# Patient Record
Sex: Male | Born: 1970
Health system: Southern US, Community
[De-identification: ages and names within clinical notes are randomized; demographics above are authoritative.]

## PROBLEM LIST (undated history)

## (undated) DIAGNOSIS — I639 Cerebral infarction, unspecified: Secondary | ICD-10-CM

## (undated) DIAGNOSIS — M6281 Muscle weakness (generalized): Secondary | ICD-10-CM

## (undated) DIAGNOSIS — E785 Hyperlipidemia, unspecified: Secondary | ICD-10-CM

## (undated) DIAGNOSIS — T7840XA Allergy, unspecified, initial encounter: Secondary | ICD-10-CM

## (undated) DIAGNOSIS — I10 Essential (primary) hypertension: Secondary | ICD-10-CM

## (undated) HISTORY — DX: Hyperlipidemia, unspecified: E78.5

## (undated) HISTORY — PX: KNEE SURGERY: SHX244

## (undated) HISTORY — DX: Muscle weakness (generalized): M62.81

## (undated) HISTORY — DX: Allergy, unspecified, initial encounter: T78.40XA

## (undated) HISTORY — PX: COLONOSCOPY: SHX174

---

## 1998-06-18 ENCOUNTER — Emergency Department (HOSPITAL_COMMUNITY): Admission: EM | Admit: 1998-06-18 | Discharge: 1998-06-18 | Payer: Self-pay | Admitting: Emergency Medicine

## 1998-06-18 ENCOUNTER — Encounter: Payer: Self-pay | Admitting: Emergency Medicine

## 1999-11-26 ENCOUNTER — Emergency Department (HOSPITAL_COMMUNITY): Admission: EM | Admit: 1999-11-26 | Discharge: 1999-11-26 | Payer: Self-pay | Admitting: Emergency Medicine

## 2000-05-31 ENCOUNTER — Emergency Department (HOSPITAL_COMMUNITY): Admission: EM | Admit: 2000-05-31 | Discharge: 2000-05-31 | Payer: Self-pay | Admitting: Emergency Medicine

## 2001-06-05 ENCOUNTER — Emergency Department (HOSPITAL_COMMUNITY): Admission: EM | Admit: 2001-06-05 | Discharge: 2001-06-05 | Payer: Self-pay | Admitting: Emergency Medicine

## 2001-11-29 ENCOUNTER — Emergency Department (HOSPITAL_COMMUNITY): Admission: EM | Admit: 2001-11-29 | Discharge: 2001-11-29 | Payer: Self-pay | Admitting: Emergency Medicine

## 2002-06-25 ENCOUNTER — Emergency Department (HOSPITAL_COMMUNITY): Admission: EM | Admit: 2002-06-25 | Discharge: 2002-06-25 | Payer: Self-pay | Admitting: Emergency Medicine

## 2005-08-17 ENCOUNTER — Emergency Department (HOSPITAL_COMMUNITY): Admission: EM | Admit: 2005-08-17 | Discharge: 2005-08-17 | Payer: Self-pay | Admitting: Emergency Medicine

## 2005-09-19 ENCOUNTER — Emergency Department (HOSPITAL_COMMUNITY): Admission: EM | Admit: 2005-09-19 | Discharge: 2005-09-19 | Payer: Self-pay | Admitting: Emergency Medicine

## 2005-10-02 ENCOUNTER — Encounter: Admission: RE | Admit: 2005-10-02 | Discharge: 2005-12-31 | Payer: Self-pay | Admitting: Neurosurgery

## 2007-04-24 ENCOUNTER — Emergency Department (HOSPITAL_COMMUNITY): Admission: EM | Admit: 2007-04-24 | Discharge: 2007-04-24 | Payer: Self-pay | Admitting: Emergency Medicine

## 2007-05-10 ENCOUNTER — Emergency Department (HOSPITAL_COMMUNITY): Admission: EM | Admit: 2007-05-10 | Discharge: 2007-05-10 | Payer: Self-pay | Admitting: Emergency Medicine

## 2008-03-02 ENCOUNTER — Emergency Department (HOSPITAL_COMMUNITY): Admission: EM | Admit: 2008-03-02 | Discharge: 2008-03-02 | Payer: Self-pay | Admitting: Emergency Medicine

## 2008-07-27 ENCOUNTER — Emergency Department (HOSPITAL_COMMUNITY): Admission: EM | Admit: 2008-07-27 | Discharge: 2008-07-27 | Payer: Self-pay | Admitting: Family Medicine

## 2008-07-30 ENCOUNTER — Emergency Department (HOSPITAL_COMMUNITY): Admission: EM | Admit: 2008-07-30 | Discharge: 2008-07-30 | Payer: Self-pay | Admitting: Family Medicine

## 2008-08-24 ENCOUNTER — Emergency Department (HOSPITAL_COMMUNITY): Admission: EM | Admit: 2008-08-24 | Discharge: 2008-08-24 | Payer: Self-pay | Admitting: Family Medicine

## 2008-10-13 ENCOUNTER — Emergency Department (HOSPITAL_COMMUNITY): Admission: EM | Admit: 2008-10-13 | Discharge: 2008-10-13 | Payer: Self-pay | Admitting: Family Medicine

## 2009-01-11 ENCOUNTER — Emergency Department (HOSPITAL_COMMUNITY): Admission: EM | Admit: 2009-01-11 | Discharge: 2009-01-11 | Payer: Self-pay | Admitting: Family Medicine

## 2009-06-02 ENCOUNTER — Emergency Department (HOSPITAL_COMMUNITY): Admission: EM | Admit: 2009-06-02 | Discharge: 2009-06-02 | Payer: Self-pay | Admitting: Emergency Medicine

## 2009-09-15 ENCOUNTER — Emergency Department (HOSPITAL_COMMUNITY): Admission: EM | Admit: 2009-09-15 | Discharge: 2009-09-15 | Payer: Self-pay | Admitting: Family Medicine

## 2010-02-09 ENCOUNTER — Emergency Department (HOSPITAL_COMMUNITY): Admission: EM | Admit: 2010-02-09 | Discharge: 2010-02-09 | Payer: Self-pay | Admitting: Family Medicine

## 2010-04-24 ENCOUNTER — Emergency Department (HOSPITAL_COMMUNITY): Admission: EM | Admit: 2010-04-24 | Discharge: 2010-04-24 | Payer: Self-pay | Admitting: Emergency Medicine

## 2010-05-13 ENCOUNTER — Emergency Department (HOSPITAL_COMMUNITY): Admission: EM | Admit: 2010-05-13 | Discharge: 2010-05-13 | Payer: Self-pay | Admitting: Family Medicine

## 2010-05-15 ENCOUNTER — Emergency Department (HOSPITAL_COMMUNITY): Admission: EM | Admit: 2010-05-15 | Discharge: 2010-05-15 | Payer: Self-pay | Admitting: Family Medicine

## 2010-06-03 ENCOUNTER — Emergency Department (HOSPITAL_COMMUNITY): Admission: EM | Admit: 2010-06-03 | Discharge: 2010-06-03 | Payer: Self-pay | Admitting: Emergency Medicine

## 2010-06-05 ENCOUNTER — Emergency Department (HOSPITAL_COMMUNITY): Admission: EM | Admit: 2010-06-05 | Discharge: 2010-06-05 | Payer: Self-pay | Admitting: Emergency Medicine

## 2010-06-10 ENCOUNTER — Emergency Department (HOSPITAL_COMMUNITY): Admission: EM | Admit: 2010-06-10 | Discharge: 2010-06-10 | Payer: Self-pay | Admitting: Emergency Medicine

## 2010-06-12 ENCOUNTER — Observation Stay (HOSPITAL_COMMUNITY): Admission: AD | Admit: 2010-06-12 | Discharge: 2010-06-13 | Payer: Self-pay | Admitting: Orthopaedic Surgery

## 2010-11-07 ENCOUNTER — Emergency Department (HOSPITAL_COMMUNITY)
Admission: EM | Admit: 2010-11-07 | Discharge: 2010-11-07 | Disposition: A | Discharge status: Home / Self Care | Payer: Medicare Other | Attending: Emergency Medicine | Admitting: Emergency Medicine

## 2010-11-07 DIAGNOSIS — M25529 Pain in unspecified elbow: Secondary | ICD-10-CM | POA: Insufficient documentation

## 2010-11-07 DIAGNOSIS — M109 Gout, unspecified: Secondary | ICD-10-CM | POA: Insufficient documentation

## 2010-11-07 DIAGNOSIS — M25579 Pain in unspecified ankle and joints of unspecified foot: Secondary | ICD-10-CM | POA: Insufficient documentation

## 2010-11-07 DIAGNOSIS — M25539 Pain in unspecified wrist: Secondary | ICD-10-CM | POA: Insufficient documentation

## 2010-11-07 DIAGNOSIS — R29898 Other symptoms and signs involving the musculoskeletal system: Secondary | ICD-10-CM | POA: Insufficient documentation

## 2010-11-07 DIAGNOSIS — I10 Essential (primary) hypertension: Secondary | ICD-10-CM | POA: Insufficient documentation

## 2010-11-08 LAB — WOUND CULTURE: Culture: NO GROWTH

## 2010-11-08 LAB — BASIC METABOLIC PANEL
BUN: 14 mg/dL (ref 6–23)
CO2: 24 mEq/L (ref 19–32)
Calcium: 9.8 mg/dL (ref 8.4–10.5)
Chloride: 104 mEq/L (ref 96–112)
Creatinine, Ser: 1.32 mg/dL (ref 0.4–1.5)
GFR calc Af Amer: >60 mL/min (ref >60)
GFR calc non Af Amer: >60 mL/min (ref >60)
Glucose, Bld: 94 mg/dL (ref 70–99)
Potassium: 4.4 mEq/L (ref 3.5–5.1)
Sodium: 137 mEq/L (ref 135–145)

## 2010-11-08 LAB — SYNOVIAL CELL COUNT + DIFF, W/ CRYSTALS
Lymphocytes-Synovial Fld: 3 % (ref 0–20)
Monocyte-Macrophage-Synovial Fluid: 2 % — ABNORMAL LOW (ref 50–90)
Neutrophil, Synovial: 95 % — ABNORMAL HIGH (ref 0–25)

## 2010-11-08 LAB — BODY FLUID CULTURE: Culture: NO GROWTH

## 2010-11-08 LAB — CBC
HCT: 44.3 % (ref 39.0–52.0)
Hemoglobin: 14.9 g/dL (ref 13.0–17.0)
MCH: 29.2 pg (ref 26.0–34.0)
MCHC: 33.6 g/dL (ref 30.0–36.0)
MCV: 86.7 fL (ref 78.0–100.0)
Platelets: 525 10*3/uL — ABNORMAL HIGH (ref 150–400)
RBC: 5.11 MIL/uL (ref 4.22–5.81)
RDW: 14 % (ref 11.5–15.5)
WBC: 8.9 10*3/uL (ref 4.0–10.5)

## 2010-11-08 LAB — ANAEROBIC CULTURE

## 2010-11-08 LAB — SURGICAL PCR SCREEN
MRSA, PCR: NEGATIVE
Staphylococcus aureus: NEGATIVE

## 2010-11-08 LAB — PROTIME-INR
INR: 1.04 (ref 0.00–1.49)
Prothrombin Time: 13.8 seconds (ref 11.6–15.2)

## 2010-11-08 LAB — SEDIMENTATION RATE: Sed Rate: 37 mm/hr — ABNORMAL HIGH (ref 0–16)

## 2010-11-08 LAB — DIFFERENTIAL
Basophils Absolute: 0 10*3/uL (ref 0.0–0.1)
Basophils Relative: 0 % (ref 0–1)
Eosinophils Absolute: 0.1 10*3/uL (ref 0.0–0.7)
Eosinophils Relative: 1 % (ref 0–5)
Lymphocytes Relative: 22 % (ref 12–46)
Lymphs Abs: 1.9 10*3/uL (ref 0.7–4.0)
Monocytes Absolute: 0.4 10*3/uL (ref 0.1–1.0)
Monocytes Relative: 5 % (ref 3–12)
Neutro Abs: 6.4 10*3/uL (ref 1.7–7.7)
Neutrophils Relative %: 72 % (ref 43–77)

## 2010-11-08 LAB — GRAM STAIN

## 2010-11-08 LAB — C-REACTIVE PROTEIN: CRP: 9.1 mg/dL — ABNORMAL HIGH (ref <0.6)

## 2010-11-08 LAB — URIC ACID: Uric Acid, Serum: 7.5 mg/dL (ref 4.0–7.8)

## 2010-11-09 LAB — CULTURE, ROUTINE-ABSCESS: Culture: NO GROWTH

## 2011-04-13 ENCOUNTER — Emergency Department (HOSPITAL_COMMUNITY)
Admission: EM | Admit: 2011-04-13 | Discharge: 2011-04-13 | Disposition: A | Discharge status: Home / Self Care | Payer: Medicare Other | Attending: Emergency Medicine | Admitting: Emergency Medicine

## 2011-04-13 DIAGNOSIS — I10 Essential (primary) hypertension: Secondary | ICD-10-CM | POA: Insufficient documentation

## 2011-04-13 DIAGNOSIS — Z79899 Other long term (current) drug therapy: Secondary | ICD-10-CM | POA: Insufficient documentation

## 2011-04-13 DIAGNOSIS — Z862 Personal history of diseases of the blood and blood-forming organs and certain disorders involving the immune mechanism: Secondary | ICD-10-CM | POA: Insufficient documentation

## 2011-04-13 DIAGNOSIS — Z8639 Personal history of other endocrine, nutritional and metabolic disease: Secondary | ICD-10-CM | POA: Insufficient documentation

## 2011-04-13 DIAGNOSIS — L988 Other specified disorders of the skin and subcutaneous tissue: Secondary | ICD-10-CM | POA: Insufficient documentation

## 2011-05-31 LAB — WOUND CULTURE: Culture: NO GROWTH

## 2011-06-08 LAB — URIC ACID: Uric Acid, Serum: 11.2 — ABNORMAL HIGH

## 2011-09-02 DIAGNOSIS — B079 Viral wart, unspecified: Secondary | ICD-10-CM | POA: Diagnosis not present

## 2011-09-04 DIAGNOSIS — D485 Neoplasm of uncertain behavior of skin: Secondary | ICD-10-CM | POA: Diagnosis not present

## 2011-09-13 ENCOUNTER — Emergency Department (HOSPITAL_COMMUNITY)
Admission: EM | Admit: 2011-09-13 | Discharge: 2011-09-13 | Disposition: A | Discharge status: Home / Self Care | Payer: Medicare Other | Attending: Emergency Medicine | Admitting: Emergency Medicine

## 2011-09-13 ENCOUNTER — Encounter (HOSPITAL_COMMUNITY): Payer: Self-pay | Admitting: Emergency Medicine

## 2011-09-13 DIAGNOSIS — M545 Low back pain, unspecified: Secondary | ICD-10-CM | POA: Insufficient documentation

## 2011-09-13 DIAGNOSIS — I10 Essential (primary) hypertension: Secondary | ICD-10-CM | POA: Insufficient documentation

## 2011-09-13 DIAGNOSIS — M1A9XX1 Chronic gout, unspecified, with tophus (tophi): Secondary | ICD-10-CM | POA: Diagnosis not present

## 2011-09-13 HISTORY — DX: Essential (primary) hypertension: I10

## 2011-09-13 MED ORDER — IBUPROFEN 800 MG PO TABS: 800.0000 mg | ORAL_TABLET | Freq: Three times a day (TID) | ORAL | Status: AC

## 2011-09-13 MED ORDER — CYCLOBENZAPRINE HCL 10 MG PO TABS: 10.0000 mg | ORAL_TABLET | Freq: Two times a day (BID) | ORAL | Status: AC | PRN

## 2011-09-13 NOTE — ED Provider Notes (Signed)
History     CSN: [id number]  Arrival date & time 09/13/11  1147   First MD Initiated Contact with Patient 09/13/11 1152      Chief Complaint  Patient presents with  . Back Pain    (Consider location/radiation/quality/duration/timing/severity/associated sxs/prior treatment) HPI Comments: Patient presents complaining of left lumbar back pain that he believes is a spasm.  He has had this before but not recently.  He notes no new injuries or trauma.  He has no radiation of the pain to his legs.  No weakness or numbness in his lower extremities.  No complaints of bladder or bowel incontinence.  No fevers.  Patient has not been taking any pain medications at home for this presents for further management here today.  He denies any associated abdominal pain, nausea, vomiting, changes in bowel habits, shortness of breath or other concerns.  No History of kidney stones  Patient is a 41 y.o. male presenting with back pain. The history is provided by the patient. No language interpreter was used.  Back Pain  This is a recurrent problem. The current episode started yesterday. The problem occurs constantly. The problem has not changed since onset.The pain is associated with no known injury. The pain is present in the lumbar spine. The quality of the pain is described as shooting. The pain does not radiate. The pain is moderate. The symptoms are aggravated by bending and certain positions. Pertinent negatives include no chest pain, no fever, no headaches and no abdominal pain.    Past Medical History  Diagnosis Date  . Hypertension   . Gout     Past Surgical History  Procedure Date  . Knee surgery     No family history on file.  History  Substance Use Topics  . Smoking status: Never Smoker   . Smokeless tobacco: Not on file  . Alcohol Use:       Review of Systems  Constitutional: Negative.  Negative for fever and chills.  HENT: Negative.   Eyes: Negative.  Negative for discharge and  redness.  Respiratory: Negative.  Negative for cough and shortness of breath.   Cardiovascular: Negative.  Negative for chest pain.  Gastrointestinal: Negative.  Negative for nausea, vomiting and abdominal pain.  Genitourinary: Negative.  Negative for hematuria.  Musculoskeletal: Positive for back pain.  Skin: Negative.  Negative for color change and rash.  Neurological: Negative for syncope and headaches.  Hematological: Negative.  Negative for adenopathy.  Psychiatric/Behavioral: Negative.  Negative for confusion.  All other systems reviewed and are negative.    Allergies  Review of patient's allergies indicates no known allergies.  Home Medications  No current outpatient prescriptions on file.  BP 191/129  Pulse 113  Temp(Src) 97.8 F (36.6 C) (Oral)  Resp 18  Ht 6\' 1"  (1.854 m)  Wt 262 lb (118.842 kg)  BMI 34.57 kg/m2  SpO2 97%  Physical Exam  Nursing note and vitals reviewed. Constitutional: He is oriented to person, place, and time. He appears well-developed and well-nourished.  Non-toxic appearance. He does not have a sickly appearance.  HENT:  Head: Normocephalic and atraumatic.  Eyes: Conjunctivae, EOM and lids are normal. Pupils are equal, round, and reactive to light.  Neck: Trachea normal, normal range of motion and full passive range of motion without pain. Neck supple.  Cardiovascular: Normal rate, regular rhythm and normal heart sounds.   Pulmonary/Chest: Effort normal and breath sounds normal. No respiratory distress.  Abdominal: Soft. Normal appearance. He exhibits no distension.  There is no tenderness. There is no rebound and no CVA tenderness.  Musculoskeletal: Normal range of motion.       No focal T-spine or L-spine tenderness on exam.  Patient has mild tenderness to palpation over his left paraspinal muscles in the lumbar region.  Patient has normal ambulation witnessed here in the emergency department.  Neurological: He is alert and oriented to person,  place, and time. He has normal strength.  Skin: Skin is warm, dry and intact. No rash noted.  Psychiatric: He has a normal mood and affect. His behavior is normal. Judgment and thought content normal.    ED Course  Procedures (including critical care time)  Labs Reviewed - No data to display No results found.   No diagnosis found.    MDM  Patient with likely musculoskeletal back pain given his location of his pain and lack of other associated symptoms.  Treat the patient with anti-inflammatories and muscle relaxants.  He has no neurologic deficits.  No bladder or bowel incontinence to indicate further spinal cord involvement.  His pain is not midline and  he has no trauma to be concerned for fractures.  No urinary symptoms to suggest kidney stone.  Patient has known history of high blood pressure as well and states he took his medicine today but has not been taking it consistently since he did not take it yesterday.  Patient does have a primary care physician Dr. Leonor Liv who he has scheduled followup with in March and I have advised the patient that he should followup with his primary care physician regarding his blood pressure.        Nat Christen, MD 09/13/11 [phone]

## 2011-09-13 NOTE — ED Notes (Signed)
Back pain since yesterday, denies injuries

## 2011-10-11 DIAGNOSIS — Z79899 Other long term (current) drug therapy: Secondary | ICD-10-CM | POA: Diagnosis not present

## 2011-11-15 ENCOUNTER — Encounter (HOSPITAL_COMMUNITY): Payer: Self-pay | Admitting: Emergency Medicine

## 2011-11-15 ENCOUNTER — Emergency Department (HOSPITAL_COMMUNITY): Payer: Medicare Other

## 2011-11-15 ENCOUNTER — Emergency Department (HOSPITAL_COMMUNITY)
Admission: EM | Admit: 2011-11-15 | Discharge: 2011-11-15 | Disposition: A | Discharge status: Home Health | Payer: Medicare Other | Attending: Emergency Medicine | Admitting: Emergency Medicine

## 2011-11-15 DIAGNOSIS — I10 Essential (primary) hypertension: Secondary | ICD-10-CM | POA: Insufficient documentation

## 2011-11-15 DIAGNOSIS — S40019A Contusion of unspecified shoulder, initial encounter: Secondary | ICD-10-CM | POA: Diagnosis not present

## 2011-11-15 DIAGNOSIS — IMO0002 Reserved for concepts with insufficient information to code with codable children: Secondary | ICD-10-CM | POA: Insufficient documentation

## 2011-11-15 DIAGNOSIS — S40012A Contusion of left shoulder, initial encounter: Secondary | ICD-10-CM

## 2011-11-15 DIAGNOSIS — M25519 Pain in unspecified shoulder: Secondary | ICD-10-CM | POA: Diagnosis not present

## 2011-11-15 MED ORDER — NAPROXEN 500 MG PO TABS: 500.0000 mg | ORAL_TABLET | Freq: Two times a day (BID) | ORAL | Status: DC | PRN

## 2011-11-15 MED ORDER — IBUPROFEN 800 MG PO TABS
800.0000 mg | ORAL_TABLET | Freq: Once | ORAL | Status: AC
  Administered 2011-11-15: 800 mg via ORAL
  Filled 2011-11-15: qty 1

## 2011-11-15 NOTE — ED Notes (Signed)
Pt presents to department for evaluation of L shoulder pain. Pt states he ran into object in kitchen several days ago. Bruising noted to area. No obvious deformity noted. 8/10 pain at the time, becomes worse with movement. He is alert and oriented x4. No signs of distress noted at present.

## 2011-11-15 NOTE — ED Provider Notes (Signed)
History     CSN: [id number]  Arrival date & time 11/15/11  0846   First MD Initiated Contact with Patient 11/15/11 [phone]      Chief Complaint  Patient presents with  . Shoulder Pain    (Consider location/radiation/quality/duration/timing/severity/associated sxs/prior treatment) HPI Comments: Patient reports he was walking through a dark room two nights ago and ran into the door with his left shoulder.  Reports pain over his anterior shoulder and decreased range of motion secondary to pain.  Pain is exacerbated by moving his arm and palpation.  Patient took 1 200mg  ibuprofen last night without relief. Denies other injury, denies falling or LOC.    Patient is a 41 y.o. male presenting with shoulder pain. The history is provided by the patient.  Shoulder Pain Pertinent negatives include no chest pain, numbness or weakness.    Past Medical History  Diagnosis Date  . Hypertension   . Gout     Past Surgical History  Procedure Date  . Knee surgery     No family history on file.  History  Substance Use Topics  . Smoking status: Never Smoker   . Smokeless tobacco: Not on file  . Alcohol Use: No      Review of Systems  Respiratory: Negative for shortness of breath.   Cardiovascular: Negative for chest pain.  Neurological: Negative for syncope, weakness and numbness.  All other systems reviewed and are negative.    Allergies  Review of patient's allergies indicates no known allergies.  Home Medications   Current Outpatient Rx  Name Route Sig Dispense Refill  . AMLODIPINE-OLMESARTAN 10-40 MG PO TABS Oral Take 1 tablet by mouth daily.    . FEBUXOSTAT 40 MG PO TABS Oral Take 40 mg by mouth daily.      BP 147/106  Pulse 100  Temp(Src) 97.6 F (36.4 C) (Oral)  Resp 18  SpO2 100%  Physical Exam  Nursing note and vitals reviewed. Constitutional: He is oriented to person, place, and time. He appears well-developed and well-nourished.  HENT:  Head: Normocephalic  and atraumatic.  Neck: Neck supple.  Pulmonary/Chest: Effort normal.  Musculoskeletal:       Left shoulder: He exhibits decreased range of motion, tenderness, bony tenderness and pain. He exhibits no crepitus, no deformity and normal pulse.       Arms: Neurological: He is alert and oriented to person, place, and time.    ED Course  Procedures (including critical care time)  Labs Reviewed - No data to display Dg Shoulder Left  11/15/2011  *RADIOLOGY REPORT*  Clinical Data: History of injury and pain.  LEFT SHOULDER - 2+ VIEW  Comparison: None.  Findings: There is no evidence of fracture or dislocation.  There is slight superior subluxation of the distal clavicle in relation to the acromion process.  However no dislocation or abnormal AC joint space widening is seen on the AP images. There appears be very slight degenerative spurring of the inferior aspect of glenoid.  IMPRESSION: No fracture or dislocation evident.  Slight superior subluxation of distal clavicle at the [facility] joint.  This could be chronic or acute. However no widening of the [facility] joint is evident.  No dislocation is seen.  Minimal degenerative spurring.  Original Report Authenticated By: Crawford Givens, M.D.    11:03 AM Patient declines arm sling.  States that he has an old football injury of the left shoulder, this likely explains the slight subluxation on xray.    1. Contusion of left  shoulder       MDM  Patient with contusion of left anterior shoulder following walking into door frame in the dark.  Pt with chronic pain from remote injury.  Xray with slight subluxation distal clavicle at [facility] joint - suspect this is chronic.  Patient underdosed his ibuprofen last night.  Pt declines sling.  D/C home with properly dosed NSAID, pcp follow up.  Patient verbalizes understanding and agrees with plan.          Rise Patience, [place] 11/15/11 1158

## 2011-11-15 NOTE — ED Notes (Signed)
Discharge instructions reviewed with pt; verbalizes understanding.  No questions asked; no further c/o's voiced.  Pt ambulatory to lobby.  NAD noted. 

## 2011-11-15 NOTE — ED Provider Notes (Signed)
Medical screening examination/treatment/procedure(s) were performed by non-physician practitioner and as supervising physician I was immediately available for consultation/collaboration.   Benny Lennert, MD 11/15/11 1444

## 2011-11-15 NOTE — Discharge Instructions (Signed)
Please follow up with your primary care provider if you continue to have pain.  Follow the instructions below.  You may return to the ER at any time for worsening condition or any new symptoms that concern you.   Contusion A contusion is a deep bruise. Contusions happen when an injury causes bleeding under the skin. Signs of bruising include pain, puffiness (swelling), and discolored skin. The contusion may turn blue, purple, or yellow. HOME CARE   Put ice on the injured area.   Put ice in a plastic bag.   Place a towel between your skin and the bag.   Leave the ice on for 15 to 20 minutes, 3 to 4 times a day.   Only take medicine as told by your doctor.   Rest the injured area.   If possible, raise (elevate) the injured area to lessen puffiness.  GET HELP RIGHT AWAY IF:   You have more bruising or puffiness.   You have pain that is getting worse.   Your puffiness or pain is not helped by medicine.  MAKE SURE YOU:   Understand these instructions.   Will watch your condition.   Will get help right away if you are not doing well or get worse.  Document Released: 01/30/2008 Document Revised: 08/02/2011 Document Reviewed: 06/18/2011 [facility] Patient Information 2012 [place], [place].

## 2011-11-15 NOTE — ED Notes (Signed)
Hit left shoulder on door jam on Tuesday hurts to move certain ways now has good pulse and cap refill color can move hand

## 2011-11-19 DIAGNOSIS — T1490XA Injury, unspecified, initial encounter: Secondary | ICD-10-CM | POA: Diagnosis not present

## 2011-12-17 DIAGNOSIS — L0292 Furuncle, unspecified: Secondary | ICD-10-CM | POA: Diagnosis not present

## 2011-12-17 DIAGNOSIS — L0293 Carbuncle, unspecified: Secondary | ICD-10-CM | POA: Diagnosis not present

## 2012-01-03 DIAGNOSIS — D485 Neoplasm of uncertain behavior of skin: Secondary | ICD-10-CM | POA: Diagnosis not present

## 2012-01-31 DIAGNOSIS — D485 Neoplasm of uncertain behavior of skin: Secondary | ICD-10-CM | POA: Diagnosis not present

## 2012-05-10 ENCOUNTER — Emergency Department (HOSPITAL_COMMUNITY)
Admission: EM | Admit: 2012-05-10 | Discharge: 2012-05-11 | Disposition: A | Discharge status: Home / Self Care | Payer: Medicare Other | Attending: Emergency Medicine | Admitting: Emergency Medicine

## 2012-05-10 ENCOUNTER — Encounter (HOSPITAL_COMMUNITY): Payer: Self-pay | Admitting: *Deleted

## 2012-05-10 DIAGNOSIS — S91339A Puncture wound without foreign body, unspecified foot, initial encounter: Secondary | ICD-10-CM

## 2012-05-10 DIAGNOSIS — Y9289 Other specified places as the place of occurrence of the external cause: Secondary | ICD-10-CM | POA: Insufficient documentation

## 2012-05-10 DIAGNOSIS — S91309A Unspecified open wound, unspecified foot, initial encounter: Secondary | ICD-10-CM | POA: Diagnosis not present

## 2012-05-10 DIAGNOSIS — M109 Gout, unspecified: Secondary | ICD-10-CM | POA: Insufficient documentation

## 2012-05-10 DIAGNOSIS — W268XXA Contact with other sharp object(s), not elsewhere classified, initial encounter: Secondary | ICD-10-CM | POA: Insufficient documentation

## 2012-05-10 DIAGNOSIS — I10 Essential (primary) hypertension: Secondary | ICD-10-CM | POA: Insufficient documentation

## 2012-05-10 DIAGNOSIS — Y998 Other external cause status: Secondary | ICD-10-CM | POA: Insufficient documentation

## 2012-05-10 DIAGNOSIS — Y9301 Activity, walking, marching and hiking: Secondary | ICD-10-CM | POA: Insufficient documentation

## 2012-05-10 NOTE — ED Notes (Signed)
The pt has ha puncture wound to the bottom of his rt foot he stepped on a nail in his yard earlier today through his boot

## 2012-05-11 ENCOUNTER — Emergency Department (HOSPITAL_COMMUNITY): Payer: Medicare Other

## 2012-05-11 DIAGNOSIS — S91309A Unspecified open wound, unspecified foot, initial encounter: Secondary | ICD-10-CM | POA: Diagnosis not present

## 2012-05-11 DIAGNOSIS — I10 Essential (primary) hypertension: Secondary | ICD-10-CM | POA: Diagnosis not present

## 2012-05-11 DIAGNOSIS — M109 Gout, unspecified: Secondary | ICD-10-CM | POA: Diagnosis not present

## 2012-05-11 MED ORDER — HYDROCODONE-ACETAMINOPHEN 5-325 MG PO TABS
1.0000 | ORAL_TABLET | Freq: Once | ORAL | Status: AC
  Administered 2012-05-11: 1 via ORAL
  Filled 2012-05-11: qty 1

## 2012-05-11 MED ORDER — HYDROCODONE-ACETAMINOPHEN 5-325 MG PO TABS: 1.0000 | ORAL_TABLET | ORAL | Status: DC | PRN

## 2012-05-11 MED ORDER — AMOXICILLIN-POT CLAVULANATE 875-125 MG PO TABS: 1.0000 | ORAL_TABLET | Freq: Two times a day (BID) | ORAL | Status: DC

## 2012-05-11 MED ORDER — TETANUS-DIPHTH-ACELL PERTUSSIS 5-2.5-18.5 LF-MCG/0.5 IM SUSP
0.5000 mL | Freq: Once | INTRAMUSCULAR | Status: AC
  Administered 2012-05-11: 0.5 mL via INTRAMUSCULAR
  Filled 2012-05-11: qty 0.5

## 2012-05-11 NOTE — ED Provider Notes (Signed)
History     CSN: [id number]  Arrival date & time 05/10/12  2147   First MD Initiated Contact with Patient 05/10/12 2353      Chief Complaint  Patient presents with  . puncture wound    HPI  History provided by the patient. Patient is a 41 year old African American male with history of hypertension and gout who presents with complaints of puncture wound to right foot. Patient states she was walking outside and stepped on a board with a nail through it around 10 or 11 PM. Patient states nail went through his boot and into his right foot. Since that time he has had severe pain and swelling to the area. There was a small amount of bleeding but this stopped shortly. Patient denies any numbness to the foot. Patient is unsure of his last tetanus shot.     Past Medical History  Diagnosis Date  . Hypertension   . Gout     Past Surgical History  Procedure Date  . Knee surgery     No family history on file.  History  Substance Use Topics  . Smoking status: Never Smoker   . Smokeless tobacco: Not on file  . Alcohol Use: No      Review of Systems  Musculoskeletal:       Puncture wound to foot  Neurological: Negative for weakness and numbness.    Allergies  Review of patient's allergies indicates no known allergies.  Home Medications   Current Outpatient Rx  Name Route Sig Dispense Refill  . AMLODIPINE-OLMESARTAN 10-40 MG PO TABS Oral Take 1 tablet by mouth daily.    . FEBUXOSTAT 40 MG PO TABS Oral Take 40 mg by mouth daily.      BP 150/90  Pulse 111  Temp 98.3 F (36.8 C) (Oral)  Resp 18  SpO2 96%  Physical Exam  Nursing note and vitals reviewed. Constitutional: He appears well-developed and well-nourished.  HENT:  Head: Normocephalic.  Cardiovascular: Normal rate and regular rhythm.   Pulmonary/Chest: Effort normal and breath sounds normal.  Abdominal: Soft.  Musculoskeletal:       Right foot: He exhibits tenderness and swelling.       Feet:   Small puncture wound to the sole of the right foot consistent with history of stepping on a nail. There is diffuse tenderness around this area and mild to moderate swelling. No significant erythema of skin. No foreign bodies palpated or seen. No active bleeding. Normal movement in toes and foot. Normal sensation and cap refill in toes.    ED Course  Procedures   Dg Foot Complete Right  05/11/2012  *RADIOLOGY REPORT*  Clinical Data: Puncture wound plantar right foot.  RIGHT FOOT COMPLETE - 3+ VIEW  Comparison: 09/08/2010  Findings: Unchanged hallux valgus deformity and DJD first MTP. Lisfranc joint intact.  No acute fracture or dislocation.  Midfoot DJD.  No radiopaque foreign body identified.  IMPRESSION: First MTP and midfoot DJD.  No acute osseous abnormality or radiopaque foreign body identified.   Original Report Authenticated By: Waneta Martins, M.D.      1. Puncture wound to foot       MDM  12:30 AM patient seen and evaluated. Patient with puncture wound to right mid foot near the heel. Area is swollen and very tender. Will obtain x-ray to rule out any foreign body or bony injury.        Angus Seller, PA 05/11/12 0157

## 2012-05-12 NOTE — ED Provider Notes (Signed)
Medical screening examination/treatment/procedure(s) were performed by non-physician practitioner and as supervising physician I was immediately available for consultation/collaboration.    Brian D Miller, MD 05/12/12 1500 

## 2012-05-17 ENCOUNTER — Encounter (HOSPITAL_COMMUNITY): Payer: Self-pay | Admitting: *Deleted

## 2012-05-17 ENCOUNTER — Emergency Department (HOSPITAL_COMMUNITY)
Admission: EM | Admit: 2012-05-17 | Discharge: 2012-05-17 | Disposition: A | Discharge status: Home / Self Care | Payer: Medicare Other | Attending: Emergency Medicine | Admitting: Emergency Medicine

## 2012-05-17 DIAGNOSIS — I10 Essential (primary) hypertension: Secondary | ICD-10-CM | POA: Insufficient documentation

## 2012-05-17 DIAGNOSIS — S93609A Unspecified sprain of unspecified foot, initial encounter: Secondary | ICD-10-CM | POA: Insufficient documentation

## 2012-05-17 DIAGNOSIS — M109 Gout, unspecified: Secondary | ICD-10-CM | POA: Insufficient documentation

## 2012-05-17 DIAGNOSIS — X58XXXA Exposure to other specified factors, initial encounter: Secondary | ICD-10-CM | POA: Insufficient documentation

## 2012-05-17 DIAGNOSIS — S93601A Unspecified sprain of right foot, initial encounter: Secondary | ICD-10-CM

## 2012-05-17 MED ORDER — TRAMADOL HCL 50 MG PO TABS
50.0000 mg | ORAL_TABLET | Freq: Once | ORAL | Status: AC
  Administered 2012-05-17: 50 mg via ORAL
  Filled 2012-05-17: qty 1

## 2012-05-17 MED ORDER — IBUPROFEN 800 MG PO TABS: 800.0000 mg | ORAL_TABLET | Freq: Three times a day (TID) | ORAL | Status: DC | PRN

## 2012-05-17 MED ORDER — TRAMADOL HCL 50 MG PO TABS: 50.0000 mg | ORAL_TABLET | Freq: Four times a day (QID) | ORAL | Status: DC | PRN

## 2012-05-17 MED ORDER — IBUPROFEN 400 MG PO TABS
800.0000 mg | ORAL_TABLET | Freq: Once | ORAL | Status: AC
  Administered 2012-05-17: 800 mg via ORAL
  Filled 2012-05-17: qty 2

## 2012-05-17 NOTE — ED Notes (Signed)
Patient stepped on a nail in his right foot.  He was seen and tx for same.  He has had increased pain up into his leg.  Patient states he cannot hardly walk due to pain.

## 2012-05-17 NOTE — ED Provider Notes (Signed)
History  This chart was scribed for Randall Numbers, MD by Randall Mccall. The patient was seen in room TR04C/TR04C. Patient's care was started at 1251.     CSN: [id number]  Arrival date & time 05/17/12  1150   First MD Initiated Contact with Patient 05/17/12 1251      Chief Complaint  Patient presents with  . Foot Pain    The history is provided by the patient. No language interpreter was used.    Randall Mccall is a 41 y.o. male who presents to the Emergency Department complaining of constant, moderate pain to dorsum of the foot onset a few days ago. Patient states that he may have twisted his foot. He says that walking aggravates the pain and is concerned the pain is related to gout. Patient is not reporting any other symptoms at this time. Patient was seen here last Saturday after stepping on a nail. Patient was given Tdap, Augmentin and Hydrocone. Patient says that he finished his antibiotic course. His X-ray was negative for foreign bodies or deformities. Patient states that the pain related to his puncture wound is resolved. Patient has a history of gout and HTN. He has never smoked.  Past Medical History  Diagnosis Date  . Hypertension   . Gout     Past Surgical History  Procedure Date  . Knee surgery     History reviewed. No pertinent family history.  History  Substance Use Topics  . Smoking status: Never Smoker   . Smokeless tobacco: Not on file  . Alcohol Use: No      Review of Systems  Constitutional: Negative.   HENT: Negative.   Eyes: Negative.   Respiratory: Negative.   Cardiovascular: Negative.   Gastrointestinal: Negative.   Genitourinary: Negative.   Musculoskeletal: Positive for myalgias.  Neurological: Negative.   Hematological: Negative.   Psychiatric/Behavioral: Negative.     Allergies  Review of patient's allergies indicates no known allergies.  Home Medications   Current Outpatient Rx  Name Route Sig Dispense Refill  .  AMLODIPINE-OLMESARTAN 10-40 MG PO TABS Oral Take 1 tablet by mouth daily.    . AMOXICILLIN-POT CLAVULANATE 875-125 MG PO TABS Oral Take 1 tablet by mouth 2 (two) times daily.    . FEBUXOSTAT 40 MG PO TABS Oral Take 40 mg by mouth daily.    Marland Kitchen HYDROCODONE-ACETAMINOPHEN 5-325 MG PO TABS Oral Take 1 tablet by mouth every 4 (four) hours as needed. For pain.      BP 147/86  Pulse 99  Temp 98.1 F (36.7 C) (Oral)  Resp 18  Ht 6\' 1"  (1.854 m)  Wt 368 lb (166.924 kg)  BMI 48.55 kg/m2  SpO2 96%  Physical Exam GEN: Well-developed, well-nourished male in no acute distress HEENT: Atraumatic, normocephalic. CV: regular rate and rhythm. No murmurs, rubs, or gallops PULM: No respiratory distress.  No crackles, wheezes, or rales. GU: deferred Neuro: No abnormalities of strength or sensation, A and O x 3 MSK: Swelling over the dorsum of the right foot and tenderness to palpation consistent with muscular strain or sprain. Skin: No rashes petechiae, purpura, or jaundice Psych: no abnormality of mood  ED Course  Procedures (including critical care time) DIAGNOSTIC STUDIES: Oxygen Saturation is 96% on room air, adequate by my interpretation.    COORDINATION OF CARE: 12:59pm- Patient informed of current plan for treatment and evaluation and agrees with plan at this time.   Labs Reviewed - No data to display  No results found.  1. Sprain of foot, right       MDM  Patient was seen one week ago after stepping on a nail. Patient was treated with Augmentin at that time. On my evaluation patient has absolutely no pain at the former puncture site as well as no erythema or induration. Patient Randall Mccall complains of pain on the dorsum of his foot  Rather than where the puncture occurred. He describes that when the event occurred he jumped up suddenly and fell down. Based on history appears the patient twisted his foot when this occurred. There is no need for repeat imaging as patient did not have bony  injury previously. Patient was advised to use rice therapy for this. He is also told to use anti-inflammatories and was given a prescription for tramadol if over-the-counter medicines were insufficient. Patient was comfortable with plan and was discharged in good condition.      I personally performed the services described in this documentation, which was scribed in my presence. The recorded information has been reviewed and considered.      Randall Numbers, MD 05/18/12 1106

## 2012-05-17 NOTE — ED Notes (Signed)
Patient seen here last Saturday for a nail in his right. Foot.  States that he took all of his antibiotics.  Her returns today with pain and swelling in his right foot.  States that ambulation is difficult due to the pain.  States that his Gout might be flaring.  Right foot noted to be swollen and red.

## 2012-06-09 DIAGNOSIS — Z79899 Other long term (current) drug therapy: Secondary | ICD-10-CM | POA: Diagnosis not present

## 2012-06-09 DIAGNOSIS — M1A9XX1 Chronic gout, unspecified, with tophus (tophi): Secondary | ICD-10-CM | POA: Diagnosis not present

## 2012-06-09 DIAGNOSIS — M2550 Pain in unspecified joint: Secondary | ICD-10-CM | POA: Diagnosis not present

## 2012-06-09 DIAGNOSIS — Z23 Encounter for immunization: Secondary | ICD-10-CM | POA: Diagnosis not present

## 2012-06-09 DIAGNOSIS — R748 Abnormal levels of other serum enzymes: Secondary | ICD-10-CM | POA: Diagnosis not present

## 2012-06-09 DIAGNOSIS — R7989 Other specified abnormal findings of blood chemistry: Secondary | ICD-10-CM | POA: Diagnosis not present

## 2012-09-16 DIAGNOSIS — L280 Lichen simplex chronicus: Secondary | ICD-10-CM | POA: Diagnosis not present

## 2012-09-16 DIAGNOSIS — L219 Seborrheic dermatitis, unspecified: Secondary | ICD-10-CM | POA: Diagnosis not present

## 2012-12-08 DIAGNOSIS — R748 Abnormal levels of other serum enzymes: Secondary | ICD-10-CM | POA: Diagnosis not present

## 2012-12-08 DIAGNOSIS — Z79899 Other long term (current) drug therapy: Secondary | ICD-10-CM | POA: Diagnosis not present

## 2012-12-08 DIAGNOSIS — M1A9XX1 Chronic gout, unspecified, with tophus (tophi): Secondary | ICD-10-CM | POA: Diagnosis not present

## 2012-12-08 DIAGNOSIS — M2550 Pain in unspecified joint: Secondary | ICD-10-CM | POA: Diagnosis not present

## 2012-12-30 ENCOUNTER — Ambulatory Visit (INDEPENDENT_AMBULATORY_CARE_PROVIDER_SITE_OTHER): Payer: Medicare Other | Admitting: Neurology

## 2012-12-30 ENCOUNTER — Ambulatory Visit (INDEPENDENT_AMBULATORY_CARE_PROVIDER_SITE_OTHER): Payer: Medicare Other

## 2012-12-30 DIAGNOSIS — M2550 Pain in unspecified joint: Secondary | ICD-10-CM

## 2012-12-30 DIAGNOSIS — Z0289 Encounter for other administrative examinations: Secondary | ICD-10-CM

## 2012-12-30 DIAGNOSIS — R748 Abnormal levels of other serum enzymes: Secondary | ICD-10-CM

## 2012-12-30 DIAGNOSIS — M79609 Pain in unspecified limb: Secondary | ICD-10-CM | POA: Diagnosis not present

## 2012-12-30 NOTE — Procedures (Signed)
History: 42 years old right-handed [place] American male, with history of elevated CPK, recent evaluation was 1131, elevated aldolase 14.3. He is on disability now due to learning disorder, he denies muscle weakness, he is able to do 100 pushup each time, without difficulty, walking 2 miles with his dog without difficulty, he denies muscle aching pain  On examination: Bilateral upper and lower extremity motor strength is normal. Deep tendon reflexes were present and symmetric  Nerve conduction study: Right peroneal sensory response was normal. Right peroneal to EDB, tibial motor responses were normal. Right median, ulnar sensory and motor responses were normal  Electromyography:  Selected needle examination was performed at bilateral lower extremity muscles, right upper extremity muscles, right cervical, lumbar spinal paraspinal muscles   Bilateral medial gastrocnemius, increased insertion activity, 2 plus positive waves, normal morphology motor unit potential, with normal recruitment pattern   Needle examination of right tibialis anterior vastus lateralis was normal.  Right biceps: increased insertion activity, 2 plus positive waves, normal morphology motor unit potential, with normal recruitment pattern.  Needle examination of right triceps, deltoid was normal.  Right lumbosacral paraspinal muscles, cervical paraspinal muscles: increased insertion activity spontaneous activity, small spiky motor unit potential, early recruitment patterns.  In conclusion:  This is a mild abnormal study.  There is no electrodiagnostic evidence of large fiber neuropathy, or lumbar radiculopathy. There is evidence of cell membrane irritability involving cervical, lumbar paraspinal muscles, similar involvement of bilateral tibialis anterior, right biceps.  In combination with elevated CPK,  This could indicate an inflammatory process, or other metabolic myopathy. Continue followup with CPK, may consider muscle  biopsy to clarify diagnoses.

## 2013-05-27 DIAGNOSIS — Z79899 Other long term (current) drug therapy: Secondary | ICD-10-CM | POA: Diagnosis not present

## 2013-05-27 DIAGNOSIS — M1A00X1 Idiopathic chronic gout, unspecified site, with tophus (tophi): Secondary | ICD-10-CM | POA: Diagnosis not present

## 2013-05-27 DIAGNOSIS — M2550 Pain in unspecified joint: Secondary | ICD-10-CM | POA: Diagnosis not present

## 2013-05-27 DIAGNOSIS — R748 Abnormal levels of other serum enzymes: Secondary | ICD-10-CM | POA: Diagnosis not present

## 2013-06-30 DIAGNOSIS — L910 Hypertrophic scar: Secondary | ICD-10-CM | POA: Diagnosis not present

## 2013-07-02 ENCOUNTER — Encounter: Payer: Self-pay | Admitting: Neurology

## 2013-07-02 ENCOUNTER — Ambulatory Visit (INDEPENDENT_AMBULATORY_CARE_PROVIDER_SITE_OTHER): Payer: Medicare Other | Admitting: Neurology

## 2013-07-02 VITALS — BP 139/79 | HR 85 | Ht 72.0 in | Wt 250.0 lb

## 2013-07-02 DIAGNOSIS — R5383 Other fatigue: Secondary | ICD-10-CM

## 2013-07-02 DIAGNOSIS — R5381 Other malaise: Secondary | ICD-10-CM

## 2013-07-02 DIAGNOSIS — R748 Abnormal levels of other serum enzymes: Secondary | ICD-10-CM

## 2013-07-02 DIAGNOSIS — R531 Weakness: Secondary | ICD-10-CM

## 2013-07-02 NOTE — Progress Notes (Signed)
[facility]  PATIENT: Randall Mccall DOB: 19-Mar-1971  HISTORICAL [name] is a 42 years old right-handed African American male, he was referred by his primary care physician Dr. Nickola Major for evaluation of elevated CPK, aldolase, with abnormal EMG,  I had first met him in May 2014, at that time, he has elevated CPK 1131, activated aldolase 14.3, he denies significant weakness, but on the needle examination, there is evidence of spontaneous activity, inflammatory changes at the right biceps, bilateral tibialis anterior, and right cervical, lumbosacral paraspinal muscles.  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 on disability around 2005, because of his learning disability,  He now lives alone, does not work, he continues to exercise some, lifting 50 pounds weight without difficulty, doing pushup, walking his dog couple miles a day without any difficulty, he denies significant muscle achy pain, no chest pain, no leg weakness, no gait difficulty, no rash broke out  He denies smoking, drinks occasionally, he denied tea-colored urine  REVIEW OF SYSTEMS: Full 14 system review of systems performed and notable only for  ALLERGIES: No Known Allergies  HOME MEDICATIONS: Outpatient Prescriptions Prior to Visit  Medication Sig Dispense Refill  . amLODipine-olmesartan (AZOR) 10-40 MG per tablet Take 1 tablet by mouth daily.      Marland Kitchen amoxicillin-clavulanate (AUGMENTIN) 875-125 MG per tablet Take 1 tablet by mouth 2 (two) times daily.      . febuxostat (ULORIC) 40 MG tablet Take 40 mg by mouth daily.      Marland Kitchen HYDROcodone-acetaminophen (NORCO/VICODIN) 5-325 MG per tablet Take 1 tablet by mouth every 4 (four) hours as needed. For pain.      Marland Kitchen ibuprofen (ADVIL,MOTRIN) 800 MG tablet Take 1 tablet (800 mg total) by mouth  every 8 (eight) hours as needed for pain.  30 tablet  0  . traMADol (ULTRAM) 50 MG tablet Take 1 tablet (50 mg total) by mouth every 6 (six) hours as needed for pain.  30 tablet  0   No facility-administered medications prior to visit.    PAST MEDICAL HISTORY: Past Medical History  Diagnosis Date  . Hypertension   . Gout   . Muscle weakness     PAST SURGICAL HISTORY: Past Surgical History  Procedure Laterality Date  . Knee surgery      FAMILY HISTORY: No family history on file.  SOCIAL HISTORY:  History   Social History  . Marital Status: Single    Spouse Name: N/A    Number of Children: 0  . Years of Education: 12   Occupational History  .      Disabled   Social History Main Topics  . Smoking status: Never Smoker   . Smokeless tobacco: Never Used  . Alcohol Use: 0.6 oz/week    1 Cans of beer per week  . Drug Use: No  . Sexual Activity: Not on file   Other Topics Concern  . Not on file   Social History Narrative   Patient lives alone and he is single.   Right handed.   Caffeine-    Education- 12 th grade   Disabled.     PHYSICAL EXAM   Filed Vitals:   07/02/13 1143  BP: 139/79  Pulse: 85  Height: 6' (1.829 m)  Weight: 250 lb (113.399 kg)  Not recorded    Body mass index is 33.9 kg/(m^2).   Generalized: In no acute distress  Neck: Supple, no carotid bruits   Cardiac: Regular rate rhythm  Pulmonary: Clear to auscultation bilaterally  Musculoskeletal: No deformity  Neurological examination  Mentation: Alert oriented to time, place, history taking, and causual conversation  Cranial nerve II-XII: Pupils were equal round reactive to light extraocular movements were full, visual field were full on confrontational test. facial sensation and strength were normal. hearing was intact to finger rubbing bilaterally. Uvula tongue midline.  head turning and shoulder shrug and were normal and symmetric.Tongue protrusion into cheek strength was  normal.  Motor: normal tone, bulk and strength.  Sensory: Intact to fine touch, pinprick, preserved vibratory sensation, and proprioception at toes.  Coordination: Normal finger to nose, heel-to-shin bilaterally there was no truncal ataxia  Gait: Rising up from seated position without assistance, normal stance, without trunk ataxia, moderate stride, good arm swing, smooth turning, able to perform tiptoe, and heel walking without difficulty.   Romberg signs: Negative  Deep tendon reflexes: Brachioradialis 2/2, biceps 2/2, triceps 2/2, patellar 2/2, Achilles 2/2, plantar responses were flexor bilaterally.   DIAGNOSTIC DATA (LABS, IMAGING, TESTING) - I reviewed patient records, labs, notes, testing and imaging myself where available.  Lab Results  Component Value Date   WBC 8.9 06/12/2010   HGB 14.9 06/12/2010   HCT 44.3 06/12/2010   MCV 86.7 06/12/2010   PLT 525* 06/12/2010      Component Value Date/Time   NA 137 06/12/2010 1053   K 4.4 06/12/2010 1053   CL 104 06/12/2010 1053   CO2 24 06/12/2010 1053   GLUCOSE 94 06/12/2010 1053   BUN 14 06/12/2010 1053   CREATININE 1.32 06/12/2010 1053   CALCIUM 9.8 06/12/2010 1053   GFRNONAA >60 06/12/2010 1053   GFRAA  Value: >60        The eGFR has been calculated using the MDRD equation. This calculation has not been validated in all clinical situations. eGFR's persistently <60 mL/min signify possible Chronic Kidney Disease. 06/12/2010 1053   ASSESSMENT AND PLAN   42 years old right-handed [place] American male, with elevated CPK, aldolase level, abnormal EMG, which has demonstrated spontaneous activity at cervical, lumbar paraspinal, bilateral tibialis anterior, right biceps, indicating of inflammatory myopathy vs. metabolic myopathy, he denies significant muscle pain, no weakness  1 Will proceed with repeat laboratory evaluation, including CPK, aldolase, 2 if there is continued evidence of significant elevated CPK, may consider  muscle biopsy      Levert Feinstein, M.D. Ph.D.  [facility] [address], [address] [place], [place] [address] [phone]

## 2013-12-09 DIAGNOSIS — R748 Abnormal levels of other serum enzymes: Secondary | ICD-10-CM | POA: Diagnosis not present

## 2013-12-09 DIAGNOSIS — M2550 Pain in unspecified joint: Secondary | ICD-10-CM | POA: Diagnosis not present

## 2013-12-09 DIAGNOSIS — M1A00X1 Idiopathic chronic gout, unspecified site, with tophus (tophi): Secondary | ICD-10-CM | POA: Diagnosis not present

## 2013-12-09 DIAGNOSIS — Z79899 Other long term (current) drug therapy: Secondary | ICD-10-CM | POA: Diagnosis not present

## 2013-12-30 ENCOUNTER — Ambulatory Visit: Payer: Medicare Other | Admitting: Neurology

## 2014-01-11 ENCOUNTER — Encounter (INDEPENDENT_AMBULATORY_CARE_PROVIDER_SITE_OTHER): Payer: Self-pay

## 2014-01-11 ENCOUNTER — Encounter: Payer: Self-pay | Admitting: Neurology

## 2014-01-11 ENCOUNTER — Ambulatory Visit (INDEPENDENT_AMBULATORY_CARE_PROVIDER_SITE_OTHER): Payer: Medicare Other | Admitting: Neurology

## 2014-01-11 VITALS — BP 158/95 | HR 98 | Ht 71.5 in | Wt 253.0 lb

## 2014-01-11 DIAGNOSIS — R5381 Other malaise: Secondary | ICD-10-CM | POA: Diagnosis not present

## 2014-01-11 DIAGNOSIS — R5383 Other fatigue: Secondary | ICD-10-CM

## 2014-01-11 DIAGNOSIS — R748 Abnormal levels of other serum enzymes: Secondary | ICD-10-CM | POA: Diagnosis not present

## 2014-01-11 DIAGNOSIS — R531 Weakness: Secondary | ICD-10-CM

## 2014-01-11 NOTE — Progress Notes (Signed)
[facility]  PATIENT: Randall Mccall DOB: 1971/03/16  HISTORICAL ( initial visit is Nov 2014) Randall Mccall is a 43 years old right-handed African American male, he was referred by his primary care physician Dr. Trudie Reed for evaluation of elevated CPK, aldolase, with abnormal EMG,  I had first met him in May 2014, at that time, he has elevated CPK 1131, elevated aldolase 14.3, he denies significant weakness, but on the needle examination, there is evidence of spontaneous activity, inflammatory changes at the right biceps, bilateral tibialis anterior, and right cervical, lumbosacral paraspinal muscles.  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  REVIEW OF SYSTEMS: Full 14 system review of systems performed and notable only for  ALLERGIES: No Known Allergies  HOME MEDICATIONS: Outpatient Prescriptions Prior to Visit  Medication Sig Dispense Refill  . amLODipine-olmesartan (AZOR) 10-40 MG per tablet Take 1 tablet by mouth daily.      Marland Kitchen amoxicillin-clavulanate (AUGMENTIN) 875-125 MG per tablet Take 1 tablet by mouth 2 (two) times daily.      Marland Kitchen COLCRYS 0.6 MG tablet 0.6 mg.      . febuxostat (ULORIC) 40 MG tablet Take 40 mg  by mouth daily.      . fluocinonide ointment (LIDEX) 0.05 %       . HYDROcodone-acetaminophen (NORCO/VICODIN) 5-325 MG per tablet Take 1 tablet by mouth every 4 (four) hours as needed. For pain.      Marland Kitchen ibuprofen (ADVIL,MOTRIN) 800 MG tablet Take 1 tablet (800 mg total) by mouth every 8 (eight) hours as needed for pain.  30 tablet  0  . traMADol (ULTRAM) 50 MG tablet Take 1 tablet (50 mg total) by mouth every 6 (six) hours as needed for pain.  30 tablet  0   No facility-administered medications prior to visit.    PAST MEDICAL HISTORY: Past Medical History  Diagnosis Date  . Hypertension   . Gout   . Muscle weakness     PAST SURGICAL HISTORY: Past Surgical History  Procedure Laterality Date  . Knee surgery      FAMILY HISTORY: History reviewed. No pertinent family history.  SOCIAL HISTORY:  History   Social History  . Marital Status: Single    Spouse Name: N/A    Number of Children: 0  . Years of Education: 12   Occupational History  .      Disabled   Social History Main Topics  . Smoking status: Never Smoker   . Smokeless tobacco: Never Used  . Alcohol Use: 0.6 oz/week    1 Cans of beer per week  . Drug Use: No  . Sexual Activity: Not on file   Other Topics Concern  . Not on file  Social History Narrative   Patient lives alone and he is single.   Right handed.   Caffeine-    Education- 12 th grade   Disabled.     PHYSICAL EXAM   Filed Vitals:   01/11/14 1316  BP: 158/95  Pulse: 98  Height: 5' 11.5" (1.816 m)  Weight: 253 lb (114.76 kg)    Not recorded    Body mass index is 34.8 kg/(m^2).   Generalized: In no acute distress  Neck: Supple, no carotid bruits   Cardiac: Regular rate rhythm  Pulmonary: Clear to auscultation bilaterally  Musculoskeletal: No deformity  Neurological examination  Mentation: Alert oriented to time, place, history taking, and causual conversation  Cranial nerve II-XII: Pupils were equal round reactive to  light extraocular movements were full, visual field were full on confrontational test. facial sensation and strength were normal. hearing was intact to finger rubbing bilaterally. Uvula tongue midline.  head turning and shoulder shrug and were normal and symmetric.Tongue protrusion into cheek strength was normal.  Motor: normal tone, bulk and strength.  Sensory: Intact to fine touch, pinprick, preserved vibratory sensation, and proprioception at toes.  Coordination: Normal finger to nose, heel-to-shin bilaterally there was no truncal ataxia  Gait: Rising up from seated position without assistance, normal stance, without trunk ataxia, moderate stride, good arm swing, smooth turning, able to perform tiptoe, and heel walking without difficulty.   Romberg signs: Negative  Deep tendon reflexes: Brachioradialis 2/2, biceps 2/2, triceps 2/2, patellar 2/2, Achilles 2/2, plantar responses were flexor bilaterally.   DIAGNOSTIC DATA (LABS, IMAGING, TESTING) - I reviewed patient records, labs, notes, testing and imaging myself where available.  Lab Results  Component Value Date   WBC 8.9 06/12/2010   HGB 14.9 06/12/2010   HCT 44.3 06/12/2010   MCV 86.7 06/12/2010   PLT 525* 06/12/2010      Component Value Date/Time   NA 137 06/12/2010 1053   K 4.4 06/12/2010 1053   CL 104 06/12/2010 1053   CO2 24 06/12/2010 1053   GLUCOSE 94 06/12/2010 1053   BUN 14 06/12/2010 1053   CREATININE 1.32 06/12/2010 1053   CALCIUM 9.8 06/12/2010 1053   GFRNONAA >60 06/12/2010 1053   GFRAA  Value: >60        The eGFR has been calculated using the MDRD equation. This calculation has not been validated in all clinical situations. eGFR's persistently <60 mL/min signify possible Chronic Kidney Disease. 06/12/2010 1053   ASSESSMENT AND PLAN   43 years old right-handed [place] American male, with elevated CPK, aldolase level, abnormal EMG, which has demonstrated spontaneous activity at cervical, lumbar paraspinal,  bilateral tibialis anterior, right biceps, indicating of inflammatory myopathy vs. metabolic myopathy, he denies significant muscle pain, no weakness  1 Will proceed with repeat laboratory evaluation, including CPK, aldolase, 2. ECHO 3. RTC in 3 months. 4. May consider repeat EMG, even muscle biopsy     Marcial Pacas, M.D. Ph.D.  [facility] [address] [place], [place] [address] [phone]

## 2014-01-12 LAB — COMPREHENSIVE METABOLIC PANEL
ALT: 62 IU/L — ABNORMAL HIGH (ref 0–44)
AST: 55 IU/L — ABNORMAL HIGH (ref 0–40)
Albumin/Globulin Ratio: 1.7 (ref 1.1–2.5)
Albumin: 5.3 g/dL (ref 3.5–5.5)
Alkaline Phosphatase: 100 IU/L (ref 39–117)
BUN/Creatinine Ratio: 8 — ABNORMAL LOW (ref 9–20)
BUN: 11 mg/dL (ref 6–24)
CO2: 25 mmol/L (ref 18–29)
Calcium: 10.2 mg/dL (ref 8.7–10.2)
Chloride: 95 mmol/L — ABNORMAL LOW (ref 97–108)
Creatinine, Ser: 1.42 mg/dL — ABNORMAL HIGH (ref 0.76–1.27)
GFR calc Af Amer: 70 mL/min/{1.73_m2} (ref >59)
GFR calc non Af Amer: 60 mL/min/{1.73_m2} (ref >59)
Globulin, Total: 3.2 g/dL (ref 1.5–4.5)
Glucose: 83 mg/dL (ref 65–99)
Potassium: 4.7 mmol/L (ref 3.5–5.2)
Sodium: 137 mmol/L (ref 134–144)
Total Bilirubin: 0.5 mg/dL (ref 0.0–1.2)
Total Protein: 8.5 g/dL (ref 6.0–8.5)

## 2014-01-12 LAB — SEDIMENTATION RATE: Sed Rate: 6 mm/hr (ref 0–15)

## 2014-01-12 LAB — HIV ANTIBODY (ROUTINE TESTING W REFLEX)
HIV 1/O/2 Abs-Index Value: <1 (ref <1.00)
HIV-1/HIV-2 Ab: NONREACTIVE

## 2014-01-12 LAB — CBC WITH DIFFERENTIAL
Basophils Absolute: 0 10*3/uL (ref 0.0–0.2)
Basos: 1 %
Eos: 7 %
Eosinophils Absolute: 0.4 10*3/uL (ref 0.0–0.4)
HCT: 50.1 % (ref 37.5–51.0)
Hemoglobin: 17.3 g/dL (ref 12.6–17.7)
Immature Grans (Abs): 0 10*3/uL (ref 0.0–0.1)
Immature Granulocytes: 0 %
Lymphocytes Absolute: 1.3 10*3/uL (ref 0.7–3.1)
Lymphs: 22 %
MCH: 31.6 pg (ref 26.6–33.0)
MCHC: 34.5 g/dL (ref 31.5–35.7)
MCV: 92 fL (ref 79–97)
Monocytes Absolute: 0.4 10*3/uL (ref 0.1–0.9)
Monocytes: 7 %
Neutrophils Absolute: 3.7 10*3/uL (ref 1.4–7.0)
Neutrophils Relative %: 63 %
Platelets: 316 10*3/uL (ref 150–379)
RBC: 5.47 x10E6/uL (ref 4.14–5.80)
RDW: 13.9 % (ref 12.3–15.4)
WBC: 5.9 10*3/uL (ref 3.4–10.8)

## 2014-01-12 LAB — THYROID PANEL WITH TSH
Free Thyroxine Index: 1.4 (ref 1.2–4.9)
T3 Uptake Ratio: 26 % (ref 24–39)
T4, Total: 5.5 ug/dL (ref 4.5–12.0)
TSH: 2.3 u[IU]/mL (ref 0.450–4.500)

## 2014-01-12 LAB — C-REACTIVE PROTEIN: CRP: 5.5 mg/L — ABNORMAL HIGH (ref 0.0–4.9)

## 2014-01-12 LAB — CK: Total CK: 1004 U/L (ref 24–204)

## 2014-01-12 LAB — VITAMIN B12: Vitamin B-12: 187 pg/mL — ABNORMAL LOW (ref 211–946)

## 2014-01-12 LAB — RPR: RPR: NONREACTIVE

## 2014-01-12 LAB — FOLATE: Folate: 10.9 ng/mL (ref >3.0)

## 2014-01-26 ENCOUNTER — Other Ambulatory Visit (HOSPITAL_COMMUNITY): Payer: Medicare Other

## 2014-04-13 ENCOUNTER — Ambulatory Visit: Payer: Medicare Other | Admitting: Neurology

## 2014-05-21 ENCOUNTER — Ambulatory Visit: Payer: Medicare Other | Admitting: Neurology

## 2014-06-10 DIAGNOSIS — Z23 Encounter for immunization: Secondary | ICD-10-CM | POA: Diagnosis not present

## 2014-06-10 DIAGNOSIS — M2550 Pain in unspecified joint: Secondary | ICD-10-CM | POA: Diagnosis not present

## 2014-06-10 DIAGNOSIS — R5383 Other fatigue: Secondary | ICD-10-CM | POA: Diagnosis not present

## 2014-06-10 DIAGNOSIS — R7989 Other specified abnormal findings of blood chemistry: Secondary | ICD-10-CM | POA: Diagnosis not present

## 2014-06-10 DIAGNOSIS — R748 Abnormal levels of other serum enzymes: Secondary | ICD-10-CM | POA: Diagnosis not present

## 2014-06-10 DIAGNOSIS — M1A9XX1 Chronic gout, unspecified, with tophus (tophi): Secondary | ICD-10-CM | POA: Diagnosis not present

## 2015-08-10 ENCOUNTER — Encounter: Payer: Self-pay | Admitting: Neurology

## 2015-08-10 ENCOUNTER — Ambulatory Visit (INDEPENDENT_AMBULATORY_CARE_PROVIDER_SITE_OTHER): Payer: Medicare Other | Admitting: Neurology

## 2015-08-10 VITALS — BP 171/104 | HR 90 | Ht 71.5 in | Wt 258.0 lb

## 2015-08-10 DIAGNOSIS — E538 Deficiency of other specified B group vitamins: Secondary | ICD-10-CM | POA: Diagnosis not present

## 2015-08-10 DIAGNOSIS — R531 Weakness: Secondary | ICD-10-CM | POA: Diagnosis not present

## 2015-08-10 DIAGNOSIS — R748 Abnormal levels of other serum enzymes: Secondary | ICD-10-CM | POA: Diagnosis not present

## 2015-08-10 DIAGNOSIS — E559 Vitamin D deficiency, unspecified: Secondary | ICD-10-CM | POA: Diagnosis not present

## 2015-08-10 NOTE — Progress Notes (Signed)
[facility]  PATIENT: Randall Mccall DOB: [birth year]  HISTORICAL ( initial visit is Nov 2014) Mr. Weathington is a 44 years old right-handed African American male, he was referred by his primary care physician Dr. Trudie Reed for evaluation of elevated CPK, aldolase, with abnormal EMG in May 2015.  I had first met him in May 2014, at that time, he has elevated CPK 1131, elevated aldolase 14.3, he denies significant weakness, but on the needle examination, there is evidence of spontaneous activity, inflammatory changes at the right biceps, bilateral tibialis anterior, and right cervical, lumbosacral paraspinal muscles.  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  UPDATE Aug 10 2015: He has lost follow-up since last visit in May 2015, he denies significant weakness, no cardiac complaints,  I reviewed lab in 2015, elevated CPK 1005, low b12 187, CMP showed creat 1.4, normal CBC.  He he walked to clinic today, he denies gait difficulty, complains of left hand numbness, weakness, stiffness,  REVIEW OF SYSTEMS: Full 14 system review of systems performed and notable only for as above  ALLERGIES: No Known Allergies  HOME  MEDICATIONS: Outpatient Prescriptions Prior to Visit  Medication Sig Dispense Refill  . amLODipine-olmesartan (AZOR) 10-40 MG per tablet Take 1 tablet by mouth daily.    Marland Kitchen amoxicillin-clavulanate (AUGMENTIN) 875-125 MG per tablet Take 1 tablet by mouth 2 (two) times daily.    Marland Kitchen COLCRYS 0.6 MG tablet 0.6 mg.    . febuxostat (ULORIC) 40 MG tablet Take 40 mg by mouth daily.    . fluocinonide ointment (LIDEX) 0.05 %     . HYDROcodone-acetaminophen (NORCO/VICODIN) 5-325 MG per tablet Take 1 tablet by mouth every 4 (four) hours as needed. For pain.    Marland Kitchen ibuprofen (ADVIL,MOTRIN) 800 MG tablet Take 1 tablet (800 mg total) by mouth every 8 (eight) hours as needed for pain. 30 tablet 0  . traMADol (ULTRAM) 50 MG tablet Take 1 tablet (50 mg total) by mouth every 6 (six) hours as needed for pain. 30 tablet 0   No facility-administered medications prior to visit.    PAST MEDICAL HISTORY: Past Medical History  Diagnosis Date  . Hypertension   . Gout   . Muscle weakness     PAST SURGICAL HISTORY: Past Surgical History  Procedure Laterality Date  . Knee surgery      FAMILY HISTORY: No family history on file.  SOCIAL HISTORY:  Social History   Social History  . Marital Status: Single    Spouse Name: N/A  . Number of Children: 0  . Years of Education: 12   Occupational History  .      Disabled   Social History Main Topics  . Smoking status: Never Smoker   .  Smokeless tobacco: Never Used  . Alcohol Use: 0.6 oz/week    1 Cans of beer per week  . Drug Use: No  . Sexual Activity: Not on file   Other Topics Concern  . Not on file   Social History Narrative   Patient lives alone and he is single.   Right handed.   Caffeine-    Education- 12 th grade   Disabled.     PHYSICAL EXAM   Filed Vitals:   08/10/15 1218  BP: 171/104  Pulse: 90  Height: 5' 11.5" (1.816 m)  Weight: 258 lb (117.028 kg)    Not recorded      Body mass index is 35.49 kg/(m^2).   Generalized:  In no acute distress  Neck: Supple, no carotid bruits   Cardiac: Regular rate rhythm  Pulmonary: Clear to auscultation bilaterally  Musculoskeletal: No deformity  Neurological examination  Mentation: Alert oriented to time, place, history taking, and causual conversation  Cranial nerve II-XII: Pupils were equal round reactive to light extraocular movements were full, visual field were full on confrontational test. facial sensation and strength were normal. hearing was intact to finger rubbing bilaterally. Uvula tongue midline.  head turning and shoulder shrug and were normal and symmetric.Tongue protrusion into cheek strength was normal.  Motor: normal tone, bulk and strength.  Sensory: Intact to fine touch, pinprick, preserved vibratory sensation, and proprioception at toes.  Coordination: Normal finger to nose, heel-to-shin bilaterally there was no truncal ataxia  Gait: Rising up from seated position without assistance, normal stance, without trunk ataxia, moderate stride, good arm swing, smooth turning, able to perform tiptoe, and heel walking without difficulty.   Romberg signs: Negative  Deep tendon reflexes: Brachioradialis 2/2, biceps 2/2, triceps 2/2, patellar 2/2, Achilles 2/2, plantar responses were flexor bilaterally.   DIAGNOSTIC DATA (LABS, IMAGING, TESTING) - I reviewed patient records, labs, notes, testing and imaging myself where available.  Lab Results  Component Value Date   WBC 5.9 01/11/2014   HGB 17.3 01/11/2014   HCT 50.1 01/11/2014   MCV 92 01/11/2014   PLT 316 01/11/2014      Component Value Date/Time   NA 137 01/11/2014 1430   NA 137 06/12/2010 1053   K 4.7 01/11/2014 1430   CL 95* 01/11/2014 1430   CO2 25 01/11/2014 1430   GLUCOSE 83 01/11/2014 1430   GLUCOSE 94 06/12/2010 1053   BUN 11 01/11/2014 1430   BUN 14 06/12/2010 1053   CREATININE 1.42* 01/11/2014 1430   CALCIUM 10.2 01/11/2014 1430   PROT 8.5 01/11/2014 1430   ALBUMIN 5.3  01/11/2014 1430   AST 55* 01/11/2014 1430   ALT 62* 01/11/2014 1430   ALKPHOS 100 01/11/2014 1430   BILITOT 0.5 01/11/2014 1430   GFRNONAA 60 01/11/2014 1430   GFRAA 70 01/11/2014 1430   ASSESSMENT AND PLAN   44 years old right-handed [place] American male, with elevated CPK, aldolase level, abnormal EMG, which has demonstrated spontaneous activity at cervical, lumbar paraspinal, bilateral tibialis anterior, right biceps, indicating of inflammatory myopathy vs. metabolic myopathy, he denies significant muscle pain, no weakness  Elevated CPK  In the setting of strenuous exercise, abnormal kidney function, with creatinine of 1.4  Repeat laboratory evaluations,   EMG nerve conduction study  Vitamin B12 deficiency  Repeat  B12 level, B12 was 187 in May 2015  Potential B12 supplement     Marcial Pacas, M.D. Ph.D.  [facility] [address] [place], [place] [address] [phone]

## 2015-08-11 ENCOUNTER — Other Ambulatory Visit: Payer: Self-pay | Admitting: *Deleted

## 2015-08-11 ENCOUNTER — Encounter: Payer: Self-pay | Admitting: *Deleted

## 2015-08-11 ENCOUNTER — Telehealth: Payer: Self-pay | Admitting: Neurology

## 2015-08-11 DIAGNOSIS — E538 Deficiency of other specified B group vitamins: Secondary | ICD-10-CM

## 2015-08-11 MED ORDER — CYANOCOBALAMIN 1000 MCG/ML IJ SOLN
1000.0000 ug | INTRAMUSCULAR | Status: DC
  Administered 2015-08-23 – 2015-08-24 (×2): 1000 ug via INTRAMUSCULAR

## 2015-08-11 MED ORDER — CYANOCOBALAMIN 1000 MCG/ML IJ SOLN
1000.0000 ug | Freq: Every day | INTRAMUSCULAR | Status: AC
  Administered 2015-08-15 – 2015-08-18 (×4): 1000 ug via INTRAMUSCULAR

## 2015-08-11 MED ORDER — CYANOCOBALAMIN 1000 MCG/ML IJ SOLN: 1000.0000 ug | INTRAMUSCULAR | Status: AC

## 2015-08-11 NOTE — Telephone Encounter (Signed)
Spoke to patient - states he spoke to the pharmacist and was given a 5000 unit tablet and he is taking one daily.

## 2015-08-11 NOTE — Telephone Encounter (Signed)
Spoke to patient - he is aware of results.  He will add oral D3 as directed.  He would like to start his B12 injections next Monday.  Orders have been placed in Epic.

## 2015-08-11 NOTE — Telephone Encounter (Signed)
Mailbox full - no answer.  He should be taking OTC oral Vitamin D3 as directed below.

## 2015-08-11 NOTE — Telephone Encounter (Signed)
Patient called back after just speaking with Sharyn Lull, requests a call back.

## 2015-08-11 NOTE — Telephone Encounter (Signed)
Please call patient, lab showed low vit B12. 169, he needs to come back to office for IM supplement, 1000 g once daily for one week, once week for one month, then monthly.  He also need vitamin D 3 supplement, 1000 units over-the-counter, 4 tablets each day

## 2015-08-11 NOTE — Telephone Encounter (Signed)
Patient is calling. He needs to know the dosage for Vitamin D3. I advised that he needs to take as directed as in the notes below but he wants a returned call.

## 2015-08-11 NOTE — Telephone Encounter (Signed)
Attempted to call patient back - no answer - mailbox full.

## 2015-08-13 LAB — THYROID PANEL WITH TSH
Free Thyroxine Index: 1.3 (ref 1.2–4.9)
T3 Uptake Ratio: 25 % (ref 24–39)
T4, Total: 5.3 ug/dL (ref 4.5–12.0)
TSH: 1.89 u[IU]/mL (ref 0.450–4.500)

## 2015-08-13 LAB — FOLATE: Folate: 13 ng/mL (ref >3.0)

## 2015-08-13 LAB — COMPREHENSIVE METABOLIC PANEL
ALT: 36 IU/L (ref 0–44)
AST: 45 IU/L — ABNORMAL HIGH (ref 0–40)
Albumin/Globulin Ratio: 1.5 (ref 1.1–2.5)
Albumin: 4.8 g/dL (ref 3.5–5.5)
Alkaline Phosphatase: 79 IU/L (ref 39–117)
BUN/Creatinine Ratio: 12 (ref 9–20)
BUN: 14 mg/dL (ref 6–24)
Bilirubin Total: 0.4 mg/dL (ref 0.0–1.2)
CO2: 24 mmol/L (ref 18–29)
Calcium: 10.2 mg/dL (ref 8.7–10.2)
Chloride: 96 mmol/L (ref 96–106)
Creatinine, Ser: 1.18 mg/dL (ref 0.76–1.27)
GFR calc Af Amer: 87 mL/min/{1.73_m2} (ref >59)
GFR calc non Af Amer: 75 mL/min/{1.73_m2} (ref >59)
Globulin, Total: 3.3 g/dL (ref 1.5–4.5)
Glucose: 97 mg/dL (ref 65–99)
Potassium: 5.1 mmol/L (ref 3.5–5.2)
Sodium: 139 mmol/L (ref 134–144)
Total Protein: 8.1 g/dL (ref 6.0–8.5)

## 2015-08-13 LAB — ALDOLASE: Aldolase: 9 U/L (ref 3.3–10.3)

## 2015-08-13 LAB — C-REACTIVE PROTEIN: CRP: 2.9 mg/L (ref 0.0–4.9)

## 2015-08-13 LAB — VITAMIN D 25 HYDROXY (VIT D DEFICIENCY, FRACTURES): Vit D, 25-Hydroxy: 5.4 ng/mL — ABNORMAL LOW (ref 30.0–100.0)

## 2015-08-13 LAB — METHYLMALONIC ACID, SERUM: Methylmalonic Acid: 427 nmol/L — ABNORMAL HIGH (ref 0–378)

## 2015-08-13 LAB — VITAMIN B12: Vitamin B-12: 169 pg/mL — ABNORMAL LOW (ref 211–946)

## 2015-08-15 ENCOUNTER — Ambulatory Visit (INDEPENDENT_AMBULATORY_CARE_PROVIDER_SITE_OTHER): Payer: Medicare Other

## 2015-08-15 VITALS — BP 191/123 | HR 85 | Resp 20

## 2015-08-15 DIAGNOSIS — E538 Deficiency of other specified B group vitamins: Secondary | ICD-10-CM | POA: Diagnosis not present

## 2015-08-16 ENCOUNTER — Ambulatory Visit (INDEPENDENT_AMBULATORY_CARE_PROVIDER_SITE_OTHER): Payer: Medicare Other | Admitting: *Deleted

## 2015-08-16 DIAGNOSIS — E538 Deficiency of other specified B group vitamins: Secondary | ICD-10-CM | POA: Diagnosis not present

## 2015-08-17 ENCOUNTER — Ambulatory Visit (INDEPENDENT_AMBULATORY_CARE_PROVIDER_SITE_OTHER): Payer: Medicare Other

## 2015-08-17 DIAGNOSIS — E538 Deficiency of other specified B group vitamins: Secondary | ICD-10-CM

## 2015-08-18 ENCOUNTER — Ambulatory Visit (INDEPENDENT_AMBULATORY_CARE_PROVIDER_SITE_OTHER): Payer: Medicare Other | Admitting: *Deleted

## 2015-08-18 DIAGNOSIS — E538 Deficiency of other specified B group vitamins: Secondary | ICD-10-CM

## 2015-08-18 NOTE — Progress Notes (Signed)
B12 1011mcg/ml IM given in right deltoid. Cleaned with alcohol wipe prior to injection. Applied band-aid. Pt stable.

## 2015-08-23 ENCOUNTER — Ambulatory Visit (INDEPENDENT_AMBULATORY_CARE_PROVIDER_SITE_OTHER): Payer: Medicare Other

## 2015-08-23 DIAGNOSIS — E538 Deficiency of other specified B group vitamins: Secondary | ICD-10-CM | POA: Diagnosis not present

## 2015-08-24 ENCOUNTER — Encounter (INDEPENDENT_AMBULATORY_CARE_PROVIDER_SITE_OTHER): Payer: Medicare Other | Admitting: *Deleted

## 2015-08-24 ENCOUNTER — Ambulatory Visit (INDEPENDENT_AMBULATORY_CARE_PROVIDER_SITE_OTHER): Payer: Medicare Other | Admitting: *Deleted

## 2015-08-24 DIAGNOSIS — E538 Deficiency of other specified B group vitamins: Secondary | ICD-10-CM

## 2015-08-24 DIAGNOSIS — Z0289 Encounter for other administrative examinations: Secondary | ICD-10-CM

## 2015-08-24 MED ORDER — CYANOCOBALAMIN 1000 MCG/ML IJ SOLN
1000.0000 ug | Freq: Once | INTRAMUSCULAR | Status: AC
  Administered 2015-08-24: 1000 ug via INTRAMUSCULAR

## 2015-08-24 NOTE — Progress Notes (Signed)
Pt here for B 12 injection.  Under aseptic technique cyanocobalamin 1000mcg/1ml IM given R deltoid.  Tolerated well.  Bandaid applied.  

## 2015-08-24 NOTE — Progress Notes (Signed)
Pt here for B 12 injection.  Under aseptic technique cyanocobalamin 1043mcg/1ml IM given  R deltoid .  Tolerated well.  Bandaid applied.   This was his 6th injection.  He will be in tomorrow, and then will be weekly.  He made appt for next Tuesday, this will need to be changed to Friday or other day better for him. This encounter was created in error - please disregard. This encounter was created in error - please disregard.

## 2015-08-25 ENCOUNTER — Ambulatory Visit: Payer: Medicare Other

## 2015-08-25 ENCOUNTER — Ambulatory Visit (INDEPENDENT_AMBULATORY_CARE_PROVIDER_SITE_OTHER): Payer: Medicare Other | Admitting: *Deleted

## 2015-08-25 DIAGNOSIS — E538 Deficiency of other specified B group vitamins: Secondary | ICD-10-CM | POA: Diagnosis not present

## 2015-08-25 MED ORDER — CYANOCOBALAMIN 1000 MCG/ML IJ SOLN
1000.0000 ug | Freq: Once | INTRAMUSCULAR | Status: AC
  Administered 2015-08-25: 1000 ug via INTRAMUSCULAR

## 2015-08-25 NOTE — Progress Notes (Signed)
Pt here for B 12 injection.  Under aseptic technique cyanocobalamin 1000mcg/1ml IM given L deltoid.  Tolerated well.  Bandaid applied.  

## 2015-08-25 NOTE — Patient Instructions (Signed)
See next tues for first weekly injection.

## 2015-08-30 ENCOUNTER — Ambulatory Visit (INDEPENDENT_AMBULATORY_CARE_PROVIDER_SITE_OTHER): Payer: Medicare Other | Admitting: *Deleted

## 2015-08-30 DIAGNOSIS — E539 Vitamin B deficiency, unspecified: Secondary | ICD-10-CM

## 2015-08-30 MED ORDER — CYANOCOBALAMIN 1000 MCG/ML IJ SOLN
1000.0000 ug | INTRAMUSCULAR | Status: AC
  Administered 2015-08-30 – 2015-09-20 (×4): 1000 ug via INTRAMUSCULAR

## 2015-09-06 ENCOUNTER — Ambulatory Visit (INDEPENDENT_AMBULATORY_CARE_PROVIDER_SITE_OTHER): Payer: Medicare Other | Admitting: *Deleted

## 2015-09-06 ENCOUNTER — Encounter (INDEPENDENT_AMBULATORY_CARE_PROVIDER_SITE_OTHER): Payer: Self-pay

## 2015-09-06 DIAGNOSIS — E538 Deficiency of other specified B group vitamins: Secondary | ICD-10-CM

## 2015-09-06 DIAGNOSIS — E539 Vitamin B deficiency, unspecified: Secondary | ICD-10-CM

## 2015-09-06 NOTE — Patient Instructions (Signed)
See next week

## 2015-09-06 NOTE — Progress Notes (Signed)
Pt here for B 12 injection.  Under aseptic technique cyanocobalamin 1000mcg/1ml IM given R deltoid.  Tolerated well.  Bandaid applied.  

## 2015-09-13 ENCOUNTER — Ambulatory Visit (INDEPENDENT_AMBULATORY_CARE_PROVIDER_SITE_OTHER): Payer: Medicare Other | Admitting: *Deleted

## 2015-09-13 DIAGNOSIS — E538 Deficiency of other specified B group vitamins: Secondary | ICD-10-CM

## 2015-09-13 DIAGNOSIS — E539 Vitamin B deficiency, unspecified: Secondary | ICD-10-CM | POA: Diagnosis not present

## 2015-09-20 ENCOUNTER — Ambulatory Visit (INDEPENDENT_AMBULATORY_CARE_PROVIDER_SITE_OTHER): Payer: Medicare Other

## 2015-09-20 VITALS — BP 209/126 | HR 96 | Resp 18

## 2015-09-20 DIAGNOSIS — E539 Vitamin B deficiency, unspecified: Secondary | ICD-10-CM

## 2015-09-20 DIAGNOSIS — E538 Deficiency of other specified B group vitamins: Secondary | ICD-10-CM

## 2015-09-26 ENCOUNTER — Ambulatory Visit (INDEPENDENT_AMBULATORY_CARE_PROVIDER_SITE_OTHER): Payer: Medicare Other | Admitting: Neurology

## 2015-09-26 ENCOUNTER — Ambulatory Visit (INDEPENDENT_AMBULATORY_CARE_PROVIDER_SITE_OTHER): Payer: Self-pay | Admitting: Neurology

## 2015-09-26 DIAGNOSIS — G729 Myopathy, unspecified: Secondary | ICD-10-CM

## 2015-09-26 DIAGNOSIS — R748 Abnormal levels of other serum enzymes: Secondary | ICD-10-CM

## 2015-09-26 DIAGNOSIS — Z0289 Encounter for other administrative examinations: Secondary | ICD-10-CM

## 2015-09-26 DIAGNOSIS — R531 Weakness: Secondary | ICD-10-CM | POA: Diagnosis not present

## 2015-09-26 DIAGNOSIS — E538 Deficiency of other specified B group vitamins: Secondary | ICD-10-CM

## 2015-09-26 NOTE — Progress Notes (Signed)
Abnormal EMG today, there is evidence of inflammatory myopathic changes involving bilateral lumbar sacral, right cervical paraspinal muscles, right thoracic paraspinal muscles, similar findings also involves bilateral medial gastrocnemius, tibialis anterior, vastus lateralis.

## 2015-09-26 NOTE — Procedures (Addendum)
   NCS (NERVE CONDUCTION STUDY) WITH EMG (ELECTROMYOGRAPHY) REPORT   STUDY DATE: September 26 2015 PATIENT NAME: Randall Mccall DOB: 1971/02/27 MRN: [id number]    TECHNOLOGIST: Laretta Alstrom ELECTROMYOGRAPHER: Marcial Pacas M.D.  CLINICAL INFORMATION:  45 year old male, with history of abnormal CPK up to 1000s.  FINDINGS: NERVE CONDUCTION STUDY: Right peroneal sensory response was normal. Right peroneal to EDB, tibial motor responses were normal. Right median, ulnar sensory and motor responses were normal.  NEEDLE ELECTROMYOGRAPHY: Selective needle examinations was performed at right upper and lower extremity muscles, right cervical and lumbosacral paraspinal muscles.  Right deltoid, biceps: Increased insertional activity, no spontaneous activity, small polyspike motor unit potential, with mildly early recruitment.  Bilateral medial gastrocnemius : Increased insertional activity, 2-3 plus spontaneous activity, polyspike small motor unit potential, with early recruitment patterns.  Bilateral tibialis anterior, vastus lateralis: Increased insertional activity, no spontaneous activity, mixture of normal and some polyspike small motor unit potential, with mildly early recruitment patterns   There was increased insertional activity at paraspinal muscles, 3 plus spontaneous activity at bilateral lumbosacral paraspinals, L4-5 S1, C5-6, C7, and right thoracic paraspinal muscles right T10. Motor unit potential was small polyspike.   IMPRESSION:   This is  an abnormal study, there is electrodiagnostic evidence of cell membrane irritations involving right cervical, bilateral lumbar sacral paraspinal muscles, similar involvement of bilateral medial gastrocnemius, proximal upper and lower extremity muscles, with his elevated CPK, above findings could indicate an inflammatory myopathy, versus other myopathic changes.   INTERPRETING PHYSICIAN:   Marcial Pacas M.D. Ph.D. [facility] [address], [place], [place] [address] [phone]

## 2015-11-24 ENCOUNTER — Ambulatory Visit: Payer: Medicare Other | Admitting: Neurology

## 2015-11-28 ENCOUNTER — Encounter: Payer: Self-pay | Admitting: Neurology

## 2015-12-10 ENCOUNTER — Emergency Department (HOSPITAL_COMMUNITY)
Admission: EM | Admit: 2015-12-10 | Discharge: 2015-12-10 | Disposition: A | Discharge status: Home / Self Care | Payer: Medicare Other | Attending: Emergency Medicine | Admitting: Emergency Medicine

## 2015-12-10 ENCOUNTER — Encounter (HOSPITAL_COMMUNITY): Payer: Self-pay | Admitting: *Deleted

## 2015-12-10 DIAGNOSIS — R030 Elevated blood-pressure reading, without diagnosis of hypertension: Secondary | ICD-10-CM

## 2015-12-10 DIAGNOSIS — IMO0001 Reserved for inherently not codable concepts without codable children: Secondary | ICD-10-CM

## 2015-12-10 DIAGNOSIS — Z79899 Other long term (current) drug therapy: Secondary | ICD-10-CM | POA: Diagnosis not present

## 2015-12-10 DIAGNOSIS — I10 Essential (primary) hypertension: Secondary | ICD-10-CM | POA: Insufficient documentation

## 2015-12-10 DIAGNOSIS — M25522 Pain in left elbow: Secondary | ICD-10-CM | POA: Diagnosis present

## 2015-12-10 DIAGNOSIS — Z792 Long term (current) use of antibiotics: Secondary | ICD-10-CM | POA: Insufficient documentation

## 2015-12-10 DIAGNOSIS — M10022 Idiopathic gout, left elbow: Secondary | ICD-10-CM | POA: Insufficient documentation

## 2015-12-10 DIAGNOSIS — M109 Gout, unspecified: Secondary | ICD-10-CM

## 2015-12-10 MED ORDER — KETOROLAC TROMETHAMINE 30 MG/ML IJ SOLN
30.0000 mg | Freq: Once | INTRAMUSCULAR | Status: AC
  Administered 2015-12-10: 30 mg via INTRAMUSCULAR
  Filled 2015-12-10: qty 1

## 2015-12-10 MED ORDER — INDOMETHACIN 25 MG PO CAPS: ORAL_CAPSULE | ORAL | Status: DC

## 2015-12-10 NOTE — ED Notes (Signed)
Pt reports he woke up with pain in his LT elbow. Pt reports he can not bend LT elbow.

## 2015-12-10 NOTE — ED Notes (Signed)
Declined W/C at D/C and was escorted to lobby by RN. 

## 2015-12-10 NOTE — ED Provider Notes (Signed)
CSN: [id number]     Arrival date & time 12/10/15  1604 History  By signing my name below, I, Randall Mccall, attest that this documentation has been prepared under the direction and in the presence of [employer], [employer]. Electronically Signed: Randa Mccall, ED Scribe. 12/10/2015. 4:40 PM.    Chief Complaint  Patient presents with  . Elbow Pain   The history is provided by the patient. No language interpreter was used.   HPI Comments: Randall Mccall is a 45 y.o. male who presents to the Emergency Department complaining of left elbow and wrist pain onset this morning when waking up. Pt rates the severity of his pain as 10/10. He presents with associated swelling in the left wrist and elbow. Pt states that he has a Hx of gout and that this feels like previous gout flare up, often having gout affect two joints simultaneously.  Pt states that the pain is worse with movement. Pt states that he takes Uloric daily. Pt denies any alleviating factors including ibuprofen which he tried with has tried with no relief. Denies fever, recent illness.   Blood pressure was elevated at triage at 174/123. Patient denies headache, vision changes, myalgias, chest pain, abdominal pain. Patient is not on blood pressure medication and states his PCP has recently moved out of town, therefore he does not have a primary physician at this time.    Past Medical History  Diagnosis Date  . Hypertension   . Gout   . Muscle weakness    Past Surgical History  Procedure Laterality Date  . Knee surgery     History reviewed. No pertinent family history. Social History  Substance Use Topics  . Smoking status: Never Smoker   . Smokeless tobacco: Never Used  . Alcohol Use: 0.6 oz/week    1 Cans of beer per week    Review of Systems  Constitutional: Negative for fever.  Musculoskeletal: Positive for joint swelling and arthralgias.      Allergies  Review of patient's allergies indicates no known  allergies.  Home Medications   Prior to Admission medications   Medication Sig Start Date End Date Taking? Authorizing Provider  amLODipine-olmesartan (AZOR) 10-40 MG per tablet Take 1 tablet by mouth daily.    Historical Provider, MD  amoxicillin-clavulanate (AUGMENTIN) 875-125 MG per tablet Take 1 tablet by mouth 2 (two) times daily.    Historical Provider, MD  Cholecalciferol (VITAMIN D PO) Take 5,000 Units by mouth daily.    Historical Provider, MD  COLCRYS 0.6 MG tablet 0.6 mg. 04/14/13   Historical Provider, MD  febuxostat (ULORIC) 40 MG tablet Take 40 mg by mouth daily.    Historical Provider, MD  fluocinonide ointment (LIDEX) 0.05 %  06/30/13   Historical Provider, MD  HYDROcodone-acetaminophen (NORCO/VICODIN) 5-325 MG per tablet Take 1 tablet by mouth every 4 (four) hours as needed. For pain.    Historical Provider, MD  ibuprofen (ADVIL,MOTRIN) 800 MG tablet Take 1 tablet (800 mg total) by mouth every 8 (eight) hours as needed for pain. 05/17/12   Chauncy Passy, MD  indomethacin (INDOCIN) 25 MG capsule Take three pills (75mg ) x 1 dose, then take two caps (50mg ) three times daily x 5 days 12/10/15   [facility] Ward, PA-C  traMADol (ULTRAM) 50 MG tablet Take 1 tablet (50 mg total) by mouth every 6 (six) hours as needed for pain. 05/17/12   Meagan Hunt, MD   BP 174/123 mmHg  Pulse 105  Temp(Src) 98.4 F (  36.9 C) (Oral)  Resp 18  SpO2 97%   Physical Exam  Constitutional: He is oriented to person, place, and time. He appears well-developed and well-nourished. No distress.  HENT:  Head: Normocephalic and atraumatic.  Eyes: Conjunctivae and EOM are normal. Pupils are equal, round, and reactive to light.  Neck: Neck supple. No tracheal deviation present.  Cardiovascular: Normal rate, regular rhythm and normal heart sounds.   No murmur heard. Pulmonary/Chest: Effort normal and breath sounds normal. No respiratory distress. He has no wheezes. He has no rales.  Abdominal: Soft. He exhibits  no distension. There is no tenderness.  Musculoskeletal:  Left elbow with swelling and decreased range of motion. Generalized TTP.  Left wrist with swelling and decreased range of motion. TTP of dorsal wrist.  Upper extremity with 2+ radial pulse and sensation intact of median, radial, ulnar distribution. No erythema or warmth to the touch over swollen joints  Neurological: He is alert and oriented to person, place, and time.  Skin: Skin is warm and dry.  Psychiatric: He has a normal mood and affect. His behavior is normal.  Nursing note and vitals reviewed.   ED Course  Procedures (including critical care time) DIAGNOSTIC STUDIES: Oxygen Saturation is 97% on RA, normal by my interpretation.    COORDINATION OF CARE: 4:39 PM-Discussed treatment plan with pt at bedside and pt agreed to plan.     Labs Review Labs Reviewed - No data to display  Imaging Review No results found.    EKG Interpretation None      MDM   Final diagnoses:  Elevated blood pressure  Acute gout of left elbow, unspecified cause   Randall Mccall presents to the ED for left elbow and hand pain that began this morning. Patient states this is similar to his previous gout flares. On exam left elbow and wrist with swelling, decreased range of motion, and tenderness to palpation. No erythema or warmth to touch. Patient is on daily uloric and states has been compliant with this medication. Will treat with indomethacin.  Blood pressure was noted to be elevated today at 174/123. Patient states he does not have high blood pressure nor is he on blood pressure medication. Chart states he has a hx of  HTN and has been on Azor in the past. He is currently asymptomatic. At his last neurology appointment in December 2016, blood pressure was elevated at 170s over 100's. Patient was followed by PCP, but states his PCP recently moved and he does not have follow up. Discussed how to find a PCP with patient using medicaid card.  Patient's care was discussed with Dr. Eulis Foster who recommends educating patient on DASH diet, exercise, and PCP follow-up. This was done, return precautions were given, and all questions were answered.   I personally performed the services described in this documentation, which was scribed in my presence. The recorded information has been reviewed and is accurate.     [facility] Ward, PA-C 12/10/15 1800  Daleen Bo, MD 12/10/15 [phone]

## 2015-12-10 NOTE — Discharge Instructions (Signed)
You need to see your primary care physician in the next 5 days as we discussed. Exercise daily and follow the diet attached below. Return to ER for vision changes, headache, any new or worsening symptoms, any additional concerns.  DASH Eating Plan DASH stands for "Dietary Approaches to Stop Hypertension." The DASH eating plan is a healthy eating plan that has been shown to reduce high blood pressure (hypertension). Additional health benefits may include reducing the risk of type 2 diabetes mellitus, heart disease, and stroke. The DASH eating plan may also help with weight loss. WHAT DO I NEED TO KNOW ABOUT THE DASH EATING PLAN? For the DASH eating plan, you will follow these general guidelines:  Choose foods with a percent daily value for sodium of less than 5% (as listed on the food label).  Use salt-free seasonings or herbs instead of table salt or sea salt.  Check with your health care provider or pharmacist before using salt substitutes.  Eat lower-sodium products, often labeled as "lower sodium" or "no salt added."  Eat fresh foods.  Eat more vegetables, fruits, and low-fat dairy products.  Choose whole grains. Look for the word "whole" as the first word in the ingredient list.  Choose fish and skinless chicken or [place] more often than red meat. Limit fish, poultry, and meat to 6 oz (170 g) each day.  Limit sweets, desserts, sugars, and sugary drinks.  Choose heart-healthy fats.  Limit cheese to 1 oz (28 g) per day.  Eat more home-cooked food and less restaurant, buffet, and fast food.  Limit fried foods.  Cook foods using methods other than frying.  Limit canned vegetables. If you do use them, rinse them well to decrease the sodium.  When eating at a restaurant, ask that your food be prepared with less salt, or no salt if possible. WHAT FOODS CAN I EAT? Seek help from a dietitian for individual calorie needs. Grains Whole grain or whole wheat bread. Brown rice. Whole  grain or whole wheat pasta. Quinoa, bulgur, and whole grain cereals. Low-sodium cereals. Corn or whole wheat flour tortillas. Whole grain cornbread. Whole grain crackers. Low-sodium crackers. Vegetables Fresh or frozen vegetables (raw, steamed, roasted, or grilled). Low-sodium or reduced-sodium tomato and vegetable juices. Low-sodium or reduced-sodium tomato sauce and paste. Low-sodium or reduced-sodium canned vegetables.  Fruits All fresh, canned (in natural juice), or frozen fruits. Meat and Other Protein Products Ground beef (85% or leaner), grass-fed beef, or beef trimmed of fat. Skinless chicken or [place]. Ground chicken or [place]. Pork trimmed of fat. All fish and seafood. Eggs. Dried beans, peas, or lentils. Unsalted nuts and seeds. Unsalted canned beans. Dairy Low-fat dairy products, such as skim or 1% milk, 2% or reduced-fat cheeses, low-fat ricotta or cottage cheese, or plain low-fat yogurt. Low-sodium or reduced-sodium cheeses. Fats and Oils Tub margarines without trans fats. Light or reduced-fat mayonnaise and salad dressings (reduced sodium). Avocado. Safflower, olive, or canola oils. Natural peanut or almond butter. Other Unsalted popcorn and pretzels. The items listed above may not be a complete list of recommended foods or beverages. Contact your dietitian for more options. WHAT FOODS ARE NOT RECOMMENDED? Grains White bread. White pasta. White rice. Refined cornbread. Bagels and croissants. Crackers that contain trans fat. Vegetables Creamed or fried vegetables. Vegetables in a cheese sauce. Regular canned vegetables. Regular canned tomato sauce and paste. Regular tomato and vegetable juices. Fruits Dried fruits. Canned fruit in light or heavy syrup. Fruit juice. Meat and Other Protein Products Fatty cuts of  meat. Ribs, chicken wings, bacon, sausage, bologna, salami, chitterlings, fatback, hot dogs, bratwurst, and packaged luncheon meats. Salted nuts and seeds. Canned beans with  salt. Dairy Whole or 2% milk, cream, half-and-half, and cream cheese. Whole-fat or sweetened yogurt. Full-fat cheeses or blue cheese. Nondairy creamers and whipped toppings. Processed cheese, cheese spreads, or cheese curds. Condiments Onion and garlic salt, seasoned salt, table salt, and sea salt. Canned and packaged gravies. Worcestershire sauce. Tartar sauce. Barbecue sauce. Teriyaki sauce. Soy sauce, including reduced sodium. Steak sauce. Fish sauce. Oyster sauce. Cocktail sauce. Horseradish. Ketchup and mustard. Meat flavorings and tenderizers. Bouillon cubes. Hot sauce. Tabasco sauce. Marinades. Taco seasonings. Relishes. Fats and Oils Butter, stick margarine, lard, shortening, ghee, and bacon fat. Coconut, palm kernel, or palm oils. Regular salad dressings. Other Pickles and olives. Salted popcorn and pretzels. The items listed above may not be a complete list of foods and beverages to avoid. Contact your dietitian for more information. WHERE CAN I FIND MORE INFORMATION? National Heart, Lung, and Blood Institute: travelstabloid.com   This information is not intended to replace advice given to you by your health care provider. Make sure you discuss any questions you have with your health care provider.   Document Released: 08/02/2011 Document Revised: 09/03/2014 Document Reviewed: 06/17/2013 Elsevier Interactive Patient Education [employer].

## 2016-02-14 ENCOUNTER — Emergency Department (HOSPITAL_COMMUNITY)
Admission: EM | Admit: 2016-02-14 | Discharge: 2016-02-14 | Disposition: A | Discharge status: Home / Self Care | Payer: Medicare Other | Attending: Emergency Medicine | Admitting: Emergency Medicine

## 2016-02-14 ENCOUNTER — Encounter (HOSPITAL_COMMUNITY): Payer: Self-pay | Admitting: Emergency Medicine

## 2016-02-14 DIAGNOSIS — M25511 Pain in right shoulder: Secondary | ICD-10-CM | POA: Diagnosis not present

## 2016-02-14 DIAGNOSIS — Z79899 Other long term (current) drug therapy: Secondary | ICD-10-CM | POA: Insufficient documentation

## 2016-02-14 DIAGNOSIS — I10 Essential (primary) hypertension: Secondary | ICD-10-CM | POA: Insufficient documentation

## 2016-02-14 MED ORDER — IRBESARTAN 300 MG PO TABS
300.0000 mg | ORAL_TABLET | Freq: Every day | ORAL | Status: DC
  Administered 2016-02-14: 300 mg via ORAL
  Filled 2016-02-14: qty 1

## 2016-02-14 MED ORDER — INDOMETHACIN 25 MG PO CAPS: ORAL_CAPSULE | ORAL | Status: DC

## 2016-02-14 MED ORDER — AMLODIPINE-OLMESARTAN 10-40 MG PO TABS
1.0000 | ORAL_TABLET | Freq: Every day | ORAL | Status: DC
Start: 2016-02-14 — End: 2016-07-19

## 2016-02-14 MED ORDER — METHOCARBAMOL 500 MG PO TABS: 500.0000 mg | ORAL_TABLET | Freq: Two times a day (BID) | ORAL | Status: DC

## 2016-02-14 MED ORDER — AMLODIPINE BESYLATE 5 MG PO TABS
10.0000 mg | ORAL_TABLET | Freq: Once | ORAL | Status: AC
  Administered 2016-02-14: 10 mg via ORAL
  Filled 2016-02-14: qty 2

## 2016-02-14 MED ORDER — METHOCARBAMOL 500 MG PO TABS
1000.0000 mg | ORAL_TABLET | Freq: Once | ORAL | Status: AC
  Administered 2016-02-14: 1000 mg via ORAL
  Filled 2016-02-14: qty 2

## 2016-02-14 MED ORDER — INDOMETHACIN 25 MG PO CAPS
25.0000 mg | ORAL_CAPSULE | Freq: Once | ORAL | Status: AC
  Administered 2016-02-14: 25 mg via ORAL
  Filled 2016-02-14: qty 1

## 2016-02-14 NOTE — Consult Note (Signed)
Logan reported to Level 2 trauma and assessed immediate need.  If additional spiritual support is needed, please page the on call [facility].  >me 4:36 PM

## 2016-02-14 NOTE — ED Notes (Signed)
Pt sts right shoulder and neck pain x 3 days worse with palpation and movement

## 2016-02-14 NOTE — Discharge Instructions (Signed)
Randall Mccall,  Nice meeting you! Please follow-up with your primary care provider. Return to the emergency department if you develop chest pain, shortness of breath, nausea/vomiting, new/worsening symptoms. Feel better soon!  S. Wendie Simmer, PA-C   Cryotherapy Cryotherapy means treatment with cold. Ice or gel packs can be used to reduce both pain and swelling. Ice is the most helpful within the first 24 to 48 hours after an injury or flare-up from overusing a muscle or joint. Sprains, strains, spasms, burning pain, shooting pain, and aches can all be eased with ice. Ice can also be used when recovering from surgery. Ice is effective, has very few side effects, and is safe for most people to use. PRECAUTIONS  Ice is not a safe treatment option for people with:  Raynaud phenomenon. This is a condition affecting small blood vessels in the extremities. Exposure to cold may cause your problems to return.  Cold hypersensitivity. There are many forms of cold hypersensitivity, including:  Cold urticaria. Red, itchy hives appear on the skin when the tissues begin to warm after being iced.  Cold erythema. This is a red, itchy rash caused by exposure to cold.  Cold hemoglobinuria. Red blood cells break down when the tissues begin to warm after being iced. The hemoglobin that carry oxygen are passed into the urine because they cannot combine with blood proteins fast enough.  Numbness or altered sensitivity in the area being iced. If you have any of the following conditions, do not use ice until you have discussed cryotherapy with your caregiver:  Heart conditions, such as arrhythmia, angina, or chronic heart disease.  High blood pressure.  Healing wounds or open skin in the area being iced.  Current infections.  Rheumatoid arthritis.  Poor circulation.  Diabetes. Ice slows the blood flow in the region it is applied. This is beneficial when trying to stop inflamed tissues from  spreading irritating chemicals to surrounding tissues. However, if you expose your skin to cold temperatures for too long or without the proper protection, you can damage your skin or nerves. Watch for signs of skin damage due to cold. HOME CARE INSTRUCTIONS Follow these tips to use ice and cold packs safely.  Place a dry or damp towel between the ice and skin. A damp towel will cool the skin more quickly, so you may need to shorten the time that the ice is used.  For a more rapid response, add gentle compression to the ice.  Ice for no more than 10 to 20 minutes at a time. The bonier the area you are icing, the less time it will take to get the benefits of ice.  Check your skin after 5 minutes to make sure there are no signs of a poor response to cold or skin damage.  Rest 20 minutes or more between uses.  Once your skin is numb, you can end your treatment. You can test numbness by very lightly touching your skin. The touch should be so light that you do not see the skin dimple from the pressure of your fingertip. When using ice, most people will feel these normal sensations in this order: cold, burning, aching, and numbness.  Do not use ice on someone who cannot communicate their responses to pain, such as small children or people with dementia. HOW TO MAKE AN ICE PACK Ice packs are the most common way to use ice therapy. Other methods include ice massage, ice baths, and cryosprays. Muscle creams that cause a  cold, tingly feeling do not offer the same benefits that ice offers and should not be used as a substitute unless recommended by your caregiver. To make an ice pack, do one of the following:  Place crushed ice or a bag of frozen vegetables in a sealable plastic bag. Squeeze out the excess air. Place this bag inside another plastic bag. Slide the bag into a pillowcase or place a damp towel between your skin and the bag.  Mix 3 parts water with 1 part rubbing alcohol. Freeze the mixture  in a sealable plastic bag. When you remove the mixture from the freezer, it will be slushy. Squeeze out the excess air. Place this bag inside another plastic bag. Slide the bag into a pillowcase or place a damp towel between your skin and the bag. SEEK MEDICAL CARE IF:  You develop white spots on your skin. This may give the skin a blotchy (mottled) appearance.  Your skin turns blue or pale.  Your skin becomes waxy or hard.  Your swelling gets worse. MAKE SURE YOU:   Understand these instructions.  Will watch your condition.  Will get help right away if you are not doing well or get worse.   This information is not intended to replace advice given to you by your health care provider. Make sure you discuss any questions you have with your health care provider.   Document Released: 04/09/2011 Document Revised: 09/03/2014 Document Reviewed: 04/09/2011 Elsevier Interactive Patient Education [employer].

## 2016-02-14 NOTE — ED Provider Notes (Signed)
CSN: [id number]     Arrival date & time 02/14/16  1533 History   By signing my name below, I, Rowan Blase, attest that this documentation has been prepared under the direction and in the presence of non-physician practitioner, [place] Lions, [place]. Electronically Signed: Rowan Blase, Scribe. 02/14/2016. 4:15 PM.   Chief Complaint  Patient presents with  . Shoulder Pain   The history is provided by the patient. No language interpreter was used.   HPI Comments:  Randall Mccall is a 45 y.o. male with PMHx of HTN and gout who presents to the Emergency Department complaining of intermittent, shooting right shoulder pain and neck pain when turning his neck to the left onset a few days ago. He states current pain does not feel like gout. No alleviating factors noted or treatments attempted PTA. Denies doing any heavy lifting, injury, trauma, fever, chills, nausea, vomiting, chest pain, shortness of breath or h/o renal problems.  Past Medical History  Diagnosis Date  . Hypertension   . Gout   . Muscle weakness    Past Surgical History  Procedure Laterality Date  . Knee surgery     History reviewed. No pertinent family history. Social History  Substance Use Topics  . Smoking status: Never Smoker   . Smokeless tobacco: Never Used  . Alcohol Use: 0.6 oz/week    1 Cans of beer per week    Review of Systems  10 systems reviewed and all are negative for acute change except as noted in the HPI.  Allergies  Review of patient's allergies indicates no known allergies.  Home Medications   Prior to Admission medications   Medication Sig Start Date End Date Taking? Authorizing Provider  amLODipine-olmesartan (AZOR) 10-40 MG per tablet Take 1 tablet by mouth daily.    Historical Provider, MD  amoxicillin-clavulanate (AUGMENTIN) 875-125 MG per tablet Take 1 tablet by mouth 2 (two) times daily.    Historical Provider, MD  Cholecalciferol (VITAMIN D PO) Take 5,000 Units by mouth  daily.    Historical Provider, MD  COLCRYS 0.6 MG tablet 0.6 mg. 04/14/13   Historical Provider, MD  febuxostat (ULORIC) 40 MG tablet Take 40 mg by mouth daily.    Historical Provider, MD  fluocinonide ointment (LIDEX) 0.05 %  06/30/13   Historical Provider, MD  HYDROcodone-acetaminophen (NORCO/VICODIN) 5-325 MG per tablet Take 1 tablet by mouth every 4 (four) hours as needed. For pain.    Historical Provider, MD  ibuprofen (ADVIL,MOTRIN) 800 MG tablet Take 1 tablet (800 mg total) by mouth every 8 (eight) hours as needed for pain. 05/17/12   Chauncy Passy, MD  indomethacin (INDOCIN) 25 MG capsule Take three pills (75mg ) x 1 dose, then take two caps (50mg ) three times daily x 5 days 12/10/15   [facility] Ward, PA-C  traMADol (ULTRAM) 50 MG tablet Take 1 tablet (50 mg total) by mouth every 6 (six) hours as needed for pain. 05/17/12   Meagan Hunt, MD   BP 176/116 mmHg  Pulse 90  Temp(Src) 98.7 F (37.1 C) (Oral)  Resp 16  Ht 6\' 1"  (1.854 m)  Wt 270 lb (122.471 kg)  BMI 35.63 kg/m2  SpO2 99% Physical Exam  Constitutional: He appears well-developed and well-nourished. No distress.  HENT:  Head: Normocephalic and atraumatic.  Eyes: Conjunctivae are normal. Right eye exhibits no discharge. Left eye exhibits no discharge. No scleral icterus.  Neck: No tracheal deviation present.  Cardiovascular: Normal rate and intact distal pulses.   Pulmonary/Chest:  Effort normal. No respiratory distress.  Abdominal: Soft. He exhibits no distension.  Musculoskeletal: Normal range of motion. He exhibits tenderness. He exhibits no edema.  Right superior trapezius muscle tenderness. Normal range of motion at right shoulder. Neurovascularly intact bilaterally. No erythema.  Lymphadenopathy:    He has no cervical adenopathy.  Neurological: He is alert. Coordination normal.  Skin: Skin is warm and dry. No rash noted. He is not diaphoretic. No erythema.  Psychiatric: He has a normal mood and affect. His behavior is  normal.  Nursing note and vitals reviewed.   ED Course  Procedures  DIAGNOSTIC STUDIES:  Oxygen Saturation is 99% on RA, normal by my interpretation.    COORDINATION OF CARE:  4:13 PM Will start on muscle relaxer and refill antiinflammatory. Discussed treatment plan with pt at bedside and pt agreed to plan.   MDM   Final diagnoses:  Right shoulder pain  Essential hypertension   Patient nontoxic-appearing, vital signs stable. Patient has chronic hypertension. He denies any chest pain, shortness of breath, nausea-doubt ACS. No concern for septic joint, as patient is afebrile/nontoxic appearing, and joint does not appear edematous or erythematous. Will prescribe RICE protocol, anti-inflammatory (patient endorses active gout flareup in hand and is out of indomethacin), muscle relaxer. Refilled HTN meds and gave dose here. Patient may be safely discharged home. Discussed reasons for return. Patient to follow-up with primary care provider within one week. Patient in understanding and agreement with the plan.  I personally performed the services described in this documentation, which was scribed in my presence. The recorded information has been reviewed and is accurate.   [place] Lions, PA-C 02/14/16 [place], PA-C 02/14/16 [place], PA-C 02/14/16 1720  Pattricia Boss, MD 02/15/16 1323

## 2016-05-01 ENCOUNTER — Ambulatory Visit: Payer: Medicare Other | Admitting: Neurology

## 2016-06-15 ENCOUNTER — Emergency Department (HOSPITAL_COMMUNITY)
Admission: EM | Admit: 2016-06-15 | Discharge: 2016-06-15 | Disposition: A | Discharge status: Home / Self Care | Payer: Medicare Other | Attending: Emergency Medicine | Admitting: Emergency Medicine

## 2016-06-15 ENCOUNTER — Encounter (HOSPITAL_COMMUNITY): Payer: Self-pay

## 2016-06-15 DIAGNOSIS — I10 Essential (primary) hypertension: Secondary | ICD-10-CM

## 2016-06-15 DIAGNOSIS — M10072 Idiopathic gout, left ankle and foot: Secondary | ICD-10-CM | POA: Insufficient documentation

## 2016-06-15 DIAGNOSIS — M1000 Idiopathic gout, unspecified site: Secondary | ICD-10-CM

## 2016-06-15 DIAGNOSIS — M79672 Pain in left foot: Secondary | ICD-10-CM | POA: Diagnosis present

## 2016-06-15 MED ORDER — CLONIDINE HCL 0.1 MG PO TABS: 0.1000 mg | ORAL_TABLET | Freq: Two times a day (BID) | ORAL | 0 refills | Status: DC

## 2016-06-15 MED ORDER — IBUPROFEN 400 MG PO TABS
600.0000 mg | ORAL_TABLET | Freq: Once | ORAL | Status: AC
  Administered 2016-06-15: 600 mg via ORAL
  Filled 2016-06-15: qty 1

## 2016-06-15 MED ORDER — AMLODIPINE BESYLATE 10 MG PO TABS: 10.0000 mg | ORAL_TABLET | Freq: Every day | ORAL | 1 refills | Status: DC

## 2016-06-15 MED ORDER — AMLODIPINE BESYLATE 5 MG PO TABS
10.0000 mg | ORAL_TABLET | Freq: Once | ORAL | Status: AC
  Administered 2016-06-15: 10 mg via ORAL
  Filled 2016-06-15: qty 2

## 2016-06-15 MED ORDER — CLONIDINE HCL 0.2 MG PO TABS
0.2000 mg | ORAL_TABLET | Freq: Once | ORAL | Status: AC
  Administered 2016-06-15: 0.2 mg via ORAL
  Filled 2016-06-15: qty 1

## 2016-06-15 MED ORDER — NAPROXEN 500 MG PO TABS: 500.0000 mg | ORAL_TABLET | Freq: Two times a day (BID) | ORAL | 0 refills | Status: DC | PRN

## 2016-06-15 MED ORDER — ALLOPURINOL 100 MG PO TABS: ORAL_TABLET | ORAL | 1 refills | Status: DC

## 2016-06-15 MED ORDER — CLONIDINE HCL 0.1 MG PO TABS
0.1000 mg | ORAL_TABLET | Freq: Once | ORAL | Status: AC
  Administered 2016-06-15: 0.1 mg via ORAL
  Filled 2016-06-15: qty 1

## 2016-06-15 MED ORDER — DEXAMETHASONE SODIUM PHOSPHATE 10 MG/ML IJ SOLN
10.0000 mg | Freq: Once | INTRAMUSCULAR | Status: AC
  Administered 2016-06-15: 10 mg via INTRAMUSCULAR
  Filled 2016-06-15: qty 1

## 2016-06-15 MED ORDER — HYDROCODONE-ACETAMINOPHEN 5-325 MG PO TABS
1.0000 | ORAL_TABLET | Freq: Once | ORAL | Status: AC
  Administered 2016-06-15: 1 via ORAL
  Filled 2016-06-15: qty 1

## 2016-06-15 NOTE — ED Triage Notes (Signed)
Pt complaining of gout in L foot and R hand. Pt also complaining of muscle spasms in the R shoulder. Pt states normally see Polite, MD. Takes Allpuronol. Pt states out of medication x 1 month. Pt hypertensive at triage. Pt also states out of htn medication as well.

## 2016-06-15 NOTE — Discharge Instructions (Signed)
If you were given medicines take as directed.  If you are on coumadin or contraceptives realize their levels and effectiveness is altered by many different medicines.  If you have any reaction (rash, tongues swelling, other) to the medicines stop taking and see a physician.    If your blood pressure was elevated in the ER make sure you follow up for management with a primary doctor or return for chest pain, shortness of breath or stroke symptoms.  Please follow up as directed and return to the ER or see a physician for new or worsening symptoms.  Thank you. Vitals:   06/15/16 1829 06/15/16 2115 06/15/16 2215 06/15/16 2245  BP:  (!) 182/119 (!) 181/121 (!) 141/103  Pulse:  86 86 86  Resp:      Temp:      TempSrc:      SpO2:  98% 96% 98%  Weight: 247 lb 11.2 oz (112.4 kg)     Height: 6\' 1"  (1.854 m)

## 2016-06-15 NOTE — ED Notes (Signed)
Pt and family understood dc material. NAD Noted. Scripts given at dc 

## 2016-06-15 NOTE — ED Provider Notes (Signed)
[place] DEPT Provider Note   CSN: [id number] Arrival date & time: 06/15/16  1825     History   Chief Complaint Chief Complaint  Patient presents with  . Gout  . Hypertension    HPI Randall Mccall is a 45 y.o. male.  Patient presents with pain in the left foot and right hand similar to previous gout. Patient has history of gout and was taking out. All another medicines however he ran out of his blood pressure meds and his gout medicines. Patient does have a primary doctor to follow-up with. No injuries. No fevers or rash.      Past Medical History:  Diagnosis Date  . Gout   . Hypertension   . Muscle weakness     Patient Active Problem List   Diagnosis Date Noted  . Vitamin B12 deficiency 08/10/2015  . Weakness 07/02/2013  . Abnormal CPK 07/02/2013    Past Surgical History:  Procedure Laterality Date  . KNEE SURGERY         Home Medications    Prior to Admission medications   Medication Sig Start Date End Date Taking? Authorizing Provider  amLODipine-olmesartan (AZOR) 10-40 MG tablet Take 1 tablet by mouth daily. 02/14/16  Yes [place] Lions, PA-C  febuxostat (ULORIC) 40 MG tablet Take 40 mg by mouth daily.   Yes Historical Provider, MD  ibuprofen (ADVIL,MOTRIN) 800 MG tablet Take 1 tablet (800 mg total) by mouth every 8 (eight) hours as needed for pain. 05/17/12  Yes Chauncy Passy, MD  allopurinol (ZYLOPRIM) 100 MG tablet Take 100 mg or 1 tablet once per day for the next week then increase to 2 tablets per day for the second week followed by 3 tablets per day for the third week. 06/15/16   Elnora Morrison, MD  amLODipine (NORVASC) 10 MG tablet Take 1 tablet (10 mg total) by mouth daily. 06/15/16   Elnora Morrison, MD  cloNIDine (CATAPRES) 0.1 MG tablet Take 1 tablet (0.1 mg total) by mouth 2 (two) times daily. 06/15/16   Elnora Morrison, MD  indomethacin (INDOCIN) 25 MG capsule Take three pills (75mg ) x 1 dose, then take two caps (50mg ) three times daily x  5 days Patient not taking: Reported on 06/15/2016 02/14/16   [place] Lions, PA-C  methocarbamol (ROBAXIN) 500 MG tablet Take 1 tablet (500 mg total) by mouth 2 (two) times daily. Patient not taking: Reported on 06/15/2016 02/14/16   [place] Lions, PA-C  naproxen (NAPROSYN) 500 MG tablet Take 1 tablet (500 mg total) by mouth 2 (two) times daily as needed. 06/15/16   Elnora Morrison, MD  traMADol (ULTRAM) 50 MG tablet Take 1 tablet (50 mg total) by mouth every 6 (six) hours as needed for pain. Patient not taking: Reported on 06/15/2016 05/17/12   Chauncy Passy, MD    Family History History reviewed. No pertinent family history.  Social History Social History  Substance Use Topics  . Smoking status: Never Smoker  . Smokeless tobacco: Never Used  . Alcohol use 0.6 oz/week    1 Cans of beer per week     Allergies   Review of patient's allergies indicates no known allergies.   Review of Systems Review of Systems  Constitutional: Positive for chills. Negative for fever.  HENT: Negative for congestion.   Eyes: Negative for visual disturbance.  Respiratory: Negative for shortness of breath.   Cardiovascular: Negative for chest pain.  Gastrointestinal: Negative for abdominal pain and vomiting.  Genitourinary: Negative for dysuria and  flank pain.  Musculoskeletal: Positive for arthralgias. Negative for back pain, neck pain and neck stiffness.  Skin: Negative for rash.  Neurological: Negative for light-headedness and headaches.     Physical Exam Updated Vital Signs BP (!) 163/110   Pulse 87   Temp 98.5 F (36.9 C) (Oral)   Resp 18   Ht 6\' 1"  (1.854 m)   Wt 247 lb 11.2 oz (112.4 kg)   SpO2 92%   BMI 32.68 kg/m   Physical Exam  Constitutional: He is oriented to person, place, and time. He appears well-developed and well-nourished.  HENT:  Head: Normocephalic and atraumatic.  Eyes: Conjunctivae are normal. Right eye exhibits no discharge. Left eye exhibits no  discharge.  Neck: Normal range of motion. Neck supple. No tracheal deviation present.  Cardiovascular: Normal rate and regular rhythm.   Pulmonary/Chest: Effort normal.  Abdominal: Soft.  Musculoskeletal: He exhibits edema and tenderness.  Patient has mild tenderness to the dorsum left distal medial foot most significant at left great toe. No streaking erythema mild warmth. Patient has no other significant swelling or tenderness on exam.  Neurological: He is alert and oriented to person, place, and time.  Skin: Skin is warm. No rash noted.  Psychiatric: He has a normal mood and affect.  Nursing note and vitals reviewed.    ED Treatments / Results  Labs (all labs ordered are listed, but only abnormal results are displayed) Labs Reviewed - No data to display  EKG  EKG Interpretation None       Radiology No results found.  Procedures Procedures (including critical care time)  Medications Ordered in ED Medications  amLODipine (NORVASC) tablet 10 mg (10 mg Oral Given 06/15/16 2151)  cloNIDine (CATAPRES) tablet 0.1 mg (0.1 mg Oral Given 06/15/16 2151)  ibuprofen (ADVIL,MOTRIN) tablet 600 mg (600 mg Oral Given 06/15/16 2151)  dexamethasone (DECADRON) injection 10 mg (10 mg Intramuscular Given 06/15/16 2151)  HYDROcodone-acetaminophen (NORCO/VICODIN) 5-325 MG per tablet 1 tablet (1 tablet Oral Given 06/15/16 2301)  cloNIDine (CATAPRES) tablet 0.2 mg (0.2 mg Oral Given 06/15/16 2301)     Initial Impression / Assessment and Plan / ED Course  I have reviewed the triage vital signs and the nursing notes.  Pertinent labs & imaging results that were available during my care of the patient were reviewed by me and considered in my medical decision making (see chart for details).  Clinical Course   Patient presents with uncontrolled high blood pressure secondary to noncompliance of medications. No signs or symptoms of endorgan damage. Plan for oral blood pressure medications. Patient  has gout flare similar to previous. No fevers or clinical signs of septic joint at this time. Discussed outpatient follow-up with primary doctor.  Results and differential diagnosis were discussed with the patient/parent/guardian. Xrays were independently reviewed by myself.  Close follow up outpatient was discussed, comfortable with the plan.   Medications  amLODipine (NORVASC) tablet 10 mg (10 mg Oral Given 06/15/16 2151)  cloNIDine (CATAPRES) tablet 0.1 mg (0.1 mg Oral Given 06/15/16 2151)  ibuprofen (ADVIL,MOTRIN) tablet 600 mg (600 mg Oral Given 06/15/16 2151)  dexamethasone (DECADRON) injection 10 mg (10 mg Intramuscular Given 06/15/16 2151)  HYDROcodone-acetaminophen (NORCO/VICODIN) 5-325 MG per tablet 1 tablet (1 tablet Oral Given 06/15/16 2301)  cloNIDine (CATAPRES) tablet 0.2 mg (0.2 mg Oral Given 06/15/16 2301)    Vitals:   06/15/16 2215 06/15/16 2245 06/15/16 2302 06/15/16 2334  BP: (!) 181/121 (!) 141/103 (!) 163/110   Pulse: 86 86  87  Resp:    18  Temp:      TempSrc:      SpO2: 96% 98%  92%  Weight:      Height:        Final diagnoses:  Acute idiopathic gout, unspecified site  Essential hypertension     Final Clinical Impressions(s) / ED Diagnoses   Final diagnoses:  Acute idiopathic gout, unspecified site  Essential hypertension    New Prescriptions Discharge Medication List as of 06/15/2016 11:37 PM    START taking these medications   Details  allopurinol (ZYLOPRIM) 100 MG tablet Take 100 mg or 1 tablet once per day for the next week then increase to 2 tablets per day for the second week followed by 3 tablets per day for the third week., Print    amLODipine (NORVASC) 10 MG tablet Take 1 tablet (10 mg total) by mouth daily., Starting Fri 06/15/2016, Print    cloNIDine (CATAPRES) 0.1 MG tablet Take 1 tablet (0.1 mg total) by mouth 2 (two) times daily., Starting Fri 06/15/2016, Print    naproxen (NAPROSYN) 500 MG tablet Take 1 tablet (500 mg total) by  mouth 2 (two) times daily as needed., Starting Fri 06/15/2016, Print         Elnora Morrison, MD 06/16/16 1538

## 2016-06-20 ENCOUNTER — Ambulatory Visit (INDEPENDENT_AMBULATORY_CARE_PROVIDER_SITE_OTHER): Payer: Medicare Other | Admitting: Sports Medicine

## 2016-07-19 ENCOUNTER — Emergency Department (HOSPITAL_COMMUNITY): Payer: Medicare Other

## 2016-07-19 ENCOUNTER — Encounter (HOSPITAL_COMMUNITY): Payer: Self-pay | Admitting: Emergency Medicine

## 2016-07-19 ENCOUNTER — Emergency Department (HOSPITAL_COMMUNITY)
Admission: EM | Admit: 2016-07-19 | Discharge: 2016-07-19 | Disposition: A | Discharge status: Home / Self Care | Payer: Medicare Other | Attending: Emergency Medicine | Admitting: Emergency Medicine

## 2016-07-19 DIAGNOSIS — I10 Essential (primary) hypertension: Secondary | ICD-10-CM | POA: Diagnosis not present

## 2016-07-19 DIAGNOSIS — Z79899 Other long term (current) drug therapy: Secondary | ICD-10-CM | POA: Insufficient documentation

## 2016-07-19 DIAGNOSIS — M25561 Pain in right knee: Secondary | ICD-10-CM | POA: Diagnosis not present

## 2016-07-19 DIAGNOSIS — M10061 Idiopathic gout, right knee: Secondary | ICD-10-CM | POA: Diagnosis not present

## 2016-07-19 DIAGNOSIS — M25532 Pain in left wrist: Secondary | ICD-10-CM | POA: Diagnosis not present

## 2016-07-19 DIAGNOSIS — M109 Gout, unspecified: Secondary | ICD-10-CM | POA: Diagnosis not present

## 2016-07-19 DIAGNOSIS — M79606 Pain in leg, unspecified: Secondary | ICD-10-CM | POA: Diagnosis not present

## 2016-07-19 LAB — CBC
HCT: 41.4 % (ref 39.0–52.0)
Hemoglobin: 14.3 g/dL (ref 13.0–17.0)
MCH: 31 pg (ref 26.0–34.0)
MCHC: 34.5 g/dL (ref 30.0–36.0)
MCV: 89.8 fL (ref 78.0–100.0)
Platelets: 485 10*3/uL — ABNORMAL HIGH (ref 150–400)
RBC: 4.61 MIL/uL (ref 4.22–5.81)
RDW: 12.6 % (ref 11.5–15.5)
WBC: 10.8 10*3/uL — ABNORMAL HIGH (ref 4.0–10.5)

## 2016-07-19 LAB — BASIC METABOLIC PANEL
Anion gap: 9 (ref 5–15)
BUN: 16 mg/dL (ref 6–20)
CO2: 24 mmol/L (ref 22–32)
Calcium: 9.6 mg/dL (ref 8.9–10.3)
Chloride: 100 mmol/L — ABNORMAL LOW (ref 101–111)
Creatinine, Ser: 1.08 mg/dL (ref 0.61–1.24)
GFR calc Af Amer: >60 mL/min (ref >60)
GFR calc non Af Amer: >60 mL/min (ref >60)
Glucose, Bld: 120 mg/dL — ABNORMAL HIGH (ref 65–99)
Potassium: 4 mmol/L (ref 3.5–5.1)
Sodium: 133 mmol/L — ABNORMAL LOW (ref 135–145)

## 2016-07-19 MED ORDER — CLONIDINE HCL 0.1 MG PO TABS
0.1000 mg | ORAL_TABLET | Freq: Two times a day (BID) | ORAL | 0 refills | Status: DC
Start: 2016-07-19 — End: 2016-09-17

## 2016-07-19 MED ORDER — DEXAMETHASONE SODIUM PHOSPHATE 10 MG/ML IJ SOLN
10.0000 mg | Freq: Once | INTRAMUSCULAR | Status: AC
  Administered 2016-07-19: 10 mg via INTRAMUSCULAR
  Filled 2016-07-19: qty 1

## 2016-07-19 MED ORDER — CLONIDINE HCL 0.1 MG PO TABS
0.1000 mg | ORAL_TABLET | Freq: Once | ORAL | Status: AC
  Administered 2016-07-19: 0.1 mg via ORAL
  Filled 2016-07-19: qty 1

## 2016-07-19 MED ORDER — OXYCODONE-ACETAMINOPHEN 5-325 MG PO TABS: 1.0000 | ORAL_TABLET | Freq: Three times a day (TID) | ORAL | 0 refills | Status: DC | PRN

## 2016-07-19 MED ORDER — OXYCODONE-ACETAMINOPHEN 5-325 MG PO TABS
1.0000 | ORAL_TABLET | Freq: Once | ORAL | Status: AC
  Administered 2016-07-19: 1 via ORAL
  Filled 2016-07-19: qty 1

## 2016-07-19 MED ORDER — COLCHICINE 0.6 MG PO TABS
1.2000 mg | ORAL_TABLET | Freq: Once | ORAL | Status: AC
  Administered 2016-07-19: 1.2 mg via ORAL
  Filled 2016-07-19: qty 2

## 2016-07-19 MED ORDER — AMLODIPINE BESYLATE 5 MG PO TABS
10.0000 mg | ORAL_TABLET | Freq: Once | ORAL | Status: AC
  Administered 2016-07-19: 10 mg via ORAL
  Filled 2016-07-19: qty 2

## 2016-07-19 MED ORDER — IBUPROFEN 200 MG PO TABS
600.0000 mg | ORAL_TABLET | Freq: Once | ORAL | Status: DC
  Filled 2016-07-19: qty 3

## 2016-07-19 MED ORDER — COLCHICINE 0.6 MG PO TABS: 0.6000 mg | ORAL_TABLET | Freq: Every day | ORAL | 0 refills | Status: DC

## 2016-07-19 MED ORDER — AMLODIPINE BESYLATE 10 MG PO TABS: 10.0000 mg | ORAL_TABLET | Freq: Every day | ORAL | 0 refills | Status: DC

## 2016-07-19 NOTE — ED Notes (Signed)
Bed: [id number] Expected date:  Expected time:  Means of arrival:  Comments: EMS- 45yo M, BLE pain

## 2016-07-19 NOTE — ED Triage Notes (Addendum)
Pt reports he ran out of his gout medication and is now having a flare-up. Has pain over whole body, but worst in R knee. Also out of BP medications and is hypertensive

## 2016-07-19 NOTE — Discharge Instructions (Signed)
Please read and follow all provided instructions.  Your diagnoses today include:  1. Acute idiopathic gout of right knee   2. Hypertension, unspecified type    Tests performed today include: Vital signs. See below for your results today.   Medications prescribed:  Take as prescribed   Home care instructions:  Follow any educational materials contained in this packet.  Follow-up instructions: Please follow-up with your primary care provider for further evaluation of symptoms and treatment   Return instructions:  Please return to the Emergency Department if you do not get better, if you get worse, or new symptoms OR  - Fever (temperature greater than 101.88F)  - Bleeding that does not stop with holding pressure to the area    -Severe pain (please note that you may be more sore the day after your accident)  - Chest Pain  - Difficulty breathing  - Severe nausea or vomiting  - Inability to tolerate food and liquids  - Passing out  - Skin becoming red around your wounds  - Change in mental status (confusion or lethargy)  - New numbness or weakness    Please return if you have any other emergent concerns.  Additional Information:  Your vital signs today were: BP (!) 158/105    Pulse 118    Temp 98.2 F (36.8 C) (Oral)    Resp 16    SpO2 98%  If your blood pressure (BP) was elevated above 135/85 this visit, please have this repeated by your doctor within one month. ---------------

## 2016-07-19 NOTE — ED Provider Notes (Signed)
[facility] DEPT Provider Note   CSN: [id number] Arrival date & time: 07/19/16  1238  By signing my name below, I, Arianna Nassar, attest that this documentation has been prepared under the direction and in the presence of Shary Decamp, PA-C.  Electronically Signed: Julien Nordmann, ED Scribe. 07/19/16. 12:59 PM.  History   Chief Complaint Chief Complaint  Patient presents with  . Leg Pain    The history is provided by the patient and the EMS personnel. No language interpreter was used.   HPI Comments: Randall Mccall is a 45 y.o. male brought in by ambulance, who has a PMhx of gout, HTN, and weakness presents to the Emergency Department complaining of a recurrent, moderate gout flare up x 2 days. Hx same. Noted improvement with steroids on last visit. Pt has generalized myalgias but states is pain is worse in his right knee pain and left wrist with mild swelling. He expresses that movement increases his pain. He is currently on allopurinol for his gout and states he took it this morning without relief. There have been no injury or trauma noted. Pt further notes that he ran out of Azor for his blood pressure. He denies fever, chest pain, or shortness of breath. No other symptoms noted.   Past Medical History:  Diagnosis Date  . Gout   . Hypertension   . Muscle weakness     Patient Active Problem List   Diagnosis Date Noted  . Vitamin B12 deficiency 08/10/2015  . Weakness 07/02/2013  . Abnormal CPK 07/02/2013    Past Surgical History:  Procedure Laterality Date  . KNEE SURGERY       Home Medications    Prior to Admission medications   Medication Sig Start Date End Date Taking? Authorizing Provider  allopurinol (ZYLOPRIM) 100 MG tablet Take 100 mg or 1 tablet once per day for the next week then increase to 2 tablets per day for the second week followed by 3 tablets per day for the third week. 06/15/16   Elnora Morrison, MD  amLODipine (NORVASC) 10 MG tablet Take 1 tablet (10  mg total) by mouth daily. 06/15/16   Elnora Morrison, MD  amLODipine-olmesartan (AZOR) 10-40 MG tablet Take 1 tablet by mouth daily. 02/14/16   [place] Lions, PA-C  cloNIDine (CATAPRES) 0.1 MG tablet Take 1 tablet (0.1 mg total) by mouth 2 (two) times daily. 06/15/16   Elnora Morrison, MD  febuxostat (ULORIC) 40 MG tablet Take 40 mg by mouth daily.    Historical Provider, MD  ibuprofen (ADVIL,MOTRIN) 800 MG tablet Take 1 tablet (800 mg total) by mouth every 8 (eight) hours as needed for pain. 05/17/12   Chauncy Passy, MD  indomethacin (INDOCIN) 25 MG capsule Take three pills (75mg ) x 1 dose, then take two caps (50mg ) three times daily x 5 days Patient not taking: Reported on 06/15/2016 02/14/16   [place] Lions, PA-C  methocarbamol (ROBAXIN) 500 MG tablet Take 1 tablet (500 mg total) by mouth 2 (two) times daily. Patient not taking: Reported on 06/15/2016 02/14/16   [place] Lions, PA-C  naproxen (NAPROSYN) 500 MG tablet Take 1 tablet (500 mg total) by mouth 2 (two) times daily as needed. 06/15/16   Elnora Morrison, MD  traMADol (ULTRAM) 50 MG tablet Take 1 tablet (50 mg total) by mouth every 6 (six) hours as needed for pain. Patient not taking: Reported on 06/15/2016 05/17/12   Chauncy Passy, MD    Family History No family history on file.  Social History Social History  Substance Use Topics  . Smoking status: Never Smoker  . Smokeless tobacco: Never Used  . Alcohol use 0.6 oz/week    1 Cans of beer per week     Allergies   Patient has no known allergies.   Review of Systems Review of Systems  A complete 10 system review of systems was obtained and all systems are negative except as noted in the HPI and PMH.    Physical Exam Updated Vital Signs BP (!) 158/105   Pulse 118   Temp 98.2 F (36.8 C) (Oral)   Resp 16   SpO2 98%   Physical Exam  Constitutional: He is oriented to person, place, and time. Vital signs are normal. He appears well-developed and  well-nourished.  HENT:  Head: Normocephalic.  Right Ear: Hearing normal.  Left Ear: Hearing normal.  Eyes: Conjunctivae and EOM are normal. Pupils are equal, round, and reactive to light.  Neck: Normal range of motion.  Cardiovascular: Regular rhythm.  Tachycardia present.   Pulmonary/Chest: Effort normal.  Abdominal: He exhibits no distension.  Musculoskeletal: Normal range of motion.  Right knee has limited ROM due to pain but no significant sign of edema or erythema, no indication of septic joint Left wrist with mild swelling, no erythema, ROM intact, extremities x 4, neurovascularly intact  Neurological: He is alert and oriented to person, place, and time.  Skin: Skin is warm and dry.  Psychiatric: He has a normal mood and affect. His speech is normal and behavior is normal. Thought content normal.  Nursing note and vitals reviewed.  ED Treatments / Results  DIAGNOSTIC STUDIES: Oxygen Saturation is 98% on RA, normal by my interpretation.  COORDINATION OF CARE: 12:45 PM Discussed treatment plan with pt at bedside and pt agreed to plan.  Labs (all labs ordered are listed, but only abnormal results are displayed) Labs Reviewed  CBC - Abnormal; Notable for the following:       Result Value   WBC 10.8 (*)    Platelets 485 (*)    All other components within normal limits  BASIC METABOLIC PANEL - Abnormal; Notable for the following:    Sodium 133 (*)    Chloride 100 (*)    Glucose, Bld 120 (*)    All other components within normal limits   EKG  EKG Interpretation None      Radiology Dg Wrist Complete Left  Result Date: 07/19/2016 CLINICAL DATA:  Recurrent gout flare up 2 days. Improvement with steroid some last visit. Pain worse over right knee and left wrist. EXAM: LEFT WRIST - COMPLETE 3+ VIEW COMPARISON:  09/08/2010 FINDINGS: Examination demonstrates moderate to severe degenerative changes over the radiocarpal joint and distal radial ulnar joint the with mild interval  progression. Subchondral cysts and possible mild erosive change over the carpal bones. Degenerative change over the carpal bones and first carpometacarpal joint. Degenerative change over the first MCP joint. No acute fracture or dislocation. IMPRESSION: Moderate degenerative changes as described worse over the radiocarpal joint and carpal bones. Findings may be due to the osteoarthritis versus inflammatory arthropathy. Electronically Signed   By: Marin Olp M.D.   On: 07/19/2016 14:05   Dg Knee Complete 4 Views Right  Result Date: 07/19/2016 CLINICAL DATA:  Recurrent gout flare of 2 days. Improvement with steroids on last visit. Pain worse over right knee and left wrist. EXAM: RIGHT KNEE - COMPLETE 4+ VIEW COMPARISON:  None. FINDINGS: Mild tricompartmental osteoarthritis. No evidence of fracture  or dislocation. The small amount of joint fluid. IMPRESSION: Mild tricompartmental osteoarthritic change and possible small joint effusion. Electronically Signed   By: Marin Olp M.D.   On: 07/19/2016 14:00    Procedures Procedures (including critical care time)  Medications Ordered in ED Medications - No data to display   Initial Impression / Assessment and Plan / ED Course  I have reviewed the triage vital signs and the nursing notes.  Pertinent labs & imaging results that were available during my care of the patient were reviewed by me and considered in my medical decision making (see chart for details).  Clinical Course    Final Clinical Impressions(s) / ED Diagnoses  I have reviewed the relevant previous healthcare records. I obtained HPI from historian.  ED Course:  Assessment: Pt presents with monoarticular pain, minimal swelling. No erythema. No signs of septic join on exam. Pt is afebrile and stable. Tachycardia likely from pain response. Imaging reviewed, no evidence of occult fracture or injury. CBC/BMP unremarkable. No hx CKD. No Anticoagulation usage. Pt dc with Colchicine. Given  BP meds in ED as well as steroids. Rxed home BP meds. Discussed that pt should respond to treatment with in 24 hour of begining treatment & likely resolve in 2-3 days.   Disposition/Plan:  DC Home Additional Verbal discharge instructions given and discussed with patient.  Pt Instructed to f/u with PCP in the next week for evaluation and treatment of symptoms. Return precautions given Pt acknowledges and agrees with plan  Supervising Physician Duffy Bruce, MD   Final diagnoses:  Acute idiopathic gout of right knee  Hypertension, unspecified type   New Prescriptions New Prescriptions   No medications on file   I personally performed the services described in this documentation, which was scribed in my presence. The recorded information has been reviewed and is accurate.     Shary Decamp, PA-C 07/19/16 1457    Duffy Bruce, MD 07/20/16 Lurline Hare

## 2016-07-19 NOTE — ED Notes (Signed)
Pt transported to xray 

## 2016-07-19 NOTE — ED Notes (Signed)
ED Provider at bedside. 

## 2016-08-27 DIAGNOSIS — I639 Cerebral infarction, unspecified: Secondary | ICD-10-CM

## 2016-08-27 HISTORY — DX: Cerebral infarction, unspecified: I63.9

## 2016-09-15 ENCOUNTER — Emergency Department (HOSPITAL_COMMUNITY): Payer: Medicare Other

## 2016-09-15 ENCOUNTER — Inpatient Hospital Stay (HOSPITAL_COMMUNITY)
Admission: EM | Admit: 2016-09-15 | Discharge: 2016-09-17 | DRG: 065 | Disposition: A | Discharge status: Rehabilitation Facility | Payer: Medicare Other | Attending: Family Medicine | Admitting: Family Medicine

## 2016-09-15 ENCOUNTER — Encounter (HOSPITAL_COMMUNITY): Payer: Self-pay

## 2016-09-15 DIAGNOSIS — G8111 Spastic hemiplegia affecting right dominant side: Secondary | ICD-10-CM

## 2016-09-15 DIAGNOSIS — I639 Cerebral infarction, unspecified: Secondary | ICD-10-CM | POA: Diagnosis present

## 2016-09-15 DIAGNOSIS — I10 Essential (primary) hypertension: Secondary | ICD-10-CM | POA: Diagnosis present

## 2016-09-15 DIAGNOSIS — F819 Developmental disorder of scholastic skills, unspecified: Secondary | ICD-10-CM | POA: Diagnosis present

## 2016-09-15 DIAGNOSIS — E669 Obesity, unspecified: Secondary | ICD-10-CM | POA: Diagnosis present

## 2016-09-15 DIAGNOSIS — M1000 Idiopathic gout, unspecified site: Secondary | ICD-10-CM

## 2016-09-15 DIAGNOSIS — I69392 Facial weakness following cerebral infarction: Secondary | ICD-10-CM

## 2016-09-15 DIAGNOSIS — Z6832 Body mass index (BMI) 32.0-32.9, adult: Secondary | ICD-10-CM | POA: Diagnosis not present

## 2016-09-15 DIAGNOSIS — R7303 Prediabetes: Secondary | ICD-10-CM | POA: Diagnosis present

## 2016-09-15 DIAGNOSIS — I159 Secondary hypertension, unspecified: Secondary | ICD-10-CM | POA: Diagnosis not present

## 2016-09-15 DIAGNOSIS — E538 Deficiency of other specified B group vitamins: Secondary | ICD-10-CM | POA: Diagnosis present

## 2016-09-15 DIAGNOSIS — N182 Chronic kidney disease, stage 2 (mild): Secondary | ICD-10-CM | POA: Diagnosis present

## 2016-09-15 DIAGNOSIS — I6789 Other cerebrovascular disease: Secondary | ICD-10-CM | POA: Diagnosis not present

## 2016-09-15 DIAGNOSIS — E785 Hyperlipidemia, unspecified: Secondary | ICD-10-CM | POA: Diagnosis present

## 2016-09-15 DIAGNOSIS — F1410 Cocaine abuse, uncomplicated: Secondary | ICD-10-CM | POA: Diagnosis not present

## 2016-09-15 DIAGNOSIS — I129 Hypertensive chronic kidney disease with stage 1 through stage 4 chronic kidney disease, or unspecified chronic kidney disease: Secondary | ICD-10-CM | POA: Diagnosis present

## 2016-09-15 DIAGNOSIS — G8191 Hemiplegia, unspecified affecting right dominant side: Secondary | ICD-10-CM | POA: Diagnosis present

## 2016-09-15 DIAGNOSIS — I519 Heart disease, unspecified: Secondary | ICD-10-CM | POA: Diagnosis not present

## 2016-09-15 DIAGNOSIS — M109 Gout, unspecified: Secondary | ICD-10-CM | POA: Diagnosis present

## 2016-09-15 DIAGNOSIS — G8190 Hemiplegia, unspecified affecting unspecified side: Secondary | ICD-10-CM | POA: Diagnosis not present

## 2016-09-15 DIAGNOSIS — I5189 Other ill-defined heart diseases: Secondary | ICD-10-CM

## 2016-09-15 DIAGNOSIS — I69331 Monoplegia of upper limb following cerebral infarction affecting right dominant side: Secondary | ICD-10-CM

## 2016-09-15 LAB — CBC
HCT: 47.9 % (ref 39.0–52.0)
Hemoglobin: 16.1 g/dL (ref 13.0–17.0)
MCH: 30.8 pg (ref 26.0–34.0)
MCHC: 33.6 g/dL (ref 30.0–36.0)
MCV: 91.8 fL (ref 78.0–100.0)
Platelets: 313 10*3/uL (ref 150–400)
RBC: 5.22 MIL/uL (ref 4.22–5.81)
RDW: 15.4 % (ref 11.5–15.5)
WBC: 7.5 10*3/uL (ref 4.0–10.5)

## 2016-09-15 LAB — DIFFERENTIAL
Basophils Absolute: 0 10*3/uL (ref 0.0–0.1)
Basophils Relative: 0 %
Eosinophils Absolute: 0.3 10*3/uL (ref 0.0–0.7)
Eosinophils Relative: 4 %
Lymphocytes Relative: 37 %
Lymphs Abs: 2.7 10*3/uL (ref 0.7–4.0)
Monocytes Absolute: 0.6 10*3/uL (ref 0.1–1.0)
Monocytes Relative: 7 %
Neutro Abs: 3.8 10*3/uL (ref 1.7–7.7)
Neutrophils Relative %: 52 %

## 2016-09-15 LAB — COMPREHENSIVE METABOLIC PANEL
ALT: 21 U/L (ref 17–63)
AST: 37 U/L (ref 15–41)
Albumin: 4.5 g/dL (ref 3.5–5.0)
Alkaline Phosphatase: 85 U/L (ref 38–126)
Anion gap: 14 (ref 5–15)
BUN: 16 mg/dL (ref 6–20)
CO2: 20 mmol/L — ABNORMAL LOW (ref 22–32)
Calcium: 9.5 mg/dL (ref 8.9–10.3)
Chloride: 102 mmol/L (ref 101–111)
Creatinine, Ser: 1.27 mg/dL — ABNORMAL HIGH (ref 0.61–1.24)
GFR calc Af Amer: >60 mL/min (ref >60)
GFR calc non Af Amer: >60 mL/min (ref >60)
Glucose, Bld: 107 mg/dL — ABNORMAL HIGH (ref 65–99)
Potassium: 3.7 mmol/L (ref 3.5–5.1)
Sodium: 136 mmol/L (ref 135–145)
Total Bilirubin: 0.5 mg/dL (ref 0.3–1.2)
Total Protein: 8 g/dL (ref 6.5–8.1)

## 2016-09-15 LAB — I-STAT CHEM 8, ED
BUN: 20 mg/dL (ref 6–20)
Calcium, Ion: 1.15 mmol/L (ref 1.15–1.40)
Chloride: 104 mmol/L (ref 101–111)
Creatinine, Ser: 1.3 mg/dL — ABNORMAL HIGH (ref 0.61–1.24)
Glucose, Bld: 108 mg/dL — ABNORMAL HIGH (ref 65–99)
HCT: 51 % (ref 39.0–52.0)
Hemoglobin: 17.3 g/dL — ABNORMAL HIGH (ref 13.0–17.0)
Potassium: 3.9 mmol/L (ref 3.5–5.1)
Sodium: 141 mmol/L (ref 135–145)
TCO2: 26 mmol/L (ref 0–100)

## 2016-09-15 LAB — I-STAT TROPONIN, ED: Troponin i, poc: 0.01 ng/mL (ref 0.00–0.08)

## 2016-09-15 LAB — APTT: aPTT: 33 seconds (ref 24–36)

## 2016-09-15 LAB — PROTIME-INR
INR: 0.89
Prothrombin Time: 12 seconds (ref 11.4–15.2)

## 2016-09-15 MED ORDER — SENNOSIDES-DOCUSATE SODIUM 8.6-50 MG PO TABS: 1.0000 | ORAL_TABLET | Freq: Every evening | ORAL | Status: DC | PRN

## 2016-09-15 MED ORDER — IOPAMIDOL (ISOVUE-370) INJECTION 76%
INTRAVENOUS | Status: AC
  Administered 2016-09-15: 50 mL
  Filled 2016-09-15: qty 50

## 2016-09-15 MED ORDER — STROKE: EARLY STAGES OF RECOVERY BOOK
Freq: Once | Status: AC
  Administered 2016-09-16: 02:00:00
  Filled 2016-09-15: qty 1

## 2016-09-15 MED ORDER — ACETAMINOPHEN 325 MG PO TABS
650.0000 mg | ORAL_TABLET | ORAL | Status: DC | PRN
  Administered 2016-09-16: 650 mg via ORAL
  Filled 2016-09-15: qty 2

## 2016-09-15 MED ORDER — SODIUM CHLORIDE 0.9 % IV SOLN
INTRAVENOUS | Status: AC
  Administered 2016-09-16: 02:00:00 via INTRAVENOUS

## 2016-09-15 MED ORDER — ENOXAPARIN SODIUM 40 MG/0.4ML ~~LOC~~ SOLN
40.0000 mg | Freq: Every day | SUBCUTANEOUS | Status: DC
  Administered 2016-09-16 – 2016-09-17 (×2): 40 mg via SUBCUTANEOUS
  Filled 2016-09-15 (×2): qty 0.4

## 2016-09-15 MED ORDER — ACETAMINOPHEN 650 MG RE SUPP: 650.0000 mg | RECTAL | Status: DC | PRN

## 2016-09-15 MED ORDER — ASPIRIN 325 MG PO TABS
325.0000 mg | ORAL_TABLET | Freq: Every day | ORAL | Status: DC
  Administered 2016-09-16 – 2016-09-17 (×2): 325 mg via ORAL
  Filled 2016-09-15 (×2): qty 1

## 2016-09-15 MED ORDER — ALLOPURINOL 100 MG PO TABS
100.0000 mg | ORAL_TABLET | Freq: Every day | ORAL | Status: DC
  Administered 2016-09-16 – 2016-09-17 (×2): 100 mg via ORAL
  Filled 2016-09-15 (×2): qty 1

## 2016-09-15 MED ORDER — LABETALOL HCL 5 MG/ML IV SOLN
5.0000 mg | INTRAVENOUS | Status: DC | PRN
  Administered 2016-09-15: 5 mg via INTRAVENOUS
  Administered 2016-09-16 (×2): 10 mg via INTRAVENOUS
  Administered 2016-09-16: 5 mg via INTRAVENOUS
  Administered 2016-09-17: 10 mg via INTRAVENOUS
  Filled 2016-09-15 (×5): qty 4

## 2016-09-15 MED ORDER — CLOPIDOGREL BISULFATE 300 MG PO TABS: 300.0000 mg | ORAL_TABLET | Freq: Once | ORAL | Status: DC

## 2016-09-15 MED ORDER — CLOPIDOGREL BISULFATE 300 MG PO TABS
300.0000 mg | ORAL_TABLET | ORAL | Status: AC
  Administered 2016-09-15: 300 mg via ORAL
  Filled 2016-09-15: qty 1

## 2016-09-15 MED ORDER — ASPIRIN 325 MG PO TABS
325.0000 mg | ORAL_TABLET | ORAL | Status: AC
  Administered 2016-09-15: 325 mg via ORAL
  Filled 2016-09-15: qty 1

## 2016-09-15 MED ORDER — ACETAMINOPHEN 160 MG/5ML PO SOLN: 650.0000 mg | ORAL | Status: DC | PRN

## 2016-09-15 MED ORDER — ASPIRIN 325 MG PO TABS: 325.0000 mg | ORAL_TABLET | Freq: Once | ORAL | Status: DC

## 2016-09-15 MED ORDER — ASPIRIN 300 MG RE SUPP: 300.0000 mg | Freq: Every day | RECTAL | Status: DC

## 2016-09-15 NOTE — H&P (Signed)
History and Physical    Randall Mccall [id number] DOB: 11/07/1970 DOA: 09/15/2016  PCP: Campbell Lerner, MD   Patient coming from: Home  Chief Complaint: Right face, arm, and leg weakness  HPI: Randall Mccall is a 46 y.o. male with medical history significant for hypertension and gout, now presenting to the emergency department with weakness involving the right face, arm, and leg. Patient reports that he woke in his usual state of health today and was having an uneventful day until approximately 1 PM, when he noted weakness involving the right side of his face, right arm, and right leg. He never experienced similar symptoms previously and decided to lay down for a while, hoping the symptoms would resolve on their own. Symptoms persisted unchanged throughout the day, eventually prompting his presentation to the ED. The patient denies any recent fall or trauma and denies any headache, change in vision or hearing, or confusion. He denies any recent fevers, chills, cough, chest pain, or palpitations. He reports a history of hypertension, but notes that he has not followed up with his primary care physician in some time and has been out of his antihypertensive medications.  ED Course: Upon arrival to the ED, patient is found to be afebrile, saturating well on room air, hypertensive to 190/130, and with vitals otherwise stable. EKG features sinus rhythm with first degree AV nodal block and nonspecific ST-T changes in anteroseptal leads. Chemistry panels notable for a serum creatinine 1.27 which appears to be slightly increased from his baseline, and CBC is unremarkable. INR is within normal limits and troponin is negative at 0.01. Noncontrast head CT was obtained and notable for an acute to subacute left lenticuostriate lacunar infarct, as well as findings that suggest chronic small vessel ischemic disease which appears advanced for age. Neurology evaluated the patient and emergency department and advised a  medical admission for ongoing evaluation and management of acute stroke. Patient was treated with full dose aspirin and a loading dose of 300 mg Plavix in the emergency department. He remained hypertensive, but otherwise stable, and will be admitted to the telemetry unit for ongoing evaluation and management of acute lacunar infarct.  Review of Systems:  All other systems reviewed and apart from HPI, are negative.  Past Medical History:  Diagnosis Date  . Gout   . Hypertension   . Muscle weakness     Past Surgical History:  Procedure Laterality Date  . KNEE SURGERY       reports that he has never smoked. He has never used smokeless tobacco. He reports that he drinks about 0.6 oz of alcohol per week . He reports that he does not use drugs.  No Known Allergies  History reviewed. No pertinent family history.   Prior to Admission medications   Medication Sig Start Date End Date Taking? Authorizing Provider  allopurinol (ZYLOPRIM) 100 MG tablet Take 100 mg by mouth daily.   Yes Historical Provider, MD  ibuprofen (ADVIL,MOTRIN) 200 MG tablet Take 400 mg by mouth every 6 (six) hours as needed for headache, mild pain or moderate pain.   Yes Historical Provider, MD  amLODipine (NORVASC) 10 MG tablet Take 1 tablet (10 mg total) by mouth daily. Patient not taking: Reported on 09/15/2016 06/15/16   Elnora Morrison, MD  amLODipine (NORVASC) 10 MG tablet Take 1 tablet (10 mg total) by mouth daily. 07/19/16   Shary Decamp, PA-C  cloNIDine (CATAPRES) 0.1 MG tablet Take 1 tablet (0.1 mg total) by mouth 2 (two)  times daily. Patient not taking: Reported on 09/15/2016 06/15/16   Elnora Morrison, MD  cloNIDine (CATAPRES) 0.1 MG tablet Take 1 tablet (0.1 mg total) by mouth 2 (two) times daily. 07/19/16   Shary Decamp, PA-C  colchicine 0.6 MG tablet Take 1 tablet (0.6 mg total) by mouth daily. Until symptoms resolve Patient not taking: Reported on 09/15/2016 07/19/16   Shary Decamp, PA-C  oxyCODONE-acetaminophen  (PERCOCET/ROXICET) 5-325 MG tablet Take 1 tablet by mouth every 8 (eight) hours as needed for severe pain. Patient not taking: Reported on 09/15/2016 07/19/16   Shary Decamp, PA-C    Physical Exam: Vitals:   09/15/16 2051 09/15/16 2107 09/15/16 2130 09/15/16 2144  BP:  (!) 166/119 (!) 158/112   Pulse:  88 82   Resp:  16 17   Temp: 98.2 F (36.8 C)   98.4 F (36.9 C)  TempSrc: Oral     SpO2:  97% 94%       Constitutional: NAD, calm, comfortable Eyes: PERTLA, lids and conjunctivae normal ENMT: Mucous membranes are moist. Posterior pharynx clear of any exudate or lesions.   Neck: normal, supple, no masses, no thyromegaly Respiratory: clear to auscultation bilaterally, no crackles, occasional expiratory wheeze. Normal respiratory effort.   Cardiovascular: S1 & S2 heard, regular rate and rhythm. No extremity edema. No significant JVD. Abdomen: No distension, no tenderness, no masses palpated. Bowel sounds normal.  Musculoskeletal: no clubbing / cyanosis. No joint deformity upper and lower extremities. Normal muscle tone.  Skin: no significant rashes, lesions, ulcers. Warm, dry, well-perfused. Neurologic: Right lower facial weakness, CN II-XII intact otherwise. Sensation intact throughout. Grip strength 5/5 on left, 4/5 on right. Plantar- and dorsiflexion strength 5/5 on left, and 4/5 on right.  Psychiatric: Normal judgment and insight. Alert and oriented x 3. Normal mood and affect.     Labs on Admission: I have personally reviewed following labs and imaging studies  CBC:  Recent Labs Lab 09/15/16 2028 09/15/16 2039  WBC 7.5  --   NEUTROABS 3.8  --   HGB 16.1 17.3*  HCT 47.9 51.0  MCV 91.8  --   PLT 313  --    Basic Metabolic Panel:  Recent Labs Lab 09/15/16 2028 09/15/16 2039  NA 136 141  K 3.7 3.9  CL 102 104  CO2 20*  --   GLUCOSE 107* 108*  BUN 16 20  CREATININE 1.27* 1.30*  CALCIUM 9.5  --    GFR: CrCl cannot be calculated (Unknown ideal weight.). Liver  Function Tests:  Recent Labs Lab 09/15/16 2028  AST 37  ALT 21  ALKPHOS 85  BILITOT 0.5  PROT 8.0  ALBUMIN 4.5   No results for input(s): LIPASE, AMYLASE in the last 168 hours. No results for input(s): AMMONIA in the last 168 hours. Coagulation Profile:  Recent Labs Lab 09/15/16 2028  INR 0.89   Cardiac Enzymes: No results for input(s): CKTOTAL, CKMB, CKMBINDEX, TROPONINI in the last 168 hours. BNP (last 3 results) No results for input(s): PROBNP in the last 8760 hours. HbA1C: No results for input(s): HGBA1C in the last 72 hours. CBG: No results for input(s): GLUCAP in the last 168 hours. Lipid Profile: No results for input(s): CHOL, HDL, LDLCALC, TRIG, CHOLHDL, LDLDIRECT in the last 72 hours. Thyroid Function Tests: No results for input(s): TSH, T4TOTAL, FREET4, T3FREE, THYROIDAB in the last 72 hours. Anemia Panel: No results for input(s): VITAMINB12, FOLATE, FERRITIN, TIBC, IRON, RETICCTPCT in the last 72 hours. Urine analysis: No results found for: COLORURINE,  APPEARANCEUR, LABSPEC, PHURINE, GLUCOSEU, HGBUR, BILIRUBINUR, KETONESUR, PROTEINUR, UROBILINOGEN, NITRITE, LEUKOCYTESUR Sepsis Labs: @LABRCNTIP (procalcitonin:4,lacticidven:4) )No results found for this or any previous visit (from the past 240 hour(s)).   Radiological Exams on Admission: Ct Angio Head W Or Wo Contrast  Result Date: 09/15/2016 CLINICAL DATA:  Right facial droop, right arm weakness, and slurred speech. EXAM: CT ANGIOGRAPHY HEAD AND NECK TECHNIQUE: Multidetector CT imaging of the head and neck was performed using the standard protocol during bolus administration of intravenous contrast. Multiplanar CT image reconstructions and MIPs were obtained to evaluate the vascular anatomy. Carotid stenosis measurements (when applicable) are obtained utilizing NASCET criteria, using the distal internal carotid diameter as the denominator. CONTRAST:  50 mL Isovue 370 COMPARISON:  None. FINDINGS: CTA NECK FINDINGS  Aortic arch: 3 vessel aortic arch. Widely patent brachiocephalic and subclavian arteries. Right carotid system: Patent without evidence of stenosis or dissection. Minimal soft plaque at the carotid bifurcation. Left carotid system: Patent without evidence of stenosis or dissection. Mild, predominantly soft plaque at the carotid bifurcation. Vertebral arteries: Patent without evidence of stenosis or dissection. Left vertebral artery is strongly dominant. Skeleton: Mild cervical spondylosis. Other neck: No mass or lymph node enlargement. Upper chest: Mild motion artifact through the lung apices with dependent subsegmental atelectasis. Review of the MIP images confirms the above findings CTA HEAD FINDINGS Anterior circulation: The internal carotid arteries are patent from skullbase to carotid termini with bilateral siphon atherosclerosis. There is moderate right anterior cavernous segment stenosis. The MCAs are patent with an early bifurcation noted on the left. There is nonocclusive thrombus versus atherosclerotic narrowing involving the left MCA bifurcation, predominantly the superior M2 division which is severely narrowed and thready in appearance over a 6 mm length. There is moderate stenosis of the proximal M2 inferior division. The right MCA and both ACAs are patent without evidence of major branch occlusion or significant proximal stenosis. Mild diffuse branch vessel irregularity is present. The left A1 segment is dominant. No aneurysm. Posterior circulation: Intracranial left vertebral artery is widely patent and supplies the basilar. The right vertebral artery ends in PICA. Left PICA is also patent. The basilar artery is patent and tortuous with mild proximal and mid segment stenoses. SCA origins are patent. There is a patent left posterior communicating artery with hypoplastic left P1 segment. No significant PCA stenosis is identified. No aneurysm. Venous sinuses: Grossly patent within limitations of arterial  phase dominant contrast timing. Anatomic variants: None of significance. Review of the MIP images confirms the above findings IMPRESSION: 1. High-grade atherosclerotic stenosis versus nonocclusive thrombus at the left MCA bifurcation, predominantly involving the M2 superior division. 2. Moderate right cavernous ICA stenosis. 3. No cervical carotid artery or vertebral artery stenosis. These results were called by telephone at the time of interpretation on 09/15/2016 at 9:35 pm to Dr. Roland Rack , who verbally acknowledged these results. Electronically Signed   By: Logan Bores M.D.   On: 09/15/2016 21:52   Ct Angio Neck W Or Wo Contrast  Result Date: 09/15/2016 CLINICAL DATA:  Right facial droop, right arm weakness, and slurred speech. EXAM: CT ANGIOGRAPHY HEAD AND NECK TECHNIQUE: Multidetector CT imaging of the head and neck was performed using the standard protocol during bolus administration of intravenous contrast. Multiplanar CT image reconstructions and MIPs were obtained to evaluate the vascular anatomy. Carotid stenosis measurements (when applicable) are obtained utilizing NASCET criteria, using the distal internal carotid diameter as the denominator. CONTRAST:  50 mL Isovue 370 COMPARISON:  None. FINDINGS: CTA NECK  FINDINGS Aortic arch: 3 vessel aortic arch. Widely patent brachiocephalic and subclavian arteries. Right carotid system: Patent without evidence of stenosis or dissection. Minimal soft plaque at the carotid bifurcation. Left carotid system: Patent without evidence of stenosis or dissection. Mild, predominantly soft plaque at the carotid bifurcation. Vertebral arteries: Patent without evidence of stenosis or dissection. Left vertebral artery is strongly dominant. Skeleton: Mild cervical spondylosis. Other neck: No mass or lymph node enlargement. Upper chest: Mild motion artifact through the lung apices with dependent subsegmental atelectasis. Review of the MIP images confirms the above  findings CTA HEAD FINDINGS Anterior circulation: The internal carotid arteries are patent from skullbase to carotid termini with bilateral siphon atherosclerosis. There is moderate right anterior cavernous segment stenosis. The MCAs are patent with an early bifurcation noted on the left. There is nonocclusive thrombus versus atherosclerotic narrowing involving the left MCA bifurcation, predominantly the superior M2 division which is severely narrowed and thready in appearance over a 6 mm length. There is moderate stenosis of the proximal M2 inferior division. The right MCA and both ACAs are patent without evidence of major branch occlusion or significant proximal stenosis. Mild diffuse branch vessel irregularity is present. The left A1 segment is dominant. No aneurysm. Posterior circulation: Intracranial left vertebral artery is widely patent and supplies the basilar. The right vertebral artery ends in PICA. Left PICA is also patent. The basilar artery is patent and tortuous with mild proximal and mid segment stenoses. SCA origins are patent. There is a patent left posterior communicating artery with hypoplastic left P1 segment. No significant PCA stenosis is identified. No aneurysm. Venous sinuses: Grossly patent within limitations of arterial phase dominant contrast timing. Anatomic variants: None of significance. Review of the MIP images confirms the above findings IMPRESSION: 1. High-grade atherosclerotic stenosis versus nonocclusive thrombus at the left MCA bifurcation, predominantly involving the M2 superior division. 2. Moderate right cavernous ICA stenosis. 3. No cervical carotid artery or vertebral artery stenosis. These results were called by telephone at the time of interpretation on 09/15/2016 at 9:35 pm to Dr. Roland Rack , who verbally acknowledged these results. Electronically Signed   By: Logan Bores M.D.   On: 09/15/2016 21:52   Ct Head Code Stroke W/o Cm  Result Date:  09/15/2016 CLINICAL DATA:  Code stroke. RIGHT facial droop. History of hypertension. EXAM: CT HEAD WITHOUT CONTRAST TECHNIQUE: Contiguous axial images were obtained from the base of the skull through the vertex without intravenous contrast. COMPARISON:  None. FINDINGS: BRAIN: LEFT caudate and to lesser extent putaminal low-density infarct, without mass effect nor ex vacuo dilatation subjacent ventricle. Ventricles and sulci are overall normal for patient's age. No intraparenchymal hemorrhage, mass effect, midline shift or acute large vascular territory infarct. Patchy white matter supratentorial hypodensities. No abnormal extra-axial fluid collections. Basal cisterns are patent. VASCULAR: Mild calcific atherosclerosis of the carotid siphons. SKULL/SOFT TISSUES: No skull fracture. No significant soft tissue swelling. ORBITS/SINUSES: The included ocular globes and orbital contents are normal.The mastoid aircells and included paranasal sinuses are well-aerated. OTHER: None. ASPECTS [facility] Stroke Program Early CT Score) - Ganglionic level infarction (caudate, lentiform nuclei, internal capsule, insula, M1-M3 cortex): 6 - Supraganglionic infarction (M4-M6 cortex): 3 Total score (0-10 with 10 being normal): 10 IMPRESSION: 1. Acute to subacute appearing LEFT lenticulostriate lacunar infarct. Mild atherosclerosis. Mild to moderate white matter changes most consistent with chronic small vessel ischemic disease, advanced for age. 2. ASPECTS is 9. Critical Value/emergent results were called by telephone at the time of interpretation on 09/15/2016  at 8:45 pm to Dr. Leonel Ramsay, Neurology, who verbally acknowledged these results. Electronically Signed   By: Elon Alas M.D.   On: 09/15/2016 20:48    EKG: Independently reviewed. Sinus rhythm, first degree AV block, non-specific ST-T abnormalities in anteroseptal leads  Assessment/Plan  1. Acute lacunar stroke  - Pt reports acute onset of right facial droop with  weakness in right arm and leg at ~13:00 on 09/15/16  - Neurology is consulting and much appreciated; tPA not given due to pt presenting outside the window  - Head CT positive for acute-subacute left lenticulostriate lacunar infarct and age-advanced small vessel disease  - CTA head and neck with atherosclerotic disease   - Monitor on telemetry with frequent neuro checks, PT, OT, SLP evals  - Obtain MRI brain, TTE, fasting lipid panel, A1c  - Plavix and ASA for now per neuro recs  - Permissive HTN in acute-phase   2. Hypertension  - BP 200/120 range on presentation, likely secondary to acute ischemic CVA  - Previously taking clonidine and Norvasc at home, but was lost to PCP f/u and ran out  - Plan to permit HTN to [id number] in acute phase; prn labetalol IVP's available for pressures beyond this     DVT prophylaxis: sq Lovenox  Code Status: Full  Family Communication: Discussed with patient Disposition Plan: Admit to telemetry Consults called: Neurology Admission status: Inpatient    Vianne Bulls, MD [facility] Pager [phone]  If 7PM-7AM, please contact night-coverage [email or web address] Password [email or web address]  09/15/2016, 11:29 PM

## 2016-09-15 NOTE — ED Notes (Signed)
ED Provider at bedside. 

## 2016-09-15 NOTE — Consult Note (Signed)
Neurology Consultation Reason for Consult: Stroke Referring Physician: Hillard Danker  CC: Stroke  History is obtained from: Patient  HPI: Randall Mccall is a 46 y.o. male who was in his normal state until around 1 PM at which point he began having right-sided weakness. He decided to lay down and see if things would get better, but when he awoke it had not therefore he presented to the emergency room.   LKW: 1 PM tpa given?: no, outside of window   ROS: A 14 point ROS was performed and is negative except as noted in the HPI.   Past Medical History:  Diagnosis Date  . Gout   . Hypertension   . Muscle weakness      Family history: No history of stroke   Social History:  reports that he has never smoked. He has never used smokeless tobacco. He reports that he drinks about 0.6 oz of alcohol per week . He reports that he does not use drugs.   Exam: Current vital signs: BP (!) 158/112   Pulse 82   Temp 98.4 F (36.9 C)   Resp 17   SpO2 94%  Vital signs in last 24 hours: Temp:  [98.2 F (36.8 C)-98.4 F (36.9 C)] 98.4 F (36.9 C) (01/20 2144) Pulse Rate:  [82-92] 82 (01/20 2130) Resp:  [16-18] 17 (01/20 2130) BP: (158-193)/(112-128) 158/112 (01/20 2130) SpO2:  [94 %-98 %] 94 % (01/20 2130)   Physical Exam  Constitutional: Appears well-developed and well-nourished.  Psych: Affect appropriate to situation Eyes: No scleral injection HENT: No OP obstrucion Head: Normocephalic.  Cardiovascular: Normal rate and regular rhythm.  Respiratory: Effort normal and breath sounds normal to anterior ascultation GI: Soft.  No distension. There is no tenderness.  Skin: WDI  Neuro: Mental Status: Patient is awake, alert, oriented to person, place, month, year, and situation. Patient is able to give a clear and coherent history. No signs of aphasia or neglect Cranial Nerves: II: Visual Fields are full. Pupils are equal, round, and reactive to light.   III,IV, VI: EOMI without  ptosis or diploplia.  V: Facial sensation is symmetric to temperature VII: Facial movement is decreased on the right VIII: hearing is intact to voice X: Uvula elevates symmetrically XI: Shoulder shrug is symmetric. XII: tongue is midline without atrophy or fasciculations.  Motor: Tone is normal. Bulk is normal. 5/5 strength was present in the left side, on the right he has 4/5 weakness of the arm and leg. Sensory: Sensation is symmetric to light touch and temperature in the arms and legs. Cerebellar: He has ataxia that seems out of proportion to weakness in the right arm.     I have reviewed labs in epic and the results pertinent to this consultation are: Borderline creatinine  I have reviewed the images obtained: CT head-lacunar infarct in the basal ganglia, CTA head-severe atherosclerotic disease in the MCA  Impression: 46 year old male with basal ganglia infarct. Given the disease seen on CTA, I would favor dual antiplatelet therapy temporarily, followed by monotherapy.  Recommendations: 1. HgbA1c, fasting lipid panel 2. MRI, MRA  of the brain without contrast 3. Frequent neuro checks 4. Echocardiogram 5. Carotid dopplers 6. Prophylactic therapy-Antiplatelet med: Aspirin - dose 325mg  PO or 300mg  PR and Plavix 75 mg daily, will give one time load of 300 mg 7. Risk factor modification 8. Telemetry monitoring 9. PT consult, OT consult, Speech consult 10. please page stroke NP  Or  PA  Or MD  from  8am -4 pm starting 1/21 as this patient will be followed by the stroke team at this point.   You can look them up on [email or web address]      Roland Rack, MD [facility] [phone]  If 7pm- 7am, please page neurology on call as listed in [place].

## 2016-09-15 NOTE — ED Provider Notes (Signed)
[place] DEPT Provider Note   CSN: [id number] Arrival date & time: 09/15/16  2024     History   Chief Complaint Chief Complaint  Patient presents with  . Code Stroke    HPI Randall Mccall is a 46 y.o. male.  HPI Patient presents to the emergency room for evaluation of right sided weakness associated with slurred speech and facial droop. Patient felt fine when he woke up this morning. He walked to the bathroom a few times during the morning and did not notice any issues. The first time he noticed any symptoms was approximately 1 PM or 1:30 PM. He went to the bathroom and felt weak on his right side. He saw in the near that his face was drooping. He also felt that his speech was somewhat slurred. So felt a little bit off balance. Patient came in to the emergency room this evening to be evaluated because his symptoms have persisted. He denies any history of stroke. He does have a history of hypertension. Past Medical History:  Diagnosis Date  . Gout   . Hypertension   . Muscle weakness     Patient Active Problem List   Diagnosis Date Noted  . Acute ischemic stroke ([place]) 09/15/2016  . Stroke (cerebrum) ([place]) 09/15/2016  . Vitamin B12 deficiency 08/10/2015  . Weakness 07/02/2013  . Abnormal CPK 07/02/2013    Past Surgical History:  Procedure Laterality Date  . KNEE SURGERY         Home Medications    Prior to Admission medications   Medication Sig Start Date End Date Taking? Authorizing Provider  allopurinol (ZYLOPRIM) 100 MG tablet Take 100 mg by mouth daily.   Yes Historical Provider, MD  ibuprofen (ADVIL,MOTRIN) 200 MG tablet Take 400 mg by mouth every 6 (six) hours as needed for headache, mild pain or moderate pain.   Yes Historical Provider, MD  amLODipine (NORVASC) 10 MG tablet Take 1 tablet (10 mg total) by mouth daily. Patient not taking: Reported on 09/15/2016 06/15/16   Elnora Morrison, MD  amLODipine (NORVASC) 10 MG tablet Take 1 tablet (10 mg total) by  mouth daily. 07/19/16   Shary Decamp, PA-C  cloNIDine (CATAPRES) 0.1 MG tablet Take 1 tablet (0.1 mg total) by mouth 2 (two) times daily. Patient not taking: Reported on 09/15/2016 06/15/16   Elnora Morrison, MD  cloNIDine (CATAPRES) 0.1 MG tablet Take 1 tablet (0.1 mg total) by mouth 2 (two) times daily. 07/19/16   Shary Decamp, PA-C  colchicine 0.6 MG tablet Take 1 tablet (0.6 mg total) by mouth daily. Until symptoms resolve Patient not taking: Reported on 09/15/2016 07/19/16   Shary Decamp, PA-C  oxyCODONE-acetaminophen (PERCOCET/ROXICET) 5-325 MG tablet Take 1 tablet by mouth every 8 (eight) hours as needed for severe pain. Patient not taking: Reported on 09/15/2016 07/19/16   Shary Decamp, PA-C    Family History History reviewed. No pertinent family history.  Social History Social History  Substance Use Topics  . Smoking status: Never Smoker  . Smokeless tobacco: Never Used  . Alcohol use 0.6 oz/week    1 Cans of beer per week     Allergies   Patient has no known allergies.   Review of Systems Review of Systems  All other systems reviewed and are negative.    Physical Exam Updated Vital Signs BP (!) 158/112   Pulse 82   Temp 98.4 F (36.9 C)   Resp 17   SpO2 94%   Physical Exam  Constitutional: He  is oriented to person, place, and time. He appears well-developed and well-nourished. No distress.  HENT:  Head: Normocephalic and atraumatic.  Right Ear: External ear normal.  Left Ear: External ear normal.  Mouth/Throat: Oropharynx is clear and moist.  Eyes: Conjunctivae are normal. Right eye exhibits no discharge. Left eye exhibits no discharge. No scleral icterus.  Neck: Neck supple. No tracheal deviation present.  Cardiovascular: Normal rate, regular rhythm and intact distal pulses.   Pulmonary/Chest: Effort normal and breath sounds normal. No stridor. No respiratory distress. He has no wheezes. He has no rales.  Abdominal: Soft. Bowel sounds are normal. He exhibits no  distension. There is no tenderness. There is no rebound and no guarding.  Musculoskeletal: He exhibits no edema or tenderness.  Neurological: He is alert and oriented to person, place, and time. He has normal strength. No cranial nerve deficit (Right facial droop, extraocular movements intact, mild slurred speech ) or sensory deficit. He exhibits normal muscle tone. He displays no seizure activity. Coordination normal.  Pronator drift right upper extremity, unable to hold right leg off bed for 5 seconds, sensation intact in all extremities, no visual field cuts, no left or right sided neglect,, no nystagmus noted   Skin: Skin is warm and dry. No rash noted. He is not diaphoretic.  Psychiatric: He has a normal mood and affect.  Nursing note and vitals reviewed.    ED Treatments / Results  Labs (all labs ordered are listed, but only abnormal results are displayed) Labs Reviewed  COMPREHENSIVE METABOLIC PANEL - Abnormal; Notable for the following:       Result Value   CO2 20 (*)    Glucose, Bld 107 (*)    Creatinine, Ser 1.27 (*)    All other components within normal limits  I-STAT CHEM 8, ED - Abnormal; Notable for the following:    Creatinine, Ser 1.30 (*)    Glucose, Bld 108 (*)    Hemoglobin 17.3 (*)    All other components within normal limits  PROTIME-INR  APTT  CBC  DIFFERENTIAL  I-STAT TROPOININ, ED  CBG MONITORING, ED    EKG  EKG Interpretation  Date/Time:  Saturday September 15 2016 20:44:07 EST Ventricular Rate:  94 PR Interval:    QRS Duration: 93 QT Interval:  353 QTC Calculation: 442 R Axis:   44 Text Interpretation:  Sinus rhythm Prolonged PR interval Probable anteroseptal infarct, recent Baseline wander in lead(s) II anteroseptal changes increased since last tracing Confirmed by KNAPP  MD-J, JON [id number]) on 09/15/2016 8:47:28 PM       Radiology Ct Angio Head W Or Wo Contrast  Result Date: 09/15/2016 CLINICAL DATA:  Right facial droop, right arm weakness,  and slurred speech. EXAM: CT ANGIOGRAPHY HEAD AND NECK TECHNIQUE: Multidetector CT imaging of the head and neck was performed using the standard protocol during bolus administration of intravenous contrast. Multiplanar CT image reconstructions and MIPs were obtained to evaluate the vascular anatomy. Carotid stenosis measurements (when applicable) are obtained utilizing NASCET criteria, using the distal internal carotid diameter as the denominator. CONTRAST:  50 mL Isovue 370 COMPARISON:  None. FINDINGS: CTA NECK FINDINGS Aortic arch: 3 vessel aortic arch. Widely patent brachiocephalic and subclavian arteries. Right carotid system: Patent without evidence of stenosis or dissection. Minimal soft plaque at the carotid bifurcation. Left carotid system: Patent without evidence of stenosis or dissection. Mild, predominantly soft plaque at the carotid bifurcation. Vertebral arteries: Patent without evidence of stenosis or dissection. Left vertebral artery is strongly  dominant. Skeleton: Mild cervical spondylosis. Other neck: No mass or lymph node enlargement. Upper chest: Mild motion artifact through the lung apices with dependent subsegmental atelectasis. Review of the MIP images confirms the above findings CTA HEAD FINDINGS Anterior circulation: The internal carotid arteries are patent from skullbase to carotid termini with bilateral siphon atherosclerosis. There is moderate right anterior cavernous segment stenosis. The MCAs are patent with an early bifurcation noted on the left. There is nonocclusive thrombus versus atherosclerotic narrowing involving the left MCA bifurcation, predominantly the superior M2 division which is severely narrowed and thready in appearance over a 6 mm length. There is moderate stenosis of the proximal M2 inferior division. The right MCA and both ACAs are patent without evidence of major branch occlusion or significant proximal stenosis. Mild diffuse branch vessel irregularity is present. The  left A1 segment is dominant. No aneurysm. Posterior circulation: Intracranial left vertebral artery is widely patent and supplies the basilar. The right vertebral artery ends in PICA. Left PICA is also patent. The basilar artery is patent and tortuous with mild proximal and mid segment stenoses. SCA origins are patent. There is a patent left posterior communicating artery with hypoplastic left P1 segment. No significant PCA stenosis is identified. No aneurysm. Venous sinuses: Grossly patent within limitations of arterial phase dominant contrast timing. Anatomic variants: None of significance. Review of the MIP images confirms the above findings IMPRESSION: 1. High-grade atherosclerotic stenosis versus nonocclusive thrombus at the left MCA bifurcation, predominantly involving the M2 superior division. 2. Moderate right cavernous ICA stenosis. 3. No cervical carotid artery or vertebral artery stenosis. These results were called by telephone at the time of interpretation on 09/15/2016 at 9:35 pm to Dr. Roland Rack , who verbally acknowledged these results. Electronically Signed   By: Logan Bores M.D.   On: 09/15/2016 21:52   Ct Angio Neck W Or Wo Contrast  Result Date: 09/15/2016 CLINICAL DATA:  Right facial droop, right arm weakness, and slurred speech. EXAM: CT ANGIOGRAPHY HEAD AND NECK TECHNIQUE: Multidetector CT imaging of the head and neck was performed using the standard protocol during bolus administration of intravenous contrast. Multiplanar CT image reconstructions and MIPs were obtained to evaluate the vascular anatomy. Carotid stenosis measurements (when applicable) are obtained utilizing NASCET criteria, using the distal internal carotid diameter as the denominator. CONTRAST:  50 mL Isovue 370 COMPARISON:  None. FINDINGS: CTA NECK FINDINGS Aortic arch: 3 vessel aortic arch. Widely patent brachiocephalic and subclavian arteries. Right carotid system: Patent without evidence of stenosis or  dissection. Minimal soft plaque at the carotid bifurcation. Left carotid system: Patent without evidence of stenosis or dissection. Mild, predominantly soft plaque at the carotid bifurcation. Vertebral arteries: Patent without evidence of stenosis or dissection. Left vertebral artery is strongly dominant. Skeleton: Mild cervical spondylosis. Other neck: No mass or lymph node enlargement. Upper chest: Mild motion artifact through the lung apices with dependent subsegmental atelectasis. Review of the MIP images confirms the above findings CTA HEAD FINDINGS Anterior circulation: The internal carotid arteries are patent from skullbase to carotid termini with bilateral siphon atherosclerosis. There is moderate right anterior cavernous segment stenosis. The MCAs are patent with an early bifurcation noted on the left. There is nonocclusive thrombus versus atherosclerotic narrowing involving the left MCA bifurcation, predominantly the superior M2 division which is severely narrowed and thready in appearance over a 6 mm length. There is moderate stenosis of the proximal M2 inferior division. The right MCA and both ACAs are patent without evidence of major branch  occlusion or significant proximal stenosis. Mild diffuse branch vessel irregularity is present. The left A1 segment is dominant. No aneurysm. Posterior circulation: Intracranial left vertebral artery is widely patent and supplies the basilar. The right vertebral artery ends in PICA. Left PICA is also patent. The basilar artery is patent and tortuous with mild proximal and mid segment stenoses. SCA origins are patent. There is a patent left posterior communicating artery with hypoplastic left P1 segment. No significant PCA stenosis is identified. No aneurysm. Venous sinuses: Grossly patent within limitations of arterial phase dominant contrast timing. Anatomic variants: None of significance. Review of the MIP images confirms the above findings IMPRESSION: 1.  High-grade atherosclerotic stenosis versus nonocclusive thrombus at the left MCA bifurcation, predominantly involving the M2 superior division. 2. Moderate right cavernous ICA stenosis. 3. No cervical carotid artery or vertebral artery stenosis. These results were called by telephone at the time of interpretation on 09/15/2016 at 9:35 pm to Dr. Roland Rack , who verbally acknowledged these results. Electronically Signed   By: Logan Bores M.D.   On: 09/15/2016 21:52   Ct Head Code Stroke W/o Cm  Result Date: 09/15/2016 CLINICAL DATA:  Code stroke. RIGHT facial droop. History of hypertension. EXAM: CT HEAD WITHOUT CONTRAST TECHNIQUE: Contiguous axial images were obtained from the base of the skull through the vertex without intravenous contrast. COMPARISON:  None. FINDINGS: BRAIN: LEFT caudate and to lesser extent putaminal low-density infarct, without mass effect nor ex vacuo dilatation subjacent ventricle. Ventricles and sulci are overall normal for patient's age. No intraparenchymal hemorrhage, mass effect, midline shift or acute large vascular territory infarct. Patchy white matter supratentorial hypodensities. No abnormal extra-axial fluid collections. Basal cisterns are patent. VASCULAR: Mild calcific atherosclerosis of the carotid siphons. SKULL/SOFT TISSUES: No skull fracture. No significant soft tissue swelling. ORBITS/SINUSES: The included ocular globes and orbital contents are normal.The mastoid aircells and included paranasal sinuses are well-aerated. OTHER: None. ASPECTS [facility] Stroke Program Early CT Score) - Ganglionic level infarction (caudate, lentiform nuclei, internal capsule, insula, M1-M3 cortex): 6 - Supraganglionic infarction (M4-M6 cortex): 3 Total score (0-10 with 10 being normal): 10 IMPRESSION: 1. Acute to subacute appearing LEFT lenticulostriate lacunar infarct. Mild atherosclerosis. Mild to moderate white matter changes most consistent with chronic small vessel ischemic  disease, advanced for age. 2. ASPECTS is 9. Critical Value/emergent results were called by telephone at the time of interpretation on 09/15/2016 at 8:45 pm to Dr. Leonel Ramsay, Neurology, who verbally acknowledged these results. Electronically Signed   By: Elon Alas M.D.   On: 09/15/2016 20:48    Procedures Procedures (including critical care time)  Medications Ordered in ED Medications  iopamidol (ISOVUE-370) 76 % injection (50 mLs  Contrast Given 09/15/16 2058)  clopidogrel (PLAVIX) tablet 300 mg (300 mg Oral Given 09/15/16 2200)  aspirin tablet 325 mg (325 mg Oral Given 09/15/16 2200)     Initial Impression / Assessment and Plan / ED Course  I have reviewed the triage vital signs and the nursing notes.  Pertinent labs & imaging results that were available during my care of the patient were reviewed by me and considered in my medical decision making (see chart for details).   the patient's symptoms are concerning for a stroke. He has right-sided weakness associated with the right facial droop and some mild speech difficulties. Patient was activated as a code stroke initially. He is outside the TPA window.  Patient was evaluated by Dr. Leonel Ramsay. Plan for CT angiogram of the head for further evaluation.  CT  angiogram completed.  No indication for acute intervention.  Plan on medical admission  Final Clinical Impressions(s) / ED Diagnoses   Final diagnoses:  Cerebrovascular accident (CVA), unspecified mechanism ([place])    New Prescriptions New Prescriptions   No medications on file     Dorie Rank, MD 09/15/16 2302

## 2016-09-15 NOTE — ED Triage Notes (Signed)
Per EMS: Pt complaining of R sided facial droop, R arm weakness and slurred speech. LSN ~1pm today. Pt a/o x 4. Pt complaining of "feeling off balance."

## 2016-09-16 ENCOUNTER — Inpatient Hospital Stay (HOSPITAL_COMMUNITY): Payer: Medicare Other

## 2016-09-16 DIAGNOSIS — I6789 Other cerebrovascular disease: Secondary | ICD-10-CM

## 2016-09-16 LAB — ECHOCARDIOGRAM COMPLETE
Height: 73 in
Weight: 3913.6 oz

## 2016-09-16 LAB — RAPID URINE DRUG SCREEN, HOSP PERFORMED
Amphetamines: NOT DETECTED
Barbiturates: NOT DETECTED
Benzodiazepines: NOT DETECTED
Cocaine: POSITIVE — AB
Opiates: NOT DETECTED
Tetrahydrocannabinol: NOT DETECTED

## 2016-09-16 LAB — LIPID PANEL
Cholesterol: 235 mg/dL — ABNORMAL HIGH (ref 0–200)
HDL: 61 mg/dL (ref >40)
LDL Cholesterol: 159 mg/dL — ABNORMAL HIGH (ref 0–99)
Total CHOL/HDL Ratio: 3.9 RATIO
Triglycerides: 73 mg/dL (ref <150)
VLDL: 15 mg/dL (ref 0–40)

## 2016-09-16 MED ORDER — ATORVASTATIN CALCIUM 80 MG PO TABS
80.0000 mg | ORAL_TABLET | Freq: Every day | ORAL | Status: DC
  Administered 2016-09-16 – 2016-09-17 (×2): 80 mg via ORAL
  Filled 2016-09-16 (×2): qty 1

## 2016-09-16 MED ORDER — CLOPIDOGREL BISULFATE 75 MG PO TABS
75.0000 mg | ORAL_TABLET | Freq: Every day | ORAL | Status: DC
  Administered 2016-09-16: 75 mg via ORAL
  Filled 2016-09-16: qty 1

## 2016-09-16 MED ORDER — CLOPIDOGREL BISULFATE 75 MG PO TABS
75.0000 mg | ORAL_TABLET | Freq: Every day | ORAL | Status: DC
Start: 2016-09-16 — End: 2016-09-17
  Administered 2016-09-17: 75 mg via ORAL
  Filled 2016-09-16: qty 1

## 2016-09-16 NOTE — Progress Notes (Signed)
Rehab Admissions Coordinator Note:  Patient was screened by Cleatrice Burke for appropriateness for an Inpatient Acute Rehab Consult per PT and OT recommendations.   At this time, we are recommending Inpatient Rehab consult.Please place order.  Cleatrice Burke 09/16/2016, 1:27 PM  I can be reached at [phone].

## 2016-09-16 NOTE — Evaluation (Signed)
Speech Language Pathology Evaluation Patient Details Name: Randall Mccall MRN: [id number] DOB: 1971/04/12 Today's Date: 09/16/2016 Time: [id number] SLP Time Calculation (min) (ACUTE ONLY): 18 min  Problem List:  Patient Active Problem List   Diagnosis Date Noted  . Acute ischemic stroke ([place]) 09/15/2016  . Stroke (cerebrum) ([place]) 09/15/2016  . Hypertension 09/15/2016  . Vitamin B12 deficiency 08/10/2015  . Weakness 07/02/2013  . Abnormal CPK 07/02/2013   Past Medical History:  Past Medical History:  Diagnosis Date  . Gout   . Hypertension   . Muscle weakness    Past Surgical History:  Past Surgical History:  Procedure Laterality Date  . KNEE SURGERY     HPI:  46 y.o. male admitted to [facility] on 09/15/16 for right face, arm and leg weakness. Dx with subacute L lacunar infarct via CT scan.  Pt with significant PMHx of gout, learning disability, HTN, and knee surgery.  Passed stroke swallow screen and per RN has been tolerating diet well.   Assessment / Plan / Recommendation Clinical Impression  Pt admitted to hospital with a CVA (acute infarction within the left posterior limb of internal capsule), right sided weakness and dysarthria. Pt's receptive and expressive language are within functional limits for all tasks given. Patient reports baseline learning disability, however presented with moderate impairments in attention which he feels have worsened since his hospitalization. Patient also presents with mild to moderate dysarthria due to right sided facial weakness. Pt's intelligibilty increases with slowed speech, shorter phrases and over articulation. Recommend speech therapy to address cognitive communication deficit in attention, as well as dysarthria to increase intelligibility. Patient is a good candidate for inpatient rehab and would likely benefit from continuing [place]. SLP will follow acutely.    SLP Assessment  Patient needs continued Speech Lanaguage Pathology Services    Follow  Up Recommendations  Inpatient Rehab    Frequency and Duration min 2x/week  2 weeks      SLP Evaluation Cognition  Overall Cognitive Status: History of cognitive impairments - at baseline Arousal/Alertness: Awake/alert Orientation Level: Oriented X4 Attention: Sustained Sustained Attention: Impaired Sustained Attention Impairment: Verbal basic;Functional basic Memory: Impaired Memory Impairment: Decreased recall of new information Awareness: Appears intact Problem Solving: Appears intact Executive Function: Sequencing Sequencing: Impaired Sequencing Impairment: Verbal basic Safety/Judgment: Appears intact       Comprehension  Auditory Comprehension Overall Auditory Comprehension: Appears within functional limits for tasks assessed Visual Recognition/Discrimination Discrimination: Within Function Limits Reading Comprehension Reading Status: Not tested (baseline deficits per pt report)    Expression Expression Primary Mode of Expression: Verbal Verbal Expression Overall Verbal Expression: Appears within functional limits for tasks assessed Written Expression Dominant Hand: Right Written Expression: Not tested   Oral / Motor  Oral Motor/Sensory Function Overall Oral Motor/Sensory Function: Moderate impairment Facial ROM: Reduced right;Suspected CN VII (facial) dysfunction Facial Symmetry: Abnormal symmetry right;Suspected CN VII (facial) dysfunction Facial Strength: Reduced right;Suspected CN VII (facial) dysfunction Facial Sensation: Within Functional Limits Lingual ROM: Within Functional Limits Lingual Symmetry: Within Functional Limits Lingual Strength: Within Functional Limits Velum: Within Functional Limits Mandible: Within Functional Limits Motor Speech Overall Motor Speech: Impaired Respiration: Within functional limits Phonation: Normal Resonance: Within functional limits Articulation: Impaired Level of Impairment: Sentence Intelligibility:  Intelligibility reduced Word: 75-100% accurate Phrase: 75-100% accurate Sentence: 50-74% accurate Conversation: 50-74% accurate Motor Planning: Witnin functional limits Motor Speech Errors: Consistent;Aware Effective Techniques: Over-articulate;Slow rate   GO  Deneise Lever, [place] CF-SLP Speech-Language Pathologist [phone]  Aliene Altes 09/16/2016, 5:28 PM

## 2016-09-16 NOTE — Evaluation (Addendum)
Occupational Therapy Evaluation Patient Details Name: Randall Mccall MRN: [id number] DOB: 1971-03-19 Today's Date: 09/16/2016    History of Present Illness 46 y.o. male admitted to [facility] on 09/15/16 for right face, arm and leg weakness. Dx with subacute L lacunar infarct via CT scan.  Pt with significant PMHx of gout, learning disability, HTN, and knee surgery.     Clinical Impression   This 46 yo male admitted with above presents to acute OT with deficits below (see OT problem list) thus affecting his PLOF to being totally independent with basic and IADLs pta. He will benefit from acute OT with follow up OT on CIR to get back to as close to PLOF before returning home with brother. BP in supine 158/117 decide to sit pt up to see what BP would do 165/128--thus deferred OOB at this time and made RN aware.    Follow Up Recommendations  CIR;Supervision/Assistance - 24 hour    Equipment Recommendations  Other (comment) (TBD at next venue)       Precautions / Restrictions Precautions Precautions: Fall Precaution Comments: due to right sided weakness.       Mobility Bed Mobility Overal bed mobility: Needs Assistance Bed Mobility: Rolling;Sidelying to Sit;Sit to Sidelying Rolling: Min guard Sidelying to sit: Mod assist;+2 for physical assistance     Sit to sidelying: Min guard General bed mobility comments: Cues for technique to roll to his right side.  Mod assist to support trunk during transition to sit EOB.  Pt able to get back in the bed with min guard assist only to help ensure his right leg got back into the bed, but gravity and momentum were used to complete task.  Transfers                 General transfer comment: NT due to BP outside of MD's parameters.     Balance Overall balance assessment: Needs assistance Sitting-balance support: Feet supported;Single extremity supported Sitting balance-Leahy Scale: Fair Sitting balance - Comments: supervision EOB once feet  supported under him and left upper extremity proped.                                     ADL Overall ADL's : Needs assistance/impaired Eating/Feeding: Set up;Sitting;Bed level Eating/Feeding Details (indicate cue type and reason): has to use non-dominant arm and has tremors in his hand as well (pt reports these are not new) Grooming: Wash/dry face;Sitting   Upper Body Bathing: Minimal assistance;Sitting   Lower Body Bathing: Maximal assistance;Sitting/lateral leans   Upper Body Dressing : Maximal assistance;Sitting   Lower Body Dressing: Total assistance;Sitting/lateral leans                       Vision Vision Assessment?: Yes Eye Alignment: Within Functional Limits Ocular Range of Motion: Within Functional Limits Alignment/Gaze Preference: Within Defined Limits Tracking/Visual Pursuits: Able to track stimulus in all quads without difficulty Convergence: Within functional limits Visual Fields: No apparent deficits Additional Comments: Pt with ptosis of left eye          Pertinent Vitals/Pain Pain Assessment: No/denies pain     Hand Dominance Right   Extremity/Trunk Assessment Upper Extremity Assessment Upper Extremity Assessment: LUE deficits/detail RUE Deficits / Details: Pt has minimal movement of arm for shoulder extension, elbow extension, forearm supination. No AROM for other opposite movements or wrist/hand. PROM WNL throughout RUE Coordination: decreased fine  motor;decreased gross motor LUE Deficits / Details: tremors which does affect being able to eat things that easily fall off of spoon/or cannot really eat with fork   Lower Extremity Assessment Lower Extremity Assessment: RLE deficits/detail RLE Deficits / Details: right leg with intact sensation to LT, ankle DF 3-/5, PF 4/5, knee extension 3-/5, hip flexion 3-/5   Cervical / Trunk Assessment Cervical / Trunk Assessment: Normal   Communication Communication Communication: Other  (comment) (some slurred speech)   Cognition Arousal/Alertness: Awake/alert Behavior During Therapy: WFL for tasks assessed/performed Overall Cognitive Status:  (some learning disability at baseline per chart)                                Home Living Family/patient expects to be discharged to:: Inpatient rehab Living Arrangements: Other relatives;Other (Comment) (brother) Available Help at Discharge: Family Type of Home: House Home Access: Stairs to enter Technical brewer of Steps: 3 Entrance Stairs-Rails: Right Home Layout: One level     Bathroom Shower/Tub: Walk-in Hydrologist: Standard     Home Equipment: None          Prior Functioning/Environment Level of Independence: Independent        Comments: has not worked in 3 years.        OT Problem List: Decreased strength;Decreased range of motion;Impaired balance (sitting and/or standing);Decreased coordination;Impaired tone;Obesity;Impaired UE functional use   OT Treatment/Interventions: Self-care/ADL training;Balance training;Therapeutic exercise;Neuromuscular education;Therapeutic activities;DME and/or AE instruction;Patient/family education    OT Goals(Current goals can be found in the care plan section) Acute Rehab OT Goals Patient Stated Goal: did not state today OT Goal Formulation: With patient Time For Goal Achievement: 09/30/16 Potential to Achieve Goals: Good  OT Frequency: Min 3X/week           Co-evaluation PT/OT/SLP Co-Evaluation/Treatment: Yes (partial) Reason for Co-Treatment: Complexity of the patient's impairments (multi-system involvement) PT goals addressed during session: Mobility/safety with mobility;Balance OT goals addressed during session: ADL's and self-care;Strengthening/ROM      End of Session Nurse Communication: Mobility status (BP up)  Activity Tolerance: Other (comment) (limited by increase in BP over what high levels had been  specfied by MD) Patient left: in bed;with call bell/phone within reach;with bed alarm set   Time: [id number] OT Time Calculation (min): 37 min Charges:  OT General Charges $OT Visit: 1 Procedure OT Evaluation $OT Eval Moderate Complexity: 1 Procedure  Almon Register [id number] 09/16/2016, 11:14 AM

## 2016-09-16 NOTE — Progress Notes (Signed)
PROGRESS NOTE    Randall Mccall  [id number] DOB: [birth year] DOA: 09/15/2016 PCP: Campbell Lerner, MD    Brief Narrative:  46 year old male Known history of learning disability Hypertension Gout with recent treatment in the emergency The past has been for abnormal EMGs and possible myopathy  Patient had acute right facial droop and right arm weakness around 1 PM 1/20 and CT showed subacute lacunar infarct left side Neurology was consulted   Assessment & Plan:   Principal Problem:   Acute ischemic stroke [facility]) Active Problems:   Stroke (cerebrum) ([place])   Hypertension   acute CVA-echocardiogram, Plavix + aspirin for now. Total cholesterol 235 LDL 159 so placed on high intensity statin Lipitor 80.  Continue normal saline 75 hour for now Will need physical and occupational therapy and may benefit from either a skilled facility placement or inpatient rehabilitation Hypertension-allow permissive hypertension with labetalol every 2 when necessary for systolics greater than [id number].  Right now holding off of Norvasc 10 and on clonidine 0.1 bid Gout reorder allopurinol 100 daily holding colchicine for now ckd 1-2-stable.  Labs in am Learning disability-only moderate Prior myopathy followed by Dr. Arna Snipe as out patient  DVT prophylaxis: Lovenox Code Status: Full Family Communication: none +at bedsdie Disposition Plan: inpatient pending resolution   Consultants:   neuro  Procedures:   multiple  Antimicrobials:   none    Subjective:  awake alert in nad tol diet No issues eatign and drinking Still dense deficit RUE  Objective: Vitals:   09/16/16 0114 09/16/16 0300 09/16/16 0500 09/16/16 0700  BP: (!) 158/114 (!) 153/97 (!) 146/108 (!) 155/99  Pulse: 77 70 71 68  Resp: 20 18 18 18   Temp: 97.9 F (36.6 C) 97.5 F (36.4 C) 97.9 F (36.6 C) 97.8 F (36.6 C)  TempSrc: Oral Oral Oral Oral  SpO2: 100% 96% 99% 97%  Weight: 110.9 kg (244 lb 9.6 oz)     Height:  6\' 1"  (1.854 m)      No intake or output data in the 24 hours ending 09/16/16 0849 Filed Weights   09/16/16 0114  Weight: 110.9 kg (244 lb 9.6 oz)    Examination:  General exam: Appears calm and comfortable  Respiratory system: Clear to auscultation. Respiratory effort normal. Cardiovascular system: S1 & S2 heard, RRR. No JVD,-tele benign Gastrointestinal system: Abdomen is nondistended, soft and nontender Central nervous system: Alert and oriented. Dense right-sided upper paresis, slightly weaker in dorsi and Lantus flexion on the right knee and ankle. Cannot raise her right lower extremity off the bed as well as left. Only flicker of movement in right upper extremity Skin: No rashes, lesions or ulcers Psychiatry: Judgement and insight appear normal. Mood & affect appropriate.     Data Reviewed: I have personally reviewed following labs and imaging studies  CBC:  Recent Labs Lab 09/15/16 2028 09/15/16 2039  WBC 7.5  --   NEUTROABS 3.8  --   HGB 16.1 17.3*  HCT 47.9 51.0  MCV 91.8  --   PLT 313  --    Basic Metabolic Panel:  Recent Labs Lab 09/15/16 2028 09/15/16 2039  NA 136 141  K 3.7 3.9  CL 102 104  CO2 20*  --   GLUCOSE 107* 108*  BUN 16 20  CREATININE 1.27* 1.30*  CALCIUM 9.5  --    GFR: Estimated Creatinine Clearance: 93.7 mL/min (by C-G formula based on SCr of 1.3 mg/dL (H)). Liver Function Tests:  Recent Labs Lab 09/15/16  2028  AST 37  ALT 21  ALKPHOS 85  BILITOT 0.5  PROT 8.0  ALBUMIN 4.5   No results for input(s): LIPASE, AMYLASE in the last 168 hours. No results for input(s): AMMONIA in the last 168 hours. Coagulation Profile:  Recent Labs Lab 09/15/16 2028  INR 0.89   Cardiac Enzymes: No results for input(s): CKTOTAL, CKMB, CKMBINDEX, TROPONINI in the last 168 hours. BNP (last 3 results) No results for input(s): PROBNP in the last 8760 hours. HbA1C: No results for input(s): HGBA1C in the last 72 hours. CBG: No results for  input(s): GLUCAP in the last 168 hours. Lipid Profile:  Recent Labs  09/16/16 0231  CHOL 235*  HDL 61  LDLCALC 159*  TRIG 73  CHOLHDL 3.9   Thyroid Function Tests: No results for input(s): TSH, T4TOTAL, FREET4, T3FREE, THYROIDAB in the last 72 hours. Anemia Panel: No results for input(s): VITAMINB12, FOLATE, FERRITIN, TIBC, IRON, RETICCTPCT in the last 72 hours. Sepsis Labs: No results for input(s): PROCALCITON, LATICACIDVEN in the last 168 hours.  No results found for this or any previous visit (from the past 240 hour(s)).       Radiology Studies: Ct Angio Head W Or Wo Contrast  Result Date: 09/15/2016 CLINICAL DATA:  Right facial droop, right arm weakness, and slurred speech. EXAM: CT ANGIOGRAPHY HEAD AND NECK TECHNIQUE: Multidetector CT imaging of the head and neck was performed using the standard protocol during bolus administration of intravenous contrast. Multiplanar CT image reconstructions and MIPs were obtained to evaluate the vascular anatomy. Carotid stenosis measurements (when applicable) are obtained utilizing NASCET criteria, using the distal internal carotid diameter as the denominator. CONTRAST:  50 mL Isovue 370 COMPARISON:  None. FINDINGS: CTA NECK FINDINGS Aortic arch: 3 vessel aortic arch. Widely patent brachiocephalic and subclavian arteries. Right carotid system: Patent without evidence of stenosis or dissection. Minimal soft plaque at the carotid bifurcation. Left carotid system: Patent without evidence of stenosis or dissection. Mild, predominantly soft plaque at the carotid bifurcation. Vertebral arteries: Patent without evidence of stenosis or dissection. Left vertebral artery is strongly dominant. Skeleton: Mild cervical spondylosis. Other neck: No mass or lymph node enlargement. Upper chest: Mild motion artifact through the lung apices with dependent subsegmental atelectasis. Review of the MIP images confirms the above findings CTA HEAD FINDINGS Anterior  circulation: The internal carotid arteries are patent from skullbase to carotid termini with bilateral siphon atherosclerosis. There is moderate right anterior cavernous segment stenosis. The MCAs are patent with an early bifurcation noted on the left. There is nonocclusive thrombus versus atherosclerotic narrowing involving the left MCA bifurcation, predominantly the superior M2 division which is severely narrowed and thready in appearance over a 6 mm length. There is moderate stenosis of the proximal M2 inferior division. The right MCA and both ACAs are patent without evidence of major branch occlusion or significant proximal stenosis. Mild diffuse branch vessel irregularity is present. The left A1 segment is dominant. No aneurysm. Posterior circulation: Intracranial left vertebral artery is widely patent and supplies the basilar. The right vertebral artery ends in PICA. Left PICA is also patent. The basilar artery is patent and tortuous with mild proximal and mid segment stenoses. SCA origins are patent. There is a patent left posterior communicating artery with hypoplastic left P1 segment. No significant PCA stenosis is identified. No aneurysm. Venous sinuses: Grossly patent within limitations of arterial phase dominant contrast timing. Anatomic variants: None of significance. Review of the MIP images confirms the above findings IMPRESSION: 1. High-grade  atherosclerotic stenosis versus nonocclusive thrombus at the left MCA bifurcation, predominantly involving the M2 superior division. 2. Moderate right cavernous ICA stenosis. 3. No cervical carotid artery or vertebral artery stenosis. These results were called by telephone at the time of interpretation on 09/15/2016 at 9:35 pm to Dr. Roland Rack , who verbally acknowledged these results. Electronically Signed   By: Logan Bores M.D.   On: 09/15/2016 21:52   Ct Angio Neck W Or Wo Contrast  Result Date: 09/15/2016 CLINICAL DATA:  Right facial droop,  right arm weakness, and slurred speech. EXAM: CT ANGIOGRAPHY HEAD AND NECK TECHNIQUE: Multidetector CT imaging of the head and neck was performed using the standard protocol during bolus administration of intravenous contrast. Multiplanar CT image reconstructions and MIPs were obtained to evaluate the vascular anatomy. Carotid stenosis measurements (when applicable) are obtained utilizing NASCET criteria, using the distal internal carotid diameter as the denominator. CONTRAST:  50 mL Isovue 370 COMPARISON:  None. FINDINGS: CTA NECK FINDINGS Aortic arch: 3 vessel aortic arch. Widely patent brachiocephalic and subclavian arteries. Right carotid system: Patent without evidence of stenosis or dissection. Minimal soft plaque at the carotid bifurcation. Left carotid system: Patent without evidence of stenosis or dissection. Mild, predominantly soft plaque at the carotid bifurcation. Vertebral arteries: Patent without evidence of stenosis or dissection. Left vertebral artery is strongly dominant. Skeleton: Mild cervical spondylosis. Other neck: No mass or lymph node enlargement. Upper chest: Mild motion artifact through the lung apices with dependent subsegmental atelectasis. Review of the MIP images confirms the above findings CTA HEAD FINDINGS Anterior circulation: The internal carotid arteries are patent from skullbase to carotid termini with bilateral siphon atherosclerosis. There is moderate right anterior cavernous segment stenosis. The MCAs are patent with an early bifurcation noted on the left. There is nonocclusive thrombus versus atherosclerotic narrowing involving the left MCA bifurcation, predominantly the superior M2 division which is severely narrowed and thready in appearance over a 6 mm length. There is moderate stenosis of the proximal M2 inferior division. The right MCA and both ACAs are patent without evidence of major branch occlusion or significant proximal stenosis. Mild diffuse branch vessel  irregularity is present. The left A1 segment is dominant. No aneurysm. Posterior circulation: Intracranial left vertebral artery is widely patent and supplies the basilar. The right vertebral artery ends in PICA. Left PICA is also patent. The basilar artery is patent and tortuous with mild proximal and mid segment stenoses. SCA origins are patent. There is a patent left posterior communicating artery with hypoplastic left P1 segment. No significant PCA stenosis is identified. No aneurysm. Venous sinuses: Grossly patent within limitations of arterial phase dominant contrast timing. Anatomic variants: None of significance. Review of the MIP images confirms the above findings IMPRESSION: 1. High-grade atherosclerotic stenosis versus nonocclusive thrombus at the left MCA bifurcation, predominantly involving the M2 superior division. 2. Moderate right cavernous ICA stenosis. 3. No cervical carotid artery or vertebral artery stenosis. These results were called by telephone at the time of interpretation on 09/15/2016 at 9:35 pm to Dr. Roland Rack , who verbally acknowledged these results. Electronically Signed   By: Logan Bores M.D.   On: 09/15/2016 21:52   Mr Brain Wo Contrast  Result Date: 09/16/2016 CLINICAL DATA:  46 y/o  M; right upper extremity weakness. EXAM: MRI HEAD WITHOUT CONTRAST MRA HEAD WITHOUT CONTRAST TECHNIQUE: Multiplanar, multiecho pulse sequences of the brain and surrounding structures were obtained without intravenous contrast. Angiographic images of the head were obtained using MRA technique without  contrast. COMPARISON:  09/15/2016 CT of the head and CT angiogram head and neck. FINDINGS: MRI HEAD FINDINGS Brain: 9 x 5 mm focus of diffusion restriction consistent with acute infarct within the left posterior limb of internal capsule. Foci of T2 FLAIR hyperintense signal abnormality in subcortical and periventricular white matter spine nonspecific but probably represent advanced chronic  microvascular ischemic changes for patient's age. Chronic hemosiderin stained lacunar infarct within the left caudate head. Vascular: As below. Skull and upper cervical spine: Normal marrow signal. Sinuses/Orbits: Negative. Other: None. MRA HEAD FINDINGS Internal carotid arteries:  Patent. Anterior cerebral arteries:  Patent. Middle cerebral arteries: Patent. Mild-to-moderate stenosis of left M2 superior division origin (series 951, image 11). The segment of stenosis is less conspicuous when compared with the prior CTA of the head. Anterior communicating artery: Patent. Posterior communicating arteries: Patent large left and small right. Posterior cerebral arteries:  Patent. Basilar artery: Mild proximal stenosis. Tortuous course with large rightward loop. Vertebral arteries:  Patent bilaterally. No evidence of high-grade stenosis, large vessel occlusion, or aneurysm unless noted above. IMPRESSION: 1. Subcentimeter focus of acute infarction within the left posterior limb of internal capsule. No acute hemorrhage identified. 2. Nonspecific foci of T2 FLAIR hyperintense signal abnormality in white matter probably represents advanced chronic microvascular ischemic changes for patient's age. Chronic lacunar infarct within the left caudate head. 3. Patent circle of Willis without evidence for high-grade stenosis, large vessel occlusion or aneurysm. 4. Long segment of mild-to-moderate stenosis of left M2 prox superior division, less conspicuous when compared with prior CT angiogram of the head. Electronically Signed   By: Kristine Garbe M.D.   On: 09/16/2016 01:13   Mr Jodene Nam Head/brain [id number] Cm  Result Date: 09/16/2016 CLINICAL DATA:  46 y/o  M; right upper extremity weakness. EXAM: MRI HEAD WITHOUT CONTRAST MRA HEAD WITHOUT CONTRAST TECHNIQUE: Multiplanar, multiecho pulse sequences of the brain and surrounding structures were obtained without intravenous contrast. Angiographic images of the head were obtained  using MRA technique without contrast. COMPARISON:  09/15/2016 CT of the head and CT angiogram head and neck. FINDINGS: MRI HEAD FINDINGS Brain: 9 x 5 mm focus of diffusion restriction consistent with acute infarct within the left posterior limb of internal capsule. Foci of T2 FLAIR hyperintense signal abnormality in subcortical and periventricular white matter spine nonspecific but probably represent advanced chronic microvascular ischemic changes for patient's age. Chronic hemosiderin stained lacunar infarct within the left caudate head. Vascular: As below. Skull and upper cervical spine: Normal marrow signal. Sinuses/Orbits: Negative. Other: None. MRA HEAD FINDINGS Internal carotid arteries:  Patent. Anterior cerebral arteries:  Patent. Middle cerebral arteries: Patent. Mild-to-moderate stenosis of left M2 superior division origin (series 951, image 11). The segment of stenosis is less conspicuous when compared with the prior CTA of the head. Anterior communicating artery: Patent. Posterior communicating arteries: Patent large left and small right. Posterior cerebral arteries:  Patent. Basilar artery: Mild proximal stenosis. Tortuous course with large rightward loop. Vertebral arteries:  Patent bilaterally. No evidence of high-grade stenosis, large vessel occlusion, or aneurysm unless noted above. IMPRESSION: 1. Subcentimeter focus of acute infarction within the left posterior limb of internal capsule. No acute hemorrhage identified. 2. Nonspecific foci of T2 FLAIR hyperintense signal abnormality in white matter probably represents advanced chronic microvascular ischemic changes for patient's age. Chronic lacunar infarct within the left caudate head. 3. Patent circle of Willis without evidence for high-grade stenosis, large vessel occlusion or aneurysm. 4. Long segment of mild-to-moderate stenosis of left M2 prox superior  division, less conspicuous when compared with prior CT angiogram of the head. Electronically  Signed   By: Kristine Garbe M.D.   On: 09/16/2016 01:13   Ct Head Code Stroke W/o Cm  Result Date: 09/15/2016 CLINICAL DATA:  Code stroke. RIGHT facial droop. History of hypertension. EXAM: CT HEAD WITHOUT CONTRAST TECHNIQUE: Contiguous axial images were obtained from the base of the skull through the vertex without intravenous contrast. COMPARISON:  None. FINDINGS: BRAIN: LEFT caudate and to lesser extent putaminal low-density infarct, without mass effect nor ex vacuo dilatation subjacent ventricle. Ventricles and sulci are overall normal for patient's age. No intraparenchymal hemorrhage, mass effect, midline shift or acute large vascular territory infarct. Patchy white matter supratentorial hypodensities. No abnormal extra-axial fluid collections. Basal cisterns are patent. VASCULAR: Mild calcific atherosclerosis of the carotid siphons. SKULL/SOFT TISSUES: No skull fracture. No significant soft tissue swelling. ORBITS/SINUSES: The included ocular globes and orbital contents are normal.The mastoid aircells and included paranasal sinuses are well-aerated. OTHER: None. ASPECTS [facility] Stroke Program Early CT Score) - Ganglionic level infarction (caudate, lentiform nuclei, internal capsule, insula, M1-M3 cortex): 6 - Supraganglionic infarction (M4-M6 cortex): 3 Total score (0-10 with 10 being normal): 10 IMPRESSION: 1. Acute to subacute appearing LEFT lenticulostriate lacunar infarct. Mild atherosclerosis. Mild to moderate white matter changes most consistent with chronic small vessel ischemic disease, advanced for age. 2. ASPECTS is 9. Critical Value/emergent results were called by telephone at the time of interpretation on 09/15/2016 at 8:45 pm to Dr. Leonel Ramsay, Neurology, who verbally acknowledged these results. Electronically Signed   By: Elon Alas M.D.   On: 09/15/2016 20:48        Scheduled Meds: . allopurinol  100 mg Oral Daily  . aspirin  300 mg Rectal Daily   Or  . aspirin   325 mg Oral Daily  . clopidogrel  75 mg Oral Daily  . enoxaparin (LOVENOX) injection  40 mg Subcutaneous Daily   Continuous Infusions: . sodium chloride 75 mL/hr at 09/16/16 0130     LOS: 1 day    Time spent: [place], Blyn, MD [facility] Pager [phone]  If 7PM-7AM, please contact night-coverage [email or web address] Password [facility] 09/16/2016, 8:49 AM

## 2016-09-16 NOTE — Evaluation (Signed)
Physical Therapy Evaluation Patient Details Name: Randall Mccall MRN: [id number] DOB: 07-08-1971 Today's Date: 09/16/2016   History of Present Illness  46 y.o. male admitted to [facility] on 09/15/16 for right face, arm and leg weakness. Dx with subacute L lacunar infarct via CT scan.  Pt with significant PMHx of gout, learning disability, HTN, and knee surgery.    Clinical Impression  Pt was able to sit EOB with mod assist, however, we did not progress to OOB mobility today due to BP outside of MD's stated parameters.  RN made aware.  See OT notes for specific BPs.  Pt would benefit from inpatient rehab before d/c home with his family's assist.   PT to follow acutely for deficits listed below.       Follow Up Recommendations CIR    Equipment Recommendations  Other (comment) (to be assessed after standing, transfers, and gait)    Recommendations for Other Services Rehab consult     Precautions / Restrictions Precautions Precautions: Fall Precaution Comments: due to right sided weakness.       Mobility  Bed Mobility Overal bed mobility: Needs Assistance Bed Mobility: Rolling;Sidelying to Sit;Sit to Sidelying Rolling: Min guard Sidelying to sit: Mod assist;+2 for physical assistance     Sit to sidelying: Min guard General bed mobility comments: Cues for technique to roll to his right side.  Mod assist to support trunk during transition to sit EOB.  Pt able to get back in the bed with min guard assist only to help ensure his right leg got back into the bed, but gravity and momentum were used to complete task.  Transfers                 General transfer comment: NT due to BP outside of MD's parameters.       Modified Rankin (Stroke Patients Only) Modified Rankin (Stroke Patients Only) Pre-Morbid Rankin Score: No symptoms Modified Rankin: Severe disability     Balance Overall balance assessment: Needs assistance Sitting-balance support: Feet supported;Single extremity  supported Sitting balance-Leahy Scale: Fair Sitting balance - Comments: supervision EOB once feet supported under him and left upper extremity proped.                                      Pertinent Vitals/Pain Pain Assessment: No/denies pain    Home Living Family/patient expects to be discharged to:: Private residence Living Arrangements: Other relatives;Other (Comment) (brother) Available Help at Discharge: Family Type of Home: House Home Access: Stairs to enter Entrance Stairs-Rails: Right Entrance Stairs-Number of Steps: 3 Home Layout: One level Home Equipment: None      Prior Function Level of Independence: Independent         Comments: has not worked in 3 years.     Hand Dominance   Dominant Hand: Right    Extremity/Trunk Assessment   Upper Extremity Assessment Upper Extremity Assessment: Defer to OT evaluation    Lower Extremity Assessment Lower Extremity Assessment: RLE deficits/detail RLE Deficits / Details: right leg with intact sensation to LT, ankle DF 3-/5, PF 4/5, knee extension 3-/5, hip flexion 3-/5    Cervical / Trunk Assessment Cervical / Trunk Assessment: Normal  Communication   Communication: Other (comment) (some slurred speech)  Cognition Arousal/Alertness: Awake/alert Behavior During Therapy: WFL for tasks assessed/performed Overall Cognitive Status:  (some learning disability at baseline per chart)  General Comments General comments (skin integrity, edema, etc.): RN made aware of BPs.  Pt with some right sided inattention.          Assessment/Plan    PT Assessment Patient needs continued PT services  PT Problem List Decreased strength;Decreased activity tolerance;Decreased balance;Decreased mobility;Decreased knowledge of use of DME          PT Treatment Interventions DME instruction;Gait training;Stair training;Functional mobility training;Therapeutic activities;Therapeutic  exercise;Balance training;Neuromuscular re-education;Patient/family education    PT Goals (Current goals can be found in the Care Plan section)  Acute Rehab PT Goals Patient Stated Goal: did not state today PT Goal Formulation: With patient Time For Goal Achievement: 09/30/16 Potential to Achieve Goals: Good    Frequency Min 4X/week        Co-evaluation PT/OT/SLP Co-Evaluation/Treatment: Yes Reason for Co-Treatment: Complexity of the patient's impairments (multi-system involvement);For patient/therapist safety PT goals addressed during session: Mobility/safety with mobility;Balance         End of Session   Activity Tolerance: Other (comment) (limited by BP outside of MD recommended parameters) Patient left: in bed;with call bell/phone within reach;with bed alarm set Nurse Communication: Mobility status         Time: [id number] PT Time Calculation (min) (ACUTE ONLY): 14 min   Charges:   PT Evaluation $PT Eval Moderate Complexity: 1 Procedure          Rebecca B. Falconer, [place], DPT [phone]   09/16/2016, 11:03 AM

## 2016-09-16 NOTE — Progress Notes (Signed)
STROKE TEAM PROGRESS NOTE   HISTORY OF PRESENT ILLNESS (per record) Randall Mccall is a 46 y.o. male who was in his normal state until around 1 PM at which point he began having right-sided weakness. He decided to lay down and see if things would get better, but when he awoke it had not therefore he presented to the emergency room.   SUBJECTIVE (INTERVAL HISTORY) No family members present. The patient notes continued right sided weakness upper extremity more so than the lower extremity. Dr. Leonie Man discussed the patient's stroke, rehabilitation, and risk factor modification.   OBJECTIVE Temp:  [97.5 F (36.4 C)-98.8 F (37.1 C)] 98.8 F (37.1 C) (01/21 1343) Pulse Rate:  [68-92] 80 (01/21 1343) Cardiac Rhythm: Normal sinus rhythm (01/21 0700) Resp:  [14-22] 18 (01/21 1343) BP: (138-193)/(97-129) 144/112 (01/21 1343) SpO2:  [94 %-100 %] 96 % (01/21 1343) Weight:  [110.9 kg (244 lb 9.6 oz)] 110.9 kg (244 lb 9.6 oz) (01/21 0114)  CBC:   Recent Labs Lab 09/15/16 2028 09/15/16 2039  WBC 7.5  --   NEUTROABS 3.8  --   HGB 16.1 17.3*  HCT 47.9 51.0  MCV 91.8  --   PLT 313  --     Basic Metabolic Panel:   Recent Labs Lab 09/15/16 2028 09/15/16 2039  NA 136 141  K 3.7 3.9  CL 102 104  CO2 20*  --   GLUCOSE 107* 108*  BUN 16 20  CREATININE 1.27* 1.30*  CALCIUM 9.5  --     Lipid Panel:     Component Value Date/Time   CHOL 235 (H) 09/16/2016 0231   TRIG 73 09/16/2016 0231   HDL 61 09/16/2016 0231   CHOLHDL 3.9 09/16/2016 0231   VLDL 15 09/16/2016 0231   LDLCALC 159 (H) 09/16/2016 0231   HgbA1c: No results found for: HGBA1C Urine Drug Screen: No results found for: LABOPIA, COCAINSCRNUR, LABBENZ, AMPHETMU, THCU, LABBARB    IMAGING  Ct Angio Head and Neck W Or Wo Contrast 09/15/2016 1. High-grade atherosclerotic stenosis versus nonocclusive thrombus at the left MCA bifurcation, predominantly involving the M2 superior division.  2. Moderate right cavernous ICA  stenosis.  3. No cervical carotid artery or vertebral artery stenosis.      Mr Jodene Nam Head/brain Wo Cm 09/16/2016 1. Subcentimeter focus of acute infarction within the left posterior limb of internal capsule.   No acute hemorrhage identified.  2. Nonspecific foci of T2 FLAIR hyperintense signal abnormality in white matter probably represents advanced chronic microvascular ischemic changes for patient's age. Chronic lacunar infarct within the left caudate head.  3. Patent circle of Willis without evidence for high-grade stenosis, large vessel occlusion or aneurysm.  4. Long segment of mild-to-moderate stenosis of left M2 prox superior division, less conspicuous when compared with prior CT angiogram of the head.     Ct Head Code Stroke W/o Cm 09/15/2016 1. Acute to subacute appearing LEFT lenticulostriate lacunar infarct. Mild atherosclerosis. Mild to moderate white matter changes most consistent with chronic small vessel ischemic disease, advanced for age.  2. ASPECTS is 9.      PHYSICAL EXAM Pleasant middle aged obese african [place] male not in distress. . Afebrile. Head is nontraumatic. Neck is supple without bruit.    Cardiac exam no murmur or gallop. Lungs are clear to auscultation. Distal pulses are well felt.  Neurological Exam :  Awake alert oriented to time place and person. Speech mild dysarthria. Right lower facial weakness. Extraocular movements full range w  Blinks to threat bilaterally. Fundi were not visualized. Tongue is midline. Right upper extremity 0/5 strength hypotonia. Right lower extremity 4/5 strength with mild weakness of hip flexors and ankle dorsiflexors  Normal strength on the left side. Sensation is intact bilaterally. Coordination is normal except cannot test in RUE. Right plantar is upgoing. Left is downgoing. Gait not tested.     ASSESSMENT/PLAN Mr. Randall Mccall is a 46 y.o. male with history of hypertension and gout presenting with right-sided weakness.  He did not receive IV t-PA due to a presentation.  Stroke:  Dominant infarct likely  Small vessel disease but MRA shows moderate stenosis left M2 segment.  Resultant - right hemiparesis  MRI - Subcentimeter focus of acute infarction within the left posterior limb of internal capsule.    Chronic lacunar infarct within the left caudate head.   MRA - Long segment of mild-to-moderate stenosis of left M2 prox superior division.  Carotid Doppler - CTA neck  CTA H&N - High-grade atherosclerotic stenosis versus nonocclusive thrombus at the left MCA bifurcation.  2D Echo - pending  LDL - 159  HgbA1c - pending  VTE prophylaxis - Lovenox Diet Heart Room service appropriate? Yes; Fluid consistency: Thin  No antithrombotic prior to admission, now on aspirin 325 mg daily (Plavix 300 mg times one given Saturday)  Patient counseled to be compliant with his antithrombotic medications  Ongoing aggressive stroke risk factor management  Therapy recommendations: CIR recommended - MD consult ordered.  Disposition: Pending  Hypertension  Blood pressure running somewhat high  Permissive hypertension (OK if < 220/120) but gradually normalize in 5-7 days  Long-term BP goal normotensive  Hyperlipidemia  Home meds: No lipid lowering medications prior to admission.  LDL 159, goal < 70  Now on Lipitor 80 mg daily  Continue statin at discharge    Other Stroke Risk Factors  ETOH use, advised to drink no more than 1 drink per day  Obesity, Body mass index is 32.27 kg/m., recommend weight loss, diet and exercise as appropriate   Previous stroke by imaging   Other Active Problems  UDS - pending  Creatinine - 1.30  PLAN  CIR consult  Aspirin 325 mg daily alone and no need for plavix due to small vessel disease infarct no role for dual antiplatelet therapy.   Hospital day # 1  Mikey Bussing PA-C [facility] Pager [phone] 09/16/2016, 3:05 PM I have  personally examined this patient, reviewed notes, independently viewed imaging studies, participated in medical decision making and plan of care.ROS completed by me personally and pertinent positives fully documented  I have made any additions or clarifications directly to the above note. Agree with note above. He has presented with left brain subcortical infarct from small vessel disease and has coincident modertae intracranial atherosclerosis but asymptomatic.Recommend Aspirin 325 mg daily alone and no need for plavix due to small vessel disease infarct no role for dual antiplatelet therapy. Aggressive risk factor modification and ongoing stroke evaluation. Greater than 50% time during this 35 minute visit was spent on counselling and coordination of care about stroke, risk, evaluation, prevention and treatment. Antony Contras, MD Medical Director [place] Pager: [phone] 09/16/2016 5:17 PM  Antony Contras, MD Medical Director [facility] Pager: [phone] 09/16/2016 5:14 PM   To contact Stroke Continuity provider, please refer to [email or web address] After hours, contact General Neurology

## 2016-09-16 NOTE — Progress Notes (Signed)
  Echocardiogram 2D Echocardiogram has been performed.  Tresa Res 09/16/2016, 2:36 PM

## 2016-09-17 DIAGNOSIS — G8111 Spastic hemiplegia affecting right dominant side: Secondary | ICD-10-CM

## 2016-09-17 DIAGNOSIS — F1410 Cocaine abuse, uncomplicated: Secondary | ICD-10-CM

## 2016-09-17 DIAGNOSIS — I10 Essential (primary) hypertension: Secondary | ICD-10-CM

## 2016-09-17 DIAGNOSIS — I639 Cerebral infarction, unspecified: Secondary | ICD-10-CM

## 2016-09-17 DIAGNOSIS — M1000 Idiopathic gout, unspecified site: Secondary | ICD-10-CM

## 2016-09-17 DIAGNOSIS — G8191 Hemiplegia, unspecified affecting right dominant side: Secondary | ICD-10-CM

## 2016-09-17 DIAGNOSIS — I69331 Monoplegia of upper limb following cerebral infarction affecting right dominant side: Secondary | ICD-10-CM

## 2016-09-17 DIAGNOSIS — I5189 Other ill-defined heart diseases: Secondary | ICD-10-CM

## 2016-09-17 DIAGNOSIS — I519 Heart disease, unspecified: Secondary | ICD-10-CM

## 2016-09-17 LAB — HEMOGLOBIN A1C
Hgb A1c MFr Bld: 5.8 % — ABNORMAL HIGH (ref 4.8–5.6)
Mean Plasma Glucose: 120 mg/dL

## 2016-09-17 MED ORDER — SENNOSIDES-DOCUSATE SODIUM 8.6-50 MG PO TABS: 1.0000 | ORAL_TABLET | Freq: Every evening | ORAL | Status: DC | PRN

## 2016-09-17 MED ORDER — ATORVASTATIN CALCIUM 80 MG PO TABS: 80.0000 mg | ORAL_TABLET | Freq: Every day | ORAL | Status: DC

## 2016-09-17 MED ORDER — ASPIRIN 325 MG PO TABS: 325.0000 mg | ORAL_TABLET | Freq: Every day | ORAL | Status: AC

## 2016-09-17 NOTE — Progress Notes (Signed)
Patient is discharged from room 5M16 at this time. Alert and in stable condition. IV site d/c'd as well as tele. Report given to receiving nurse Colletta [place] at [facility] with all questions answered. Transported out of unit via stretcher by [facility] with all belongings at side.

## 2016-09-17 NOTE — Progress Notes (Signed)
Physical Therapy Treatment Patient Details Name: Randall Mccall MRN: [id number] DOB: 04/01/1971 Today's Date: 09/17/2016    History of Present Illness 46 y.o. male admitted to [facility] on 09/15/16 for right face, arm and leg weakness. Dx with subacute L lacunar infarct via CT scan.  Pt with significant PMHx of gout, learning disability, HTN, and knee surgery.      PT Comments    Pt progressing towards physical therapy goals. Was able to perform transfers with +2 mod assist for balance support and safety. BP near top of range allowed by physician, so did not progress gait training this session. Pt was educated on positioning of R side and general safety. Will continue to follow.   Follow Up Recommendations  CIR     Equipment Recommendations  Other (comment) (TBD by next venue of care)    Recommendations for Other Services Rehab consult     Precautions / Restrictions Precautions Precautions: Fall Restrictions Weight Bearing Restrictions: No    Mobility  Bed Mobility Overal bed mobility: Needs Assistance Bed Mobility: Rolling;Sidelying to Sit Rolling: Min guard Sidelying to sit: Mod assist       General bed mobility comments: VC's for sequencing and technique. Pt was able to advance LE's towards EOB however required assist to elevate trunk to full sitting position. Heavy use of rails required.   Transfers Overall transfer level: Needs assistance Equipment used: 2 person hand held assist Transfers: Sit to/from [facility] Sit to Stand: Mod assist;+2 physical assistance Stand pivot transfers: Mod assist;+2 physical assistance       General transfer comment: Pt was able to transition from bed to [facility], and BSC to recliner SPT to the L side. +2 assist was required for safety and maintenance of RUE.   Ambulation/Gait             General Gait Details: Did not progress gait due to increased BP after SPT.    Stairs            Wheelchair Mobility     Modified Rankin (Stroke Patients Only) Modified Rankin (Stroke Patients Only) Pre-Morbid Rankin Score: No symptoms Modified Rankin: Severe disability     Balance Overall balance assessment: Needs assistance Sitting-balance support: Feet supported;Single extremity supported Sitting balance-Leahy Scale: Fair Sitting balance - Comments: supervision EOB once feet supported under him and left upper extremity proped.                             Cognition Arousal/Alertness: Awake/alert Behavior During Therapy: WFL for tasks assessed/performed Overall Cognitive Status: History of cognitive impairments - at baseline (some learning disability at baseline per chart)                      Exercises      General Comments General comments (skin integrity, edema, etc.): BP after transfer to [facility] - 159/118; BP after transfer BSC>recliner - 170/115. Pt with some right sided inattention.       Pertinent Vitals/Pain Pain Assessment: No/denies pain    Home Living                      Prior Function            PT Goals (current goals can now be found in the care plan section) Acute Rehab PT Goals Patient Stated Goal: did not state today PT Goal Formulation: With patient Time For Goal Achievement: 09/30/16  Potential to Achieve Goals: Good Progress towards PT goals: Progressing toward goals    Frequency    Min 4X/week      PT Plan Current plan remains appropriate    Co-evaluation             End of Session Equipment Utilized During Treatment: Gait belt Activity Tolerance: Patient tolerated treatment well (Limited progression of ambulation due to BP near ceiling) Patient left: in chair;with call bell/phone within reach;with chair alarm set     Time: [id number] PT Time Calculation (min) (ACUTE ONLY): 25 min  Charges:  $Gait Training: 23-37 mins                    G Codes:      Thelma Comp 09-27-16, 11:57 AM   Rolinda Roan, PT,  DPT Acute Rehabilitation Services Pager: [phone]

## 2016-09-17 NOTE — Progress Notes (Addendum)
Patient will discharge to [place] Anticipated discharge date: 1/22 Family notified: pt to notified Transportation by [facility]- scheduled for 5pm Report #: [phone]  [place] signing off.  Jorge Ny, LCSW Clinical Social Worker [phone]

## 2016-09-17 NOTE — Consult Note (Addendum)
Physical Medicine and Rehabilitation Consult Reason for Consult: Acute infarct left posterior limb of internal capsule Referring Physician: [facility]   HPI: Randall Mccall is a 46 y.o. right handed male with history of hypertension and gout. Per chart review and patient, patient lives with brother independent prior to admission. Brother works during the day. One level home with 3 steps to entry. Presented 09/15/2016 with right sided weakness and slurred speech. Cranial CT scan, reviewed, showing acute/subacute left lenticulostriate lacunar infarct. Patient did not receive TPA. MRI of the brain subcentimeter focus acute infarct left posterior limb of internal capsule. No acute hemorrhage identified. MRA with no evidence of high-grade stenosis occlusion or aneurysm. CT angiogram of head and neck showed moderate right cavernous ICA stenosis. High-grade atherosclerotic stenosis versus nonocclusive thrombus at the left MCA bifurcation predominantly involving the M2 superior division. Echocardiogram with ejection fraction [id number] grade 1 diastolic dysfunction. UDS positive for cocaine. Spoke to Neurology. Currently maintained on aspirin and Plavix  for CVA prophylaxis. Subcutaneous Lovenox for DVT prophylaxis. Tolerating a regular consistency diet. Physical occupational therapy evaluations completed with recommendations of physical medicine rehabilitation consult.   Review of Systems  Constitutional: Negative for chills and fever.  HENT: Negative for hearing loss and tinnitus.   Eyes: Negative for blurred vision and double vision.  Respiratory: Negative for cough and shortness of breath.   Cardiovascular: Negative for chest pain, palpitations and leg swelling.  Gastrointestinal: Positive for constipation. Negative for nausea and vomiting.  Genitourinary: Negative for dysuria, flank pain and hematuria.  Musculoskeletal: Positive for joint pain and myalgias.  Skin: Negative for rash.  Neurological:  Positive for speech change, focal weakness and weakness. Negative for sensory change and seizures.       Intermittent headaches  All other systems reviewed and are negative.  Past Medical History:  Diagnosis Date  . Gout   . Hypertension   . Muscle weakness    Past Surgical History:  Procedure Laterality Date  . KNEE SURGERY     History reviewed. No pertinent family history of premature stroke. Social History:  reports that he has never smoked. He has never used smokeless tobacco. He reports that he drinks about 0.6 oz of alcohol per week . He reports that he does not use drugs. Allergies: No Known Allergies Medications Prior to Admission  Medication Sig Dispense Refill  . allopurinol (ZYLOPRIM) 100 MG tablet Take 100 mg by mouth daily.    Marland Kitchen ibuprofen (ADVIL,MOTRIN) 200 MG tablet Take 400 mg by mouth every 6 (six) hours as needed for headache, mild pain or moderate pain.    Marland Kitchen amLODipine (NORVASC) 10 MG tablet Take 1 tablet (10 mg total) by mouth daily. (Patient not taking: Reported on 09/15/2016) 30 tablet 1  . amLODipine (NORVASC) 10 MG tablet Take 1 tablet (10 mg total) by mouth daily. 30 tablet 0  . cloNIDine (CATAPRES) 0.1 MG tablet Take 1 tablet (0.1 mg total) by mouth 2 (two) times daily. (Patient not taking: Reported on 09/15/2016) 20 tablet 0  . cloNIDine (CATAPRES) 0.1 MG tablet Take 1 tablet (0.1 mg total) by mouth 2 (two) times daily. 30 tablet 0  . colchicine 0.6 MG tablet Take 1 tablet (0.6 mg total) by mouth daily. Until symptoms resolve (Patient not taking: Reported on 09/15/2016) 30 tablet 0  . oxyCODONE-acetaminophen (PERCOCET/ROXICET) 5-325 MG tablet Take 1 tablet by mouth every 8 (eight) hours as needed for severe pain. (Patient not taking: Reported on 09/15/2016) 5 tablet 0  Home: Home Living Family/patient expects to be discharged to:: Inpatient rehab Living Arrangements: Other relatives, Other (Comment) (brother) Available Help at Discharge: Family Type of Home:  House Home Access: Stairs to enter Technical brewer of Steps: 3 Entrance Stairs-Rails: Right Home Layout: One level Bathroom Shower/Tub: Gaffer, Architectural technologist: Standard Home Equipment: None  Lives With: Other (Comment) (Brother)  Functional History: Prior Function Level of Independence: Independent Comments: has not worked in 3 years. Functional Status:  Mobility: Bed Mobility Overal bed mobility: Needs Assistance Bed Mobility: Rolling, Sidelying to Sit, Sit to Sidelying Rolling: Min guard Sidelying to sit: Mod assist, +2 for physical assistance Sit to sidelying: Min guard General bed mobility comments: Cues for technique to roll to his right side.  Mod assist to support trunk during transition to sit EOB.  Pt able to get back in the bed with min guard assist only to help ensure his right leg got back into the bed, but gravity and momentum were used to complete task. Transfers General transfer comment: NT due to BP outside of MD's parameters.       ADL: ADL Overall ADL's : Needs assistance/impaired Eating/Feeding: Set up, Sitting, Bed level Eating/Feeding Details (indicate cue type and reason): has to use non-dominant arm and has tremors in his hand as well (pt reports these are not new) Grooming: Wash/dry face, Sitting Upper Body Bathing: Minimal assistance, Sitting Lower Body Bathing: Maximal assistance, Sitting/lateral leans Upper Body Dressing : Maximal assistance, Sitting Lower Body Dressing: Total assistance, Sitting/lateral leans  Cognition: Cognition Overall Cognitive Status: History of cognitive impairments - at baseline Arousal/Alertness: Awake/alert Orientation Level: Oriented X4 Attention: Sustained Sustained Attention: Impaired Sustained Attention Impairment: Verbal basic, Functional basic Memory: Impaired Memory Impairment: Decreased recall of new information Awareness: Appears intact Problem Solving: Appears intact Executive  Function: Sequencing Sequencing: Impaired Sequencing Impairment: Verbal basic Safety/Judgment: Appears intact Cognition Arousal/Alertness: Awake/alert Behavior During Therapy: WFL for tasks assessed/performed Overall Cognitive Status: History of cognitive impairments - at baseline  Blood pressure (!) 176/120, pulse 66, temperature 97.8 F (36.6 C), temperature source Oral, resp. rate 20, height 6\' 1"  (1.854 m), weight 110.9 kg (244 lb 9.6 oz), SpO2 99 %. Physical Exam  Vitals reviewed. Constitutional: He appears well-developed.  Obese  HENT:  Head: Normocephalic and atraumatic.  Mild right eye ptosis Poor dentition  Eyes: Right eye exhibits no discharge. Left eye exhibits no discharge.  Pupils reactive to light  Neck: Normal range of motion. Neck supple. No thyromegaly present.  Cardiovascular: Normal rate, regular rhythm and normal heart sounds.   Respiratory: Effort normal and breath sounds normal. No respiratory distress.  GI: Soft. Bowel sounds are normal. He exhibits no distension.  Musculoskeletal: He exhibits no edema or tenderness.  Neurological: He is alert.  He makes good eye contact with examiner.  Follows simple commands.  Oriented to person place and date of birth.  Fair awareness of deficits Right facial weakness Motor: RUE: 0/5 proximal to distal RLE: 4/5 proximal to distal LUE/LLE: 5/5 proximal to distal Sensation intact to light touch DTRs 3+ RUE  Skin: Skin is warm and dry.  Psychiatric: He has a normal mood and affect. His behavior is normal.    Results for orders placed or performed during the hospital encounter of 09/15/16 (from the past 24 hour(s))  Urine rapid drug screen (hosp performed)     Status: Abnormal   Collection Time: 09/16/16  4:45 PM  Result Value Ref Range   Opiates NONE DETECTED NONE DETECTED  Cocaine POSITIVE (A) NONE DETECTED   Benzodiazepines NONE DETECTED NONE DETECTED   Amphetamines NONE DETECTED NONE DETECTED    Tetrahydrocannabinol NONE DETECTED NONE DETECTED   Barbiturates NONE DETECTED NONE DETECTED   Ct Angio Head W Or Wo Contrast  Result Date: 09/15/2016 CLINICAL DATA:  Right facial droop, right arm weakness, and slurred speech. EXAM: CT ANGIOGRAPHY HEAD AND NECK TECHNIQUE: Multidetector CT imaging of the head and neck was performed using the standard protocol during bolus administration of intravenous contrast. Multiplanar CT image reconstructions and MIPs were obtained to evaluate the vascular anatomy. Carotid stenosis measurements (when applicable) are obtained utilizing NASCET criteria, using the distal internal carotid diameter as the denominator. CONTRAST:  50 mL Isovue 370 COMPARISON:  None. FINDINGS: CTA NECK FINDINGS Aortic arch: 3 vessel aortic arch. Widely patent brachiocephalic and subclavian arteries. Right carotid system: Patent without evidence of stenosis or dissection. Minimal soft plaque at the carotid bifurcation. Left carotid system: Patent without evidence of stenosis or dissection. Mild, predominantly soft plaque at the carotid bifurcation. Vertebral arteries: Patent without evidence of stenosis or dissection. Left vertebral artery is strongly dominant. Skeleton: Mild cervical spondylosis. Other neck: No mass or lymph node enlargement. Upper chest: Mild motion artifact through the lung apices with dependent subsegmental atelectasis. Review of the MIP images confirms the above findings CTA HEAD FINDINGS Anterior circulation: The internal carotid arteries are patent from skullbase to carotid termini with bilateral siphon atherosclerosis. There is moderate right anterior cavernous segment stenosis. The MCAs are patent with an early bifurcation noted on the left. There is nonocclusive thrombus versus atherosclerotic narrowing involving the left MCA bifurcation, predominantly the superior M2 division which is severely narrowed and thready in appearance over a 6 mm length. There is moderate  stenosis of the proximal M2 inferior division. The right MCA and both ACAs are patent without evidence of major branch occlusion or significant proximal stenosis. Mild diffuse branch vessel irregularity is present. The left A1 segment is dominant. No aneurysm. Posterior circulation: Intracranial left vertebral artery is widely patent and supplies the basilar. The right vertebral artery ends in PICA. Left PICA is also patent. The basilar artery is patent and tortuous with mild proximal and mid segment stenoses. SCA origins are patent. There is a patent left posterior communicating artery with hypoplastic left P1 segment. No significant PCA stenosis is identified. No aneurysm. Venous sinuses: Grossly patent within limitations of arterial phase dominant contrast timing. Anatomic variants: None of significance. Review of the MIP images confirms the above findings IMPRESSION: 1. High-grade atherosclerotic stenosis versus nonocclusive thrombus at the left MCA bifurcation, predominantly involving the M2 superior division. 2. Moderate right cavernous ICA stenosis. 3. No cervical carotid artery or vertebral artery stenosis. These results were called by telephone at the time of interpretation on 09/15/2016 at 9:35 pm to Dr. Roland Rack , who verbally acknowledged these results. Electronically Signed   By: Logan Bores M.D.   On: 09/15/2016 21:52   Ct Angio Neck W Or Wo Contrast  Result Date: 09/15/2016 CLINICAL DATA:  Right facial droop, right arm weakness, and slurred speech. EXAM: CT ANGIOGRAPHY HEAD AND NECK TECHNIQUE: Multidetector CT imaging of the head and neck was performed using the standard protocol during bolus administration of intravenous contrast. Multiplanar CT image reconstructions and MIPs were obtained to evaluate the vascular anatomy. Carotid stenosis measurements (when applicable) are obtained utilizing NASCET criteria, using the distal internal carotid diameter as the denominator. CONTRAST:  50  mL Isovue 370 COMPARISON:  None. FINDINGS:  CTA NECK FINDINGS Aortic arch: 3 vessel aortic arch. Widely patent brachiocephalic and subclavian arteries. Right carotid system: Patent without evidence of stenosis or dissection. Minimal soft plaque at the carotid bifurcation. Left carotid system: Patent without evidence of stenosis or dissection. Mild, predominantly soft plaque at the carotid bifurcation. Vertebral arteries: Patent without evidence of stenosis or dissection. Left vertebral artery is strongly dominant. Skeleton: Mild cervical spondylosis. Other neck: No mass or lymph node enlargement. Upper chest: Mild motion artifact through the lung apices with dependent subsegmental atelectasis. Review of the MIP images confirms the above findings CTA HEAD FINDINGS Anterior circulation: The internal carotid arteries are patent from skullbase to carotid termini with bilateral siphon atherosclerosis. There is moderate right anterior cavernous segment stenosis. The MCAs are patent with an early bifurcation noted on the left. There is nonocclusive thrombus versus atherosclerotic narrowing involving the left MCA bifurcation, predominantly the superior M2 division which is severely narrowed and thready in appearance over a 6 mm length. There is moderate stenosis of the proximal M2 inferior division. The right MCA and both ACAs are patent without evidence of major branch occlusion or significant proximal stenosis. Mild diffuse branch vessel irregularity is present. The left A1 segment is dominant. No aneurysm. Posterior circulation: Intracranial left vertebral artery is widely patent and supplies the basilar. The right vertebral artery ends in PICA. Left PICA is also patent. The basilar artery is patent and tortuous with mild proximal and mid segment stenoses. SCA origins are patent. There is a patent left posterior communicating artery with hypoplastic left P1 segment. No significant PCA stenosis is identified. No aneurysm.  Venous sinuses: Grossly patent within limitations of arterial phase dominant contrast timing. Anatomic variants: None of significance. Review of the MIP images confirms the above findings IMPRESSION: 1. High-grade atherosclerotic stenosis versus nonocclusive thrombus at the left MCA bifurcation, predominantly involving the M2 superior division. 2. Moderate right cavernous ICA stenosis. 3. No cervical carotid artery or vertebral artery stenosis. These results were called by telephone at the time of interpretation on 09/15/2016 at 9:35 pm to Dr. Roland Rack , who verbally acknowledged these results. Electronically Signed   By: Logan Bores M.D.   On: 09/15/2016 21:52   Mr Brain Wo Contrast  Result Date: 09/16/2016 CLINICAL DATA:  46 y/o  M; right upper extremity weakness. EXAM: MRI HEAD WITHOUT CONTRAST MRA HEAD WITHOUT CONTRAST TECHNIQUE: Multiplanar, multiecho pulse sequences of the brain and surrounding structures were obtained without intravenous contrast. Angiographic images of the head were obtained using MRA technique without contrast. COMPARISON:  09/15/2016 CT of the head and CT angiogram head and neck. FINDINGS: MRI HEAD FINDINGS Brain: 9 x 5 mm focus of diffusion restriction consistent with acute infarct within the left posterior limb of internal capsule. Foci of T2 FLAIR hyperintense signal abnormality in subcortical and periventricular white matter spine nonspecific but probably represent advanced chronic microvascular ischemic changes for patient's age. Chronic hemosiderin stained lacunar infarct within the left caudate head. Vascular: As below. Skull and upper cervical spine: Normal marrow signal. Sinuses/Orbits: Negative. Other: None. MRA HEAD FINDINGS Internal carotid arteries:  Patent. Anterior cerebral arteries:  Patent. Middle cerebral arteries: Patent. Mild-to-moderate stenosis of left M2 superior division origin (series 951, image 11). The segment of stenosis is less conspicuous when  compared with the prior CTA of the head. Anterior communicating artery: Patent. Posterior communicating arteries: Patent large left and small right. Posterior cerebral arteries:  Patent. Basilar artery: Mild proximal stenosis. Tortuous course with large rightward loop. Vertebral  arteries:  Patent bilaterally. No evidence of high-grade stenosis, large vessel occlusion, or aneurysm unless noted above. IMPRESSION: 1. Subcentimeter focus of acute infarction within the left posterior limb of internal capsule. No acute hemorrhage identified. 2. Nonspecific foci of T2 FLAIR hyperintense signal abnormality in white matter probably represents advanced chronic microvascular ischemic changes for patient's age. Chronic lacunar infarct within the left caudate head. 3. Patent circle of Willis without evidence for high-grade stenosis, large vessel occlusion or aneurysm. 4. Long segment of mild-to-moderate stenosis of left M2 prox superior division, less conspicuous when compared with prior CT angiogram of the head. Electronically Signed   By: Kristine Garbe M.D.   On: 09/16/2016 01:13   Mr Jodene Nam Head/brain [id number] Cm  Result Date: 09/16/2016 CLINICAL DATA:  46 y/o  M; right upper extremity weakness. EXAM: MRI HEAD WITHOUT CONTRAST MRA HEAD WITHOUT CONTRAST TECHNIQUE: Multiplanar, multiecho pulse sequences of the brain and surrounding structures were obtained without intravenous contrast. Angiographic images of the head were obtained using MRA technique without contrast. COMPARISON:  09/15/2016 CT of the head and CT angiogram head and neck. FINDINGS: MRI HEAD FINDINGS Brain: 9 x 5 mm focus of diffusion restriction consistent with acute infarct within the left posterior limb of internal capsule. Foci of T2 FLAIR hyperintense signal abnormality in subcortical and periventricular white matter spine nonspecific but probably represent advanced chronic microvascular ischemic changes for patient's age. Chronic hemosiderin stained  lacunar infarct within the left caudate head. Vascular: As below. Skull and upper cervical spine: Normal marrow signal. Sinuses/Orbits: Negative. Other: None. MRA HEAD FINDINGS Internal carotid arteries:  Patent. Anterior cerebral arteries:  Patent. Middle cerebral arteries: Patent. Mild-to-moderate stenosis of left M2 superior division origin (series 951, image 11). The segment of stenosis is less conspicuous when compared with the prior CTA of the head. Anterior communicating artery: Patent. Posterior communicating arteries: Patent large left and small right. Posterior cerebral arteries:  Patent. Basilar artery: Mild proximal stenosis. Tortuous course with large rightward loop. Vertebral arteries:  Patent bilaterally. No evidence of high-grade stenosis, large vessel occlusion, or aneurysm unless noted above. IMPRESSION: 1. Subcentimeter focus of acute infarction within the left posterior limb of internal capsule. No acute hemorrhage identified. 2. Nonspecific foci of T2 FLAIR hyperintense signal abnormality in white matter probably represents advanced chronic microvascular ischemic changes for patient's age. Chronic lacunar infarct within the left caudate head. 3. Patent circle of Willis without evidence for high-grade stenosis, large vessel occlusion or aneurysm. 4. Long segment of mild-to-moderate stenosis of left M2 prox superior division, less conspicuous when compared with prior CT angiogram of the head. Electronically Signed   By: Kristine Garbe M.D.   On: 09/16/2016 01:13   Ct Head Code Stroke W/o Cm  Result Date: 09/15/2016 CLINICAL DATA:  Code stroke. RIGHT facial droop. History of hypertension. EXAM: CT HEAD WITHOUT CONTRAST TECHNIQUE: Contiguous axial images were obtained from the base of the skull through the vertex without intravenous contrast. COMPARISON:  None. FINDINGS: BRAIN: LEFT caudate and to lesser extent putaminal low-density infarct, without mass effect nor ex vacuo dilatation  subjacent ventricle. Ventricles and sulci are overall normal for patient's age. No intraparenchymal hemorrhage, mass effect, midline shift or acute large vascular territory infarct. Patchy white matter supratentorial hypodensities. No abnormal extra-axial fluid collections. Basal cisterns are patent. VASCULAR: Mild calcific atherosclerosis of the carotid siphons. SKULL/SOFT TISSUES: No skull fracture. No significant soft tissue swelling. ORBITS/SINUSES: The included ocular globes and orbital contents are normal.The mastoid aircells and included paranasal sinuses  are well-aerated. OTHER: None. ASPECTS [facility] Stroke Program Early CT Score) - Ganglionic level infarction (caudate, lentiform nuclei, internal capsule, insula, M1-M3 cortex): 6 - Supraganglionic infarction (M4-M6 cortex): 3 Total score (0-10 with 10 being normal): 10 IMPRESSION: 1. Acute to subacute appearing LEFT lenticulostriate lacunar infarct. Mild atherosclerosis. Mild to moderate white matter changes most consistent with chronic small vessel ischemic disease, advanced for age. 2. ASPECTS is 9. Critical Value/emergent results were called by telephone at the time of interpretation on 09/15/2016 at 8:45 pm to Dr. Leonel Ramsay, Neurology, who verbally acknowledged these results. Electronically Signed   By: Elon Alas M.D.   On: 09/15/2016 20:48    Assessment/Plan: Diagnosis: Acute infarct left posterior limb of internal capsule Labs and images independently reviewed.  Records reviewed and summated above. Stroke: Continue secondary stroke prophylaxis and Risk Factor Modification listed below:   Antiplatelet therapy:   Blood Pressure Management:  Continue current medication with prn's with permisive HTN per primary team Statin Agent:   Prediabetes management:   Right sided hemiparesis: fit for orthosis to prevent contractures (resting hand splint for day, wrist cock up splint at night, PRAFO, etc) Motor recovery: Fluoxetine  1. Does  the need for close, 24 hr/day medical supervision in concert with the patient's rehab needs make it unreasonable for this patient to be served in a less intensive setting? Yes  2. Co-Morbidities requiring supervision/potential complications: HTN (monitor and provide prns in accordance with increased physical exertion and pain), gout (monitor for flare, ensure pain does not limit therapies), diastolic dysfunction (monitor for signs/symptoms of fluid overload), cocaine abuse (counsel), CKD (avoid nephrotoxic meds) 3. Due to safety, disease management and patient education, does the patient require 24 hr/day rehab nursing? Yes 4. Does the patient require coordinated care of a physician, rehab nurse, PT (1-2 hrs/day, 5 days/week) and OT (1-2 hrs/day, 5 days/week) to address physical and functional deficits in the context of the above medical diagnosis(es)? Yes Addressing deficits in the following areas: balance, endurance, locomotion, strength, transferring, bathing, dressing, toileting, cognition, speech and psychosocial support 5. Can the patient actively participate in an intensive therapy program of at least 3 hrs of therapy per day at least 5 days per week? Yes 6. The potential for patient to make measurable gains while on inpatient rehab is excellent 7. Anticipated functional outcomes upon discharge from inpatient rehab are supervision and min assist  with PT, supervision and min assist with OT, modified independent and supervision with SLP. 8. Estimated rehab length of stay to reach the above functional goals is: 16-19 days. 9. Does the patient have adequate social supports and living environment to accommodate these discharge functional goals? Potentially 10. Anticipated D/C setting: Home 11. Anticipated post D/C treatments: HH therapy and Home excercise program 12. Overall Rehab/Functional Prognosis: good  RECOMMENDATIONS: This patient's condition is appropriate for continued rehabilitative care  in the following setting: CIR Patient has agreed to participate in recommended program. Yes Note that insurance prior authorization may be required for reimbursement for recommended care.  Comment: Rehab Admissions Coordinator to follow up.  Delice Lesch, MD, Mellody Drown Cathlyn Parsons., PA-C 09/17/2016

## 2016-09-17 NOTE — Discharge Summary (Signed)
Physician Discharge Summary  Randall Mccall [id number] DOB: 08-16-1971 DOA: 09/15/2016  PCP: Campbell Lerner, MD  Admit date: 09/15/2016 Discharge date: 09/17/2016  Time spent: 30 minutes  Recommendations for Outpatient Follow-up:  1. rec cocaine cessation 2. Need cmet and cbc 1 week 3. Asa 325 daily going forward 4. Likely dc to CIR  Discharge Diagnoses:  Principal Problem:   Acute ischemic stroke [facility]) Active Problems:   Stroke (cerebrum) ([place])   Hypertension   Discharge Condition: imporved   Diet recommendation:  hh  Filed Weights   09/16/16 0114  Weight: 110.9 kg (244 lb 9.6 oz)    History of present illness:  46 year old male Known history of learning disability Hypertension Cocaine use Gout with recent treatment in the emergency The past has been for abnormal EMGs and possible myopathy  Patient had acute right facial droop and right arm weakness around 1 PM 1/20 and CT showed subacute lacunar infarct left side Neurology was consulted  Hospital Course:  acute CVA-echocardiogram, Plavix + aspirin for now. Total cholesterol 235 LDL 159 so placed on high intensity statin Lipitor 80. MRI acute lacunar cva Echo ef [id number] diastolic dys [id number] 5.8 and need OP diet modif Hypertension-allow permissive hypertension with labetalol every 2 when necessary for systolics greater than 123456.  Right now holding off of Norvasc 10 and on clonidine 0.1 bid--resumed on d/c Gout reorder allopurinol 100 daily --reseumed on d/c colchicine  ckd 1-2-stable.  Improved on d/c Learning disability-only moderate Prior myopathy followed by Dr. Arna Snipe as out patient  Consultations:  Neurology CIR  Discharge Exam: Vitals:   09/17/16 0107 09/17/16 0610  BP: (!) 166/111 (!) 176/120  Pulse: 63 66  Resp: 20 20  Temp: 98 F (36.7 C) 97.8 F (36.6 C)    General:  Alert pleasant, dense R UE weakness Cardiovascular:  s1 s2 no m/r/g Respiratory: clear Weak R ue , twisting of mouth  noted  Discharge Instructions    Current Discharge Medication List    START taking these medications   Details  aspirin 325 MG tablet Take 1 tablet (325 mg total) by mouth daily.    atorvastatin (LIPITOR) 80 MG tablet Take 1 tablet (80 mg total) by mouth daily at 6 PM.    senna-docusate (SENOKOT-S) 8.6-50 MG tablet Take 1 tablet by mouth at bedtime as needed for mild constipation.      CONTINUE these medications which have NOT CHANGED   Details  allopurinol (ZYLOPRIM) 100 MG tablet Take 100 mg by mouth daily.    amLODipine (NORVASC) 10 MG tablet Take 1 tablet (10 mg total) by mouth daily. Qty: 30 tablet, Refills: 1    cloNIDine (CATAPRES) 0.1 MG tablet Take 1 tablet (0.1 mg total) by mouth 2 (two) times daily. Qty: 20 tablet, Refills: 0    colchicine 0.6 MG tablet Take 1 tablet (0.6 mg total) by mouth daily. Until symptoms resolve Qty: 30 tablet, Refills: 0    oxyCODONE-acetaminophen (PERCOCET/ROXICET) 5-325 MG tablet Take 1 tablet by mouth every 8 (eight) hours as needed for severe pain. Qty: 5 tablet, Refills: 0      STOP taking these medications     ibuprofen (ADVIL,MOTRIN) 200 MG tablet        No Known Allergies    The results of significant diagnostics from this hospitalization (including imaging, microbiology, ancillary and laboratory) are listed below for reference.    Significant Diagnostic Studies: Ct Angio Head W Or Wo Contrast  Result Date: 09/15/2016 CLINICAL DATA:  Right facial droop, right arm weakness, and slurred speech. EXAM: CT ANGIOGRAPHY HEAD AND NECK TECHNIQUE: Multidetector CT imaging of the head and neck was performed using the standard protocol during bolus administration of intravenous contrast. Multiplanar CT image reconstructions and MIPs were obtained to evaluate the vascular anatomy. Carotid stenosis measurements (when applicable) are obtained utilizing NASCET criteria, using the distal internal carotid diameter as the denominator.  CONTRAST:  50 mL Isovue 370 COMPARISON:  None. FINDINGS: CTA NECK FINDINGS Aortic arch: 3 vessel aortic arch. Widely patent brachiocephalic and subclavian arteries. Right carotid system: Patent without evidence of stenosis or dissection. Minimal soft plaque at the carotid bifurcation. Left carotid system: Patent without evidence of stenosis or dissection. Mild, predominantly soft plaque at the carotid bifurcation. Vertebral arteries: Patent without evidence of stenosis or dissection. Left vertebral artery is strongly dominant. Skeleton: Mild cervical spondylosis. Other neck: No mass or lymph node enlargement. Upper chest: Mild motion artifact through the lung apices with dependent subsegmental atelectasis. Review of the MIP images confirms the above findings CTA HEAD FINDINGS Anterior circulation: The internal carotid arteries are patent from skullbase to carotid termini with bilateral siphon atherosclerosis. There is moderate right anterior cavernous segment stenosis. The MCAs are patent with an early bifurcation noted on the left. There is nonocclusive thrombus versus atherosclerotic narrowing involving the left MCA bifurcation, predominantly the superior M2 division which is severely narrowed and thready in appearance over a 6 mm length. There is moderate stenosis of the proximal M2 inferior division. The right MCA and both ACAs are patent without evidence of major branch occlusion or significant proximal stenosis. Mild diffuse branch vessel irregularity is present. The left A1 segment is dominant. No aneurysm. Posterior circulation: Intracranial left vertebral artery is widely patent and supplies the basilar. The right vertebral artery ends in PICA. Left PICA is also patent. The basilar artery is patent and tortuous with mild proximal and mid segment stenoses. SCA origins are patent. There is a patent left posterior communicating artery with hypoplastic left P1 segment. No significant PCA stenosis is identified.  No aneurysm. Venous sinuses: Grossly patent within limitations of arterial phase dominant contrast timing. Anatomic variants: None of significance. Review of the MIP images confirms the above findings IMPRESSION: 1. High-grade atherosclerotic stenosis versus nonocclusive thrombus at the left MCA bifurcation, predominantly involving the M2 superior division. 2. Moderate right cavernous ICA stenosis. 3. No cervical carotid artery or vertebral artery stenosis. These results were called by telephone at the time of interpretation on 09/15/2016 at 9:35 pm to Dr. Roland Rack , who verbally acknowledged these results. Electronically Signed   By: Logan Bores M.D.   On: 09/15/2016 21:52   Ct Angio Neck W Or Wo Contrast  Result Date: 09/15/2016 CLINICAL DATA:  Right facial droop, right arm weakness, and slurred speech. EXAM: CT ANGIOGRAPHY HEAD AND NECK TECHNIQUE: Multidetector CT imaging of the head and neck was performed using the standard protocol during bolus administration of intravenous contrast. Multiplanar CT image reconstructions and MIPs were obtained to evaluate the vascular anatomy. Carotid stenosis measurements (when applicable) are obtained utilizing NASCET criteria, using the distal internal carotid diameter as the denominator. CONTRAST:  50 mL Isovue 370 COMPARISON:  None. FINDINGS: CTA NECK FINDINGS Aortic arch: 3 vessel aortic arch. Widely patent brachiocephalic and subclavian arteries. Right carotid system: Patent without evidence of stenosis or dissection. Minimal soft plaque at the carotid bifurcation. Left carotid system: Patent without evidence of stenosis or dissection. Mild, predominantly soft plaque at the carotid bifurcation.  Vertebral arteries: Patent without evidence of stenosis or dissection. Left vertebral artery is strongly dominant. Skeleton: Mild cervical spondylosis. Other neck: No mass or lymph node enlargement. Upper chest: Mild motion artifact through the lung apices with  dependent subsegmental atelectasis. Review of the MIP images confirms the above findings CTA HEAD FINDINGS Anterior circulation: The internal carotid arteries are patent from skullbase to carotid termini with bilateral siphon atherosclerosis. There is moderate right anterior cavernous segment stenosis. The MCAs are patent with an early bifurcation noted on the left. There is nonocclusive thrombus versus atherosclerotic narrowing involving the left MCA bifurcation, predominantly the superior M2 division which is severely narrowed and thready in appearance over a 6 mm length. There is moderate stenosis of the proximal M2 inferior division. The right MCA and both ACAs are patent without evidence of major branch occlusion or significant proximal stenosis. Mild diffuse branch vessel irregularity is present. The left A1 segment is dominant. No aneurysm. Posterior circulation: Intracranial left vertebral artery is widely patent and supplies the basilar. The right vertebral artery ends in PICA. Left PICA is also patent. The basilar artery is patent and tortuous with mild proximal and mid segment stenoses. SCA origins are patent. There is a patent left posterior communicating artery with hypoplastic left P1 segment. No significant PCA stenosis is identified. No aneurysm. Venous sinuses: Grossly patent within limitations of arterial phase dominant contrast timing. Anatomic variants: None of significance. Review of the MIP images confirms the above findings IMPRESSION: 1. High-grade atherosclerotic stenosis versus nonocclusive thrombus at the left MCA bifurcation, predominantly involving the M2 superior division. 2. Moderate right cavernous ICA stenosis. 3. No cervical carotid artery or vertebral artery stenosis. These results were called by telephone at the time of interpretation on 09/15/2016 at 9:35 pm to Dr. Roland Rack , who verbally acknowledged these results. Electronically Signed   By: Logan Bores M.D.   On:  09/15/2016 21:52   Mr Brain Wo Contrast  Result Date: 09/16/2016 CLINICAL DATA:  46 y/o  M; right upper extremity weakness. EXAM: MRI HEAD WITHOUT CONTRAST MRA HEAD WITHOUT CONTRAST TECHNIQUE: Multiplanar, multiecho pulse sequences of the brain and surrounding structures were obtained without intravenous contrast. Angiographic images of the head were obtained using MRA technique without contrast. COMPARISON:  09/15/2016 CT of the head and CT angiogram head and neck. FINDINGS: MRI HEAD FINDINGS Brain: 9 x 5 mm focus of diffusion restriction consistent with acute infarct within the left posterior limb of internal capsule. Foci of T2 FLAIR hyperintense signal abnormality in subcortical and periventricular white matter spine nonspecific but probably represent advanced chronic microvascular ischemic changes for patient's age. Chronic hemosiderin stained lacunar infarct within the left caudate head. Vascular: As below. Skull and upper cervical spine: Normal marrow signal. Sinuses/Orbits: Negative. Other: None. MRA HEAD FINDINGS Internal carotid arteries:  Patent. Anterior cerebral arteries:  Patent. Middle cerebral arteries: Patent. Mild-to-moderate stenosis of left M2 superior division origin (series 951, image 11). The segment of stenosis is less conspicuous when compared with the prior CTA of the head. Anterior communicating artery: Patent. Posterior communicating arteries: Patent large left and small right. Posterior cerebral arteries:  Patent. Basilar artery: Mild proximal stenosis. Tortuous course with large rightward loop. Vertebral arteries:  Patent bilaterally. No evidence of high-grade stenosis, large vessel occlusion, or aneurysm unless noted above. IMPRESSION: 1. Subcentimeter focus of acute infarction within the left posterior limb of internal capsule. No acute hemorrhage identified. 2. Nonspecific foci of T2 FLAIR hyperintense signal abnormality in white matter probably represents  advanced chronic  microvascular ischemic changes for patient's age. Chronic lacunar infarct within the left caudate head. 3. Patent circle of Willis without evidence for high-grade stenosis, large vessel occlusion or aneurysm. 4. Long segment of mild-to-moderate stenosis of left M2 prox superior division, less conspicuous when compared with prior CT angiogram of the head. Electronically Signed   By: Kristine Garbe M.D.   On: 09/16/2016 01:13   Mr Jodene Nam Head/brain [id number] Cm  Result Date: 09/16/2016 CLINICAL DATA:  46 y/o  M; right upper extremity weakness. EXAM: MRI HEAD WITHOUT CONTRAST MRA HEAD WITHOUT CONTRAST TECHNIQUE: Multiplanar, multiecho pulse sequences of the brain and surrounding structures were obtained without intravenous contrast. Angiographic images of the head were obtained using MRA technique without contrast. COMPARISON:  09/15/2016 CT of the head and CT angiogram head and neck. FINDINGS: MRI HEAD FINDINGS Brain: 9 x 5 mm focus of diffusion restriction consistent with acute infarct within the left posterior limb of internal capsule. Foci of T2 FLAIR hyperintense signal abnormality in subcortical and periventricular white matter spine nonspecific but probably represent advanced chronic microvascular ischemic changes for patient's age. Chronic hemosiderin stained lacunar infarct within the left caudate head. Vascular: As below. Skull and upper cervical spine: Normal marrow signal. Sinuses/Orbits: Negative. Other: None. MRA HEAD FINDINGS Internal carotid arteries:  Patent. Anterior cerebral arteries:  Patent. Middle cerebral arteries: Patent. Mild-to-moderate stenosis of left M2 superior division origin (series 951, image 11). The segment of stenosis is less conspicuous when compared with the prior CTA of the head. Anterior communicating artery: Patent. Posterior communicating arteries: Patent large left and small right. Posterior cerebral arteries:  Patent. Basilar artery: Mild proximal stenosis. Tortuous  course with large rightward loop. Vertebral arteries:  Patent bilaterally. No evidence of high-grade stenosis, large vessel occlusion, or aneurysm unless noted above. IMPRESSION: 1. Subcentimeter focus of acute infarction within the left posterior limb of internal capsule. No acute hemorrhage identified. 2. Nonspecific foci of T2 FLAIR hyperintense signal abnormality in white matter probably represents advanced chronic microvascular ischemic changes for patient's age. Chronic lacunar infarct within the left caudate head. 3. Patent circle of Willis without evidence for high-grade stenosis, large vessel occlusion or aneurysm. 4. Long segment of mild-to-moderate stenosis of left M2 prox superior division, less conspicuous when compared with prior CT angiogram of the head. Electronically Signed   By: Kristine Garbe M.D.   On: 09/16/2016 01:13   Ct Head Code Stroke W/o Cm  Result Date: 09/15/2016 CLINICAL DATA:  Code stroke. RIGHT facial droop. History of hypertension. EXAM: CT HEAD WITHOUT CONTRAST TECHNIQUE: Contiguous axial images were obtained from the base of the skull through the vertex without intravenous contrast. COMPARISON:  None. FINDINGS: BRAIN: LEFT caudate and to lesser extent putaminal low-density infarct, without mass effect nor ex vacuo dilatation subjacent ventricle. Ventricles and sulci are overall normal for patient's age. No intraparenchymal hemorrhage, mass effect, midline shift or acute large vascular territory infarct. Patchy white matter supratentorial hypodensities. No abnormal extra-axial fluid collections. Basal cisterns are patent. VASCULAR: Mild calcific atherosclerosis of the carotid siphons. SKULL/SOFT TISSUES: No skull fracture. No significant soft tissue swelling. ORBITS/SINUSES: The included ocular globes and orbital contents are normal.The mastoid aircells and included paranasal sinuses are well-aerated. OTHER: None. ASPECTS [facility] Stroke Program Early CT Score) -  Ganglionic level infarction (caudate, lentiform nuclei, internal capsule, insula, M1-M3 cortex): 6 - Supraganglionic infarction (M4-M6 cortex): 3 Total score (0-10 with 10 being normal): 10 IMPRESSION: 1. Acute to subacute appearing LEFT lenticulostriate lacunar infarct. Mild  atherosclerosis. Mild to moderate white matter changes most consistent with chronic small vessel ischemic disease, advanced for age. 2. ASPECTS is 9. Critical Value/emergent results were called by telephone at the time of interpretation on 09/15/2016 at 8:45 pm to Dr. Leonel Ramsay, Neurology, who verbally acknowledged these results. Electronically Signed   By: Elon Alas M.D.   On: 09/15/2016 20:48    Microbiology: No results found for this or any previous visit (from the past 240 hour(s)).   Labs: Basic Metabolic Panel:  Recent Labs Lab 09/15/16 2028 09/15/16 2039  NA 136 141  K 3.7 3.9  CL 102 104  CO2 20*  --   GLUCOSE 107* 108*  BUN 16 20  CREATININE 1.27* 1.30*  CALCIUM 9.5  --    Liver Function Tests:  Recent Labs Lab 09/15/16 2028  AST 37  ALT 21  ALKPHOS 85  BILITOT 0.5  PROT 8.0  ALBUMIN 4.5   No results for input(s): LIPASE, AMYLASE in the last 168 hours. No results for input(s): AMMONIA in the last 168 hours. CBC:  Recent Labs Lab 09/15/16 2028 09/15/16 2039  WBC 7.5  --   NEUTROABS 3.8  --   HGB 16.1 17.3*  HCT 47.9 51.0  MCV 91.8  --   PLT 313  --    Cardiac Enzymes: No results for input(s): CKTOTAL, CKMB, CKMBINDEX, TROPONINI in the last 168 hours. BNP: BNP (last 3 results) No results for input(s): BNP in the last 8760 hours.  ProBNP (last 3 results) No results for input(s): PROBNP in the last 8760 hours.  CBG: No results for input(s): GLUCAP in the last 168 hours.     SignedNita Sells MD   [facility] 09/17/2016, 10:55 AM

## 2016-09-17 NOTE — Care Management Note (Signed)
Case Management Note  Patient Details  Name: KAGE DULL MRN: [id number] Date of Birth: 1971/04/24  Subjective/Objective:       Patient was admitted with CVA. Lives with brother. CM will follow for discharge needs pending PT/OT evals and physician orders.              Action/Plan:   Expected Discharge Date:  09/18/16               Expected Discharge Plan:     In-House Referral:     Discharge planning Services     Post Acute Care Choice:    Choice offered to:     DME Arranged:    DME Agency:     HH Arranged:    HH Agency:     Status of Service:     If discussed at [employer], dates discussed:    Additional Comments:  Rolm Baptise, RN 09/17/2016, 10:30 AM

## 2016-09-17 NOTE — Progress Notes (Signed)
Rehab admissions - I met with patient and I gave him rehab booklets.  I explained that rehab beds are limited today/tomorrow.  However, I will open the case with [facility] medicare and request acute inpatient rehab admission.  Patient is open to CIR versus SNF pending bed availability.  His brother can assist at home after rehab.  I will follow up tomorrow after I hear back from insurance case manager.  Call me for questions.  #[phone]

## 2016-09-17 NOTE — Clinical Social Work Placement (Signed)
   CLINICAL SOCIAL WORK PLACEMENT  NOTE  Date:  09/17/2016  Patient Details  Name: Randall Mccall MRN: [id number] Date of Birth: [birth year]  Clinical Social Work is seeking post-discharge placement for this patient at the [place] level of care (*CSW will initial, date and re-position this form in  chart as items are completed):  Yes   Patient/family provided with [place] Work Department's list of facilities offering this level of care within the geographic area requested by the patient (or if unable, by the patient's family).  Yes   Patient/family informed of their freedom to choose among providers that offer the needed level of care, that participate in Medicare, Medicaid or managed care program needed by the patient, have an available bed and are willing to accept the patient.  Yes   Patient/family informed of [place]'s ownership interest in [facility] and [facility], as well as of the fact that they are under no obligation to receive care at these facilities.  PASRR submitted to EDS on 09/17/16     PASRR number received on 09/17/16     Existing PASRR number confirmed on       FL2 transmitted to all facilities in geographic area requested by pt/family on 09/17/16     FL2 transmitted to all facilities within larger geographic area on       Patient informed that his/her managed care company has contracts with or will negotiate with certain facilities, including the following:        Yes   Patient/family informed of bed offers received.  Patient chooses bed at [facility]     Physician recommends and patient chooses bed at      Patient to be transferred to [facility] on 09/17/16.  Patient to be transferred to facility by ptar     Patient family notified on 09/17/16 of transfer.  Name of family member notified:  pt to notify mom     PHYSICIAN Please sign FL2     Additional Comment:     _______________________________________________ Jorge Ny, LCSW 09/17/2016, 4:07 PM

## 2016-09-17 NOTE — NC FL2 (Signed)
[place] LEVEL OF CARE SCREENING TOOL     IDENTIFICATION  Patient Name: Randall Mccall Birthdate: Jun 23, 1971 Sex: male Admission Date (Current Location): 09/15/2016  [facility] and [place] Number:  Herbalist and Address:  [facility], [place] [address], [place], [place] [address]      Provider Number: [id number]  Attending Physician Name and Address:  Nita Sells, MD  Relative Name and Phone Number:       Current Level of Care: Hospital Recommended Level of Care: [place] Prior Approval Number:    Date Approved/Denied:   PASRR Number: [id number]  Discharge Plan: SNF    Current Diagnoses: Patient Active Problem List   Diagnosis Date Noted  . Acute ischemic stroke ([place]) 09/15/2016  . Stroke (cerebrum) ([place]) 09/15/2016  . Hypertension 09/15/2016  . Vitamin B12 deficiency 08/10/2015  . Weakness 07/02/2013  . Abnormal CPK 07/02/2013    Orientation RESPIRATION BLADDER Height & Weight     Self, Time, Situation, Place  Normal Continent Weight: 244 lb 9.6 oz (110.9 kg) Height:  6\' 1"  (185.4 cm)  BEHAVIORAL SYMPTOMS/MOOD NEUROLOGICAL BOWEL NUTRITION STATUS      Continent Diet (see DC summary)  AMBULATORY STATUS COMMUNICATION OF NEEDS Skin   Extensive Assist Verbally Normal                       Personal Care Assistance Level of Assistance  Bathing, Dressing Bathing Assistance: Maximum assistance   Dressing Assistance: Maximum assistance     Functional Limitations Info             SPECIAL CARE FACTORS FREQUENCY  PT (By licensed PT), OT (By licensed OT), Speech therapy     PT Frequency: 5/wk OT Frequency: 5/wk     Speech Therapy Frequency: 2/wk      Contractures      Additional Factors Info  Code Status, Allergies Code Status Info: FULL Allergies Info: NKA           Current Medications (09/17/2016):  This is the current hospital active medication list Current  Facility-Administered Medications  Medication Dose Route Frequency Provider Last Rate Last Dose  . acetaminophen (TYLENOL) tablet 650 mg  650 mg Oral Q4H PRN Vianne Bulls, MD   650 mg at 09/16/16 0800   Or  . acetaminophen (TYLENOL) solution 650 mg  650 mg Per Tube Q4H PRN Vianne Bulls, MD       Or  . acetaminophen (TYLENOL) suppository 650 mg  650 mg Rectal Q4H PRN Vianne Bulls, MD      . allopurinol (ZYLOPRIM) tablet 100 mg  100 mg Oral Daily Vianne Bulls, MD   100 mg at 09/17/16 0919  . aspirin suppository 300 mg  300 mg Rectal Daily Vianne Bulls, MD       Or  . aspirin tablet 325 mg  325 mg Oral Daily Vianne Bulls, MD   325 mg at 09/17/16 0919  . atorvastatin (LIPITOR) tablet 80 mg  80 mg Oral q1800 Nita Sells, MD   80 mg at 09/16/16 1715  . clopidogrel (PLAVIX) tablet 75 mg  75 mg Oral Daily David L Rinehuls, PA-C   75 mg at 09/17/16 0919  . enoxaparin (LOVENOX) injection 40 mg  40 mg Subcutaneous Daily Vianne Bulls, MD   40 mg at 09/17/16 0919  . labetalol (NORMODYNE,TRANDATE) injection 5-10 mg  5-10 mg Intravenous Q2H PRN Ilene Qua  Opyd, MD   10 mg at 09/17/16 [id number]  . senna-docusate (Senokot-S) tablet 1 tablet  1 tablet Oral QHS PRN Vianne Bulls, MD         Discharge Medications: Please see discharge summary for a list of discharge medications.  Relevant Imaging Results:  Relevant Lab Results:   Additional Information SS#: [id number]  Jorge Ny, LCSW

## 2016-09-17 NOTE — Progress Notes (Signed)
STROKE TEAM PROGRESS NOTE   HISTORY OF PRESENT ILLNESS (per record) Randall Mccall is a 46 y.o. male who was in his normal state until around 1 PM at which point he began having right-sided weakness. He decided to lay down and see if things would get better, but when he awoke it had not therefore he presented to the emergency room.   SUBJECTIVE (INTERVAL HISTORY) No family members present. The patient notes continued right sided weakness upper extremity more so than the lower extremity.Urine drug screen is positive for cocaine. Patient counseled to quit drugs OBJECTIVE Temp:  [97.8 F (36.6 C)-99.1 F (37.3 C)] 98.8 F (37.1 C) (01/22 1445) Pulse Rate:  [63-82] 72 (01/22 1445) Cardiac Rhythm: Normal sinus rhythm (01/22 0723) Resp:  [18-20] 20 (01/22 1445) BP: (154-176)/(102-120) 165/102 (01/22 1445) SpO2:  [98 %-100 %] 98 % (01/22 1445)  CBC:   Recent Labs Lab 09/15/16 2028 09/15/16 2039  WBC 7.5  --   NEUTROABS 3.8  --   HGB 16.1 17.3*  HCT 47.9 51.0  MCV 91.8  --   PLT 313  --     Basic Metabolic Panel:   Recent Labs Lab 09/15/16 2028 09/15/16 2039  NA 136 141  K 3.7 3.9  CL 102 104  CO2 20*  --   GLUCOSE 107* 108*  BUN 16 20  CREATININE 1.27* 1.30*  CALCIUM 9.5  --     Lipid Panel:     Component Value Date/Time   CHOL 235 (H) 09/16/2016 0231   TRIG 73 09/16/2016 0231   HDL 61 09/16/2016 0231   CHOLHDL 3.9 09/16/2016 0231   VLDL 15 09/16/2016 0231   LDLCALC 159 (H) 09/16/2016 0231   HgbA1c:  Lab Results  Component Value Date   HGBA1C 5.8 (H) 09/16/2016   Urine Drug Screen:     Component Value Date/Time   LABOPIA NONE DETECTED 09/16/2016 1645   COCAINSCRNUR POSITIVE (A) 09/16/2016 1645   LABBENZ NONE DETECTED 09/16/2016 1645   AMPHETMU NONE DETECTED 09/16/2016 1645   THCU NONE DETECTED 09/16/2016 1645   LABBARB NONE DETECTED 09/16/2016 1645      IMAGING  Ct Angio Head and Neck W Or Wo Contrast 09/15/2016 1. High-grade atherosclerotic  stenosis versus nonocclusive thrombus at the left MCA bifurcation, predominantly involving the M2 superior division.  2. Moderate right cavernous ICA stenosis.  3. No cervical carotid artery or vertebral artery stenosis.      Mr Jodene Nam Head/brain Wo Cm 09/16/2016 1. Subcentimeter focus of acute infarction within the left posterior limb of internal capsule.   No acute hemorrhage identified.  2. Nonspecific foci of T2 FLAIR hyperintense signal abnormality in white matter probably represents advanced chronic microvascular ischemic changes for patient's age. Chronic lacunar infarct within the left caudate head.  3. Patent circle of Willis without evidence for high-grade stenosis, large vessel occlusion or aneurysm.  4. Long segment of mild-to-moderate stenosis of left M2 prox superior division, less conspicuous when compared with prior CT angiogram of the head.     Ct Head Code Stroke W/o Cm 09/15/2016 1. Acute to subacute appearing LEFT lenticulostriate lacunar infarct. Mild atherosclerosis. Mild to moderate white matter changes most consistent with chronic small vessel ischemic disease, advanced for age.  2. ASPECTS is 9.    Urine drug screen positive for cocaine  PHYSICAL EXAM Pleasant middle aged obese african [place] male not in distress. . Afebrile. Head is nontraumatic. Neck is supple without bruit.    Cardiac exam no  murmur or gallop. Lungs are clear to auscultation. Distal pulses are well felt.  Neurological Exam :  Awake alert oriented to time place and person. Speech mild dysarthria. Right lower facial weakness. Extraocular movements full range w Blinks to threat bilaterally. Fundi were not visualized. Tongue is midline. Right upper extremity 0/5 strength hypotonia. Right lower extremity 4/5 strength with mild weakness of hip flexors and ankle dorsiflexors  Normal strength on the left side. Sensation is intact bilaterally. Coordination is normal except cannot test in RUE. Right  plantar is upgoing. Left is downgoing. Gait not tested.     ASSESSMENT/PLAN Mr. Randall Mccall is a 46 y.o. male with history of hypertension and gout presenting with right-sided weakness. He did not receive IV t-PA due to a presentation.  Stroke:  Dominant infarct likely  Small vessel disease but MRA shows moderate stenosis left M2 segment.  Resultant - right hemiparesis  MRI - Subcentimeter focus of acute infarction within the left posterior limb of internal capsule.    Chronic lacunar infarct within the left caudate head.   MRA - Long segment of mild-to-moderate stenosis of left M2 prox superior division.  Carotid Doppler - CTA neck  CTA H&N - High-grade atherosclerotic stenosis versus nonocclusive thrombus at the left MCA bifurcation.  2D Echo - EF 60-65%. No cardiac source of emboli identified.  LDL - 159  HgbA1c - 5.8  VTE prophylaxis - Lovenox Diet Heart Room service appropriate? Yes; Fluid consistency: Thin Diet - low sodium heart healthy  No antithrombotic prior to admission, now on aspirin 325 mg daily (Plavix 300 mg times one given Saturday)  Patient counseled to be compliant with his antithrombotic medications  Ongoing aggressive stroke risk factor management  Therapy recommendations: CIR recommended - MD consult obtained - admissions coordinator will follow.  Disposition: Pending  Hypertension  Blood pressure running somewhat high  Permissive hypertension (OK if < 220/120) but gradually normalize in 5-7 days  Long-term BP goal normotensive  Hyperlipidemia  Home meds: No lipid lowering medications prior to admission.  LDL 159, goal < 70  Now on Lipitor 80 mg daily  Continue statin at discharge    Other Stroke Risk Factors  ETOH use, advised to drink no more than 1 drink per day  Obesity, Body mass index is 32.27 kg/m., recommend weight loss, diet and exercise as appropriate   Previous stroke by imaging   Other Active Problems  UDS -  positive for cocaine  Creatinine - 1.30  PLAN  CIR consult  Aspirin 325 mg daily alone and no need for plavix due to small vessel disease infarct no role for dual antiplatelet therapy.   Hospital day # 2  Mikey Bussing PA-C [facility] Pager [phone] 09/17/2016, 3:46 PM    I have personally examined this patient, reviewed notes, independently viewed imaging studies, participated in medical decision making and plan of care.ROS completed by me personally and pertinent positives fully documented  I have made any additions or clarifications directly to the above note. Agree with note above. He has presented with left brain subcortical infarct from small vessel disease and has coincident modertae intracranial atherosclerosis but asymptomatic.Continue Aspirin 325 mg daily  Aggressive risk factor modification and ongoing stroke evaluation. Greater than 50% time during this 25 minute visit was spent on counselling and coordination of care about stroke, risk, evaluation, prevention and treatment. Patient counseled to quit doing drugs. Stroke team will sign off. Follow-up as an outpatient in the stroke clinic in  6 weeks. Discussed with Dr. Verlon Au.  Antony Contras, MD Medical Director [facility] Pager: [phone] 09/17/2016 4:37 PM  To contact Stroke Continuity provider, please refer to [email or web address] After hours, contact General Neurology

## 2016-09-18 ENCOUNTER — Non-Acute Institutional Stay (SKILLED_NURSING_FACILITY): Payer: Medicare Other | Admitting: Adult Health

## 2016-09-18 DIAGNOSIS — I69331 Monoplegia of upper limb following cerebral infarction affecting right dominant side: Secondary | ICD-10-CM | POA: Diagnosis not present

## 2016-09-18 DIAGNOSIS — F1410 Cocaine abuse, uncomplicated: Secondary | ICD-10-CM

## 2016-09-18 DIAGNOSIS — M1009 Idiopathic gout, multiple sites: Secondary | ICD-10-CM

## 2016-09-18 DIAGNOSIS — I10 Essential (primary) hypertension: Secondary | ICD-10-CM | POA: Diagnosis not present

## 2016-09-18 DIAGNOSIS — I639 Cerebral infarction, unspecified: Secondary | ICD-10-CM | POA: Diagnosis not present

## 2016-09-20 ENCOUNTER — Encounter: Payer: Self-pay | Admitting: Internal Medicine

## 2016-09-20 ENCOUNTER — Non-Acute Institutional Stay (SKILLED_NURSING_FACILITY): Payer: Medicare Other | Admitting: Internal Medicine

## 2016-09-20 DIAGNOSIS — I5189 Other ill-defined heart diseases: Secondary | ICD-10-CM

## 2016-09-20 DIAGNOSIS — I10 Essential (primary) hypertension: Secondary | ICD-10-CM

## 2016-09-20 DIAGNOSIS — I69359 Hemiplegia and hemiparesis following cerebral infarction affecting unspecified side: Secondary | ICD-10-CM | POA: Diagnosis not present

## 2016-09-20 DIAGNOSIS — M1009 Idiopathic gout, multiple sites: Secondary | ICD-10-CM

## 2016-09-20 DIAGNOSIS — R7303 Prediabetes: Secondary | ICD-10-CM

## 2016-09-20 DIAGNOSIS — Z8673 Personal history of transient ischemic attack (TIA), and cerebral infarction without residual deficits: Secondary | ICD-10-CM | POA: Diagnosis not present

## 2016-09-20 DIAGNOSIS — I519 Heart disease, unspecified: Secondary | ICD-10-CM

## 2016-09-20 DIAGNOSIS — N182 Chronic kidney disease, stage 2 (mild): Secondary | ICD-10-CM

## 2016-09-20 DIAGNOSIS — F1410 Cocaine abuse, uncomplicated: Secondary | ICD-10-CM

## 2016-09-20 NOTE — Progress Notes (Signed)
Patient ID: Randall Mccall, male   DOB: 1970/10/20, 46 y.o.   MRN: [id number]    HISTORY AND PHYSICAL   DATE: 09/20/2016  Location:    [place] Room Number: 126 B Place of Service: SNF (31)   Extended Emergency Contact Information Primary Emergency Contact: Cheeks,Esther Address: [address]          [place],  [address] [place] Phone: [phone] Relation: Mother  Advanced Directive information Does Patient Have a Medical Advance Directive?: No, Would patient like information on creating a medical advance directive?: No - Patient declined  Chief Complaint  Patient presents with  . New Admit To SNF    HPI:  46 yo male seen today as a new admission into SNF following hospital stay for acute ischemic CVA, HTN, cocaine abuse, hx myopathy. He presented to the ED with acute right facial droop and right arm weakness. CT head revealed subacute left lacunar infarct. MRI brain (+) acute left lacunar CVA. 2D echo with EF 55-60% with DD. Permissive HTN recommended. Hgb 17.3; Plts 313K; LDL 159; Tchol 235; HDL 61; Cr 1.27-->1.3; albumin 4.5; A1c 5.8%; UDS (+) cocaine at d/c. He presents to SNF for short term rehab.  Today he reports c/a right sided weakness. He denies CP, SOB, palpitations, HA or dizziness, visual changes or hearing loss. No dysphagia or choking. Gait is unsteady due to stroke. Appetite ok and sleeps well.  Cocaine abuse - no known use since admission to SNF  HTN - BP stable on norvasc and clonidine  Gout - stable on allopurinol 181m daily and colchicine daily  Hx CVA - he recently had an acute left lacunar infarct. No new neurologic deficits. He has right hemiparesis and right facial droop. Takes ASA daily  Hx myopathy - EMGs abnormal in past. Followed by neurology Dr YKrista Blue Hyperlipidemia - uncontrolled. LDL 159; Tchol 235; HDL 61. Started on lipitor 860mqhs prior to hospital d/c  CKD - stage 1-2. Cr 1.3 at d/c  Past Medical  History:  Diagnosis Date  . Gout   . Hypertension   . Muscle weakness     Past Surgical History:  Procedure Laterality Date  . KNEE SURGERY      Patient Care Team: AnGavin PoundMD as PCP - General (Internal Medicine)  Social History   Social History  . Marital status: Single    Spouse name: N/A  . Number of children: 0  . Years of education: 12   Occupational History  .      Disabled   Social History Main Topics  . Smoking status: Never Smoker  . Smokeless tobacco: Never Used  . Alcohol use 0.6 oz/week    1 Cans of beer per week  . Drug use: No  . Sexual activity: Not on file   Other Topics Concern  . Not on file   Social History Narrative   Patient lives alone and he is single.   Right handed.   Caffeine-    Education- 12 th grade   Disabled.     reports that he has never smoked. He has never used smokeless tobacco. He reports that he drinks about 0.6 oz of alcohol per week . He reports that he does not use drugs.  History reviewed. No pertinent family history. Family Status  Relation Status  . Mother Alive  . Father Deceased    Immunization History  Administered Date(s) Administered  . Tdap 05/11/2012  No Known Allergies  Medications: Patient's Medications  New Prescriptions   No medications on file  Previous Medications   ALLOPURINOL (ZYLOPRIM) 100 MG TABLET    Take 100 mg by mouth daily.   AMLODIPINE (NORVASC) 10 MG TABLET    Take 1 tablet (10 mg total) by mouth daily.   ASPIRIN 325 MG TABLET    Take 1 tablet (325 mg total) by mouth daily.   ATORVASTATIN (LIPITOR) 80 MG TABLET    Take 1 tablet (80 mg total) by mouth daily at 6 PM.   CLONIDINE (CATAPRES) 0.1 MG TABLET    Take 1 tablet (0.1 mg total) by mouth 2 (two) times daily.   COLCHICINE 0.6 MG TABLET    Take 0.6 mg by mouth daily.   OXYCODONE-ACETAMINOPHEN (PERCOCET/ROXICET) 5-325 MG TABLET    Take 1 tablet by mouth every 8 (eight) hours as needed for severe pain.   SENNA-DOCUSATE  (SENOKOT-S) 8.6-50 MG TABLET    Take 1 tablet by mouth at bedtime as needed for mild constipation.  Modified Medications   No medications on file  Discontinued Medications   COLCHICINE 0.6 MG TABLET    Take 1 tablet (0.6 mg total) by mouth daily. Until symptoms resolve    Review of Systems  Musculoskeletal: Positive for gait problem.  Neurological: Positive for weakness. Negative for numbness.  All other systems reviewed and are negative.   Vitals:   09/20/16 1208  BP: 136/79  Pulse: 76  Resp: 18  Temp: 98.9 F (37.2 C)  TempSrc: Oral  SpO2: 97%  Weight: 244 lb 9.6 oz (110.9 kg)  Height: '6\' 1"'  (1.854 m)   Body mass index is 32.27 kg/m.  Physical Exam  Constitutional: He is oriented to person, place, and time. He appears well-developed and well-nourished.  Looks well sitting in chair in NAD, speech slurred  HENT:  Mouth/Throat: Oropharynx is clear and moist. No oropharyngeal exudate.  Eyes: Pupils are equal, round, and reactive to light. No scleral icterus.  Neck: Neck supple. Carotid bruit is not present. No thyromegaly present.  Cardiovascular: Normal rate, regular rhythm, normal heart sounds and intact distal pulses.  Exam reveals no gallop and no friction rub.   No murmur heard. no distal LE swelling. No calf TTP  Pulmonary/Chest: Effort normal and breath sounds normal. He has no wheezes. He has no rales. He exhibits no tenderness.  Abdominal: Soft. Bowel sounds are normal. He exhibits no distension, no abdominal bruit, no pulsatile midline mass and no mass. There is no hepatomegaly. There is no tenderness. There is no rebound and no guarding.  Musculoskeletal: He exhibits edema.  Lymphadenopathy:    He has no cervical adenopathy.  Neurological: He is alert and oriented to person, place, and time. A cranial nerve deficit (right facial droop; right upper eyelid palsy) is present.  Right upper and lower hemiparesis  Skin: Skin is warm and dry. No rash noted.    Psychiatric: He has a normal mood and affect. His behavior is normal. Thought content normal.     Labs reviewed: Admission on 09/15/2016, Discharged on 09/17/2016  Component Date Value Ref Range Status  . Prothrombin Time 09/15/2016 12.0  11.4 - 15.2 seconds Final  . INR 09/15/2016 0.89   Final  . aPTT 09/15/2016 33  24 - 36 seconds Final  . WBC 09/15/2016 7.5  4.0 - 10.5 K/uL Final  . RBC 09/15/2016 5.22  4.22 - 5.81 MIL/uL Final  . Hemoglobin 09/15/2016 16.1  13.0 - 17.0 g/dL  Final  . HCT 09/15/2016 47.9  39.0 - 52.0 % Final  . MCV 09/15/2016 91.8  78.0 - 100.0 fL Final  . MCH 09/15/2016 30.8  26.0 - 34.0 pg Final  . MCHC 09/15/2016 33.6  30.0 - 36.0 g/dL Final  . RDW 09/15/2016 15.4  11.5 - 15.5 % Final  . Platelets 09/15/2016 313  150 - 400 K/uL Final  . Neutrophils Relative % 09/15/2016 52  % Final  . Neutro Abs 09/15/2016 3.8  1.7 - 7.7 K/uL Final  . Lymphocytes Relative 09/15/2016 37  % Final  . Lymphs Abs 09/15/2016 2.7  0.7 - 4.0 K/uL Final  . Monocytes Relative 09/15/2016 7  % Final  . Monocytes Absolute 09/15/2016 0.6  0.1 - 1.0 K/uL Final  . Eosinophils Relative 09/15/2016 4  % Final  . Eosinophils Absolute 09/15/2016 0.3  0.0 - 0.7 K/uL Final  . Basophils Relative 09/15/2016 0  % Final  . Basophils Absolute 09/15/2016 0.0  0.0 - 0.1 K/uL Final  . Sodium 09/15/2016 136  135 - 145 mmol/L Final  . Potassium 09/15/2016 3.7  3.5 - 5.1 mmol/L Final  . Chloride 09/15/2016 102  101 - 111 mmol/L Final  . CO2 09/15/2016 20* 22 - 32 mmol/L Final  . Glucose, Bld 09/15/2016 107* 65 - 99 mg/dL Final  . BUN 09/15/2016 16  6 - 20 mg/dL Final  . Creatinine, Ser 09/15/2016 1.27* 0.61 - 1.24 mg/dL Final  . Calcium 09/15/2016 9.5  8.9 - 10.3 mg/dL Final  . Total Protein 09/15/2016 8.0  6.5 - 8.1 g/dL Final  . Albumin 09/15/2016 4.5  3.5 - 5.0 g/dL Final  . AST 09/15/2016 37  15 - 41 U/L Final  . ALT 09/15/2016 21  17 - 63 U/L Final  . Alkaline Phosphatase 09/15/2016 85  38 - 126  U/L Final  . Total Bilirubin 09/15/2016 0.5  0.3 - 1.2 mg/dL Final  . GFR calc non Af Amer 09/15/2016 >60  >60 mL/min Final  . GFR calc Af Amer 09/15/2016 >60  >60 mL/min Final   Comment: (NOTE) The eGFR has been calculated using the CKD EPI equation. This calculation has not been validated in all clinical situations. eGFR's persistently <60 mL/min signify possible Chronic Kidney Disease.   . Anion gap 09/15/2016 14  5 - 15 Final  . Troponin i, poc 09/15/2016 0.01  0.00 - 0.08 ng/mL Final  . Comment 3 09/15/2016          Final   Comment: Due to the release kinetics of cTnI, a negative result within the first hours of the onset of symptoms does not rule out myocardial infarction with certainty. If myocardial infarction is still suspected, repeat the test at appropriate intervals.   . Sodium 09/15/2016 141  135 - 145 mmol/L Final  . Potassium 09/15/2016 3.9  3.5 - 5.1 mmol/L Final  . Chloride 09/15/2016 104  101 - 111 mmol/L Final  . BUN 09/15/2016 20  6 - 20 mg/dL Final  . Creatinine, Ser 09/15/2016 1.30* 0.61 - 1.24 mg/dL Final  . Glucose, Bld 09/15/2016 108* 65 - 99 mg/dL Final  . Calcium, Ion 09/15/2016 1.15  1.15 - 1.40 mmol/L Final  . TCO2 09/15/2016 26  0 - 100 mmol/L Final  . Hemoglobin 09/15/2016 17.3* 13.0 - 17.0 g/dL Final  . HCT 09/15/2016 51.0  39.0 - 52.0 % Final  . Hgb A1c MFr Bld 09/16/2016 5.8* 4.8 - 5.6 % Final   Comment: (NOTE)  Pre-diabetes: 5.7 - 6.4         Diabetes: >6.4         Glycemic control for adults with diabetes: <7.0   . Mean Plasma Glucose 09/16/2016 120  mg/dL Final   Comment: (NOTE) Performed At: [facility] [place], [place] [id number] Lindon Romp MD [id number]   . Cholesterol 09/16/2016 235* 0 - 200 mg/dL Final  . Triglycerides 09/16/2016 73  <150 mg/dL Final  . HDL 09/16/2016 61  >40 mg/dL Final  . Total CHOL/HDL Ratio 09/16/2016 3.9  RATIO Final  . VLDL 09/16/2016 15  0 - 40 mg/dL Final  . LDL  Cholesterol 09/16/2016 159* 0 - 99 mg/dL Final   Comment:        Total Cholesterol/HDL:CHD Risk Coronary Heart Disease Risk Table                     Men   Women  1/2 Average Risk   3.4   3.3  Average Risk       5.0   4.4  2 X Average Risk   9.6   7.1  3 X Average Risk  23.4   11.0        Use the calculated Patient Ratio above and the CHD Risk Table to determine the patient's CHD Risk.        ATP III CLASSIFICATION (LDL):  <100     mg/dL   Optimal  100-129  mg/dL   Near or Above                    Optimal  130-159  mg/dL   Borderline  160-189  mg/dL   High  >190     mg/dL   Very High   . Weight 09/16/2016 3913.6  oz Final  . Height 09/16/2016 73  in Final  . BP 09/16/2016 144/112  mmHg Final  . Opiates 09/16/2016 NONE DETECTED  NONE DETECTED Final  . Cocaine 09/16/2016 POSITIVE* NONE DETECTED Final  . Benzodiazepines 09/16/2016 NONE DETECTED  NONE DETECTED Final  . Amphetamines 09/16/2016 NONE DETECTED  NONE DETECTED Final  . Tetrahydrocannabinol 09/16/2016 NONE DETECTED  NONE DETECTED Final  . Barbiturates 09/16/2016 NONE DETECTED  NONE DETECTED Final   Comment:        DRUG SCREEN FOR MEDICAL PURPOSES ONLY.  IF CONFIRMATION IS NEEDED FOR ANY PURPOSE, NOTIFY LAB WITHIN 5 DAYS.        LOWEST DETECTABLE LIMITS FOR URINE DRUG SCREEN Drug Class       Cutoff (ng/mL) Amphetamine      1000 Barbiturate      200 Benzodiazepine   811 Tricyclics       572 Opiates          300 Cocaine          300 THC              50   Admission on 07/19/2016, Discharged on 07/19/2016  Component Date Value Ref Range Status  . WBC 07/19/2016 10.8* 4.0 - 10.5 K/uL Final  . RBC 07/19/2016 4.61  4.22 - 5.81 MIL/uL Final  . Hemoglobin 07/19/2016 14.3  13.0 - 17.0 g/dL Final  . HCT 07/19/2016 41.4  39.0 - 52.0 % Final  . MCV 07/19/2016 89.8  78.0 - 100.0 fL Final  . MCH 07/19/2016 31.0  26.0 - 34.0 pg Final  . MCHC 07/19/2016 34.5  30.0 - 36.0 g/dL Final  . RDW  07/19/2016 12.6  11.5 - 15.5 %  Final  . Platelets 07/19/2016 485* 150 - 400 K/uL Final  . Sodium 07/19/2016 133* 135 - 145 mmol/L Final  . Potassium 07/19/2016 4.0  3.5 - 5.1 mmol/L Final  . Chloride 07/19/2016 100* 101 - 111 mmol/L Final  . CO2 07/19/2016 24  22 - 32 mmol/L Final  . Glucose, Bld 07/19/2016 120* 65 - 99 mg/dL Final  . BUN 07/19/2016 16  6 - 20 mg/dL Final  . Creatinine, Ser 07/19/2016 1.08  0.61 - 1.24 mg/dL Final  . Calcium 07/19/2016 9.6  8.9 - 10.3 mg/dL Final  . GFR calc non Af Amer 07/19/2016 >60  >60 mL/min Final  . GFR calc Af Amer 07/19/2016 >60  >60 mL/min Final   Comment: (NOTE) The eGFR has been calculated using the CKD EPI equation. This calculation has not been validated in all clinical situations. eGFR's persistently <60 mL/min signify possible Chronic Kidney Disease.   . Anion gap 07/19/2016 9  5 - 15 Final    Ct Angio Head W Or Wo Contrast  Result Date: 09/15/2016 CLINICAL DATA:  Right facial droop, right arm weakness, and slurred speech. EXAM: CT ANGIOGRAPHY HEAD AND NECK TECHNIQUE: Multidetector CT imaging of the head and neck was performed using the standard protocol during bolus administration of intravenous contrast. Multiplanar CT image reconstructions and MIPs were obtained to evaluate the vascular anatomy. Carotid stenosis measurements (when applicable) are obtained utilizing NASCET criteria, using the distal internal carotid diameter as the denominator. CONTRAST:  50 mL Isovue 370 COMPARISON:  None. FINDINGS: CTA NECK FINDINGS Aortic arch: 3 vessel aortic arch. Widely patent brachiocephalic and subclavian arteries. Right carotid system: Patent without evidence of stenosis or dissection. Minimal soft plaque at the carotid bifurcation. Left carotid system: Patent without evidence of stenosis or dissection. Mild, predominantly soft plaque at the carotid bifurcation. Vertebral arteries: Patent without evidence of stenosis or dissection. Left vertebral artery is strongly dominant.  Skeleton: Mild cervical spondylosis. Other neck: No mass or lymph node enlargement. Upper chest: Mild motion artifact through the lung apices with dependent subsegmental atelectasis. Review of the MIP images confirms the above findings CTA HEAD FINDINGS Anterior circulation: The internal carotid arteries are patent from skullbase to carotid termini with bilateral siphon atherosclerosis. There is moderate right anterior cavernous segment stenosis. The MCAs are patent with an early bifurcation noted on the left. There is nonocclusive thrombus versus atherosclerotic narrowing involving the left MCA bifurcation, predominantly the superior M2 division which is severely narrowed and thready in appearance over a 6 mm length. There is moderate stenosis of the proximal M2 inferior division. The right MCA and both ACAs are patent without evidence of major branch occlusion or significant proximal stenosis. Mild diffuse branch vessel irregularity is present. The left A1 segment is dominant. No aneurysm. Posterior circulation: Intracranial left vertebral artery is widely patent and supplies the basilar. The right vertebral artery ends in PICA. Left PICA is also patent. The basilar artery is patent and tortuous with mild proximal and mid segment stenoses. SCA origins are patent. There is a patent left posterior communicating artery with hypoplastic left P1 segment. No significant PCA stenosis is identified. No aneurysm. Venous sinuses: Grossly patent within limitations of arterial phase dominant contrast timing. Anatomic variants: None of significance. Review of the MIP images confirms the above findings IMPRESSION: 1. High-grade atherosclerotic stenosis versus nonocclusive thrombus at the left MCA bifurcation, predominantly involving the M2 superior division. 2. Moderate right cavernous ICA stenosis. 3.  No cervical carotid artery or vertebral artery stenosis. These results were called by telephone at the time of interpretation  on 09/15/2016 at 9:35 pm to Dr. Roland Rack , who verbally acknowledged these results. Electronically Signed   By: Logan Bores M.D.   On: 09/15/2016 21:52   Ct Angio Neck W Or Wo Contrast  Result Date: 09/15/2016 CLINICAL DATA:  Right facial droop, right arm weakness, and slurred speech. EXAM: CT ANGIOGRAPHY HEAD AND NECK TECHNIQUE: Multidetector CT imaging of the head and neck was performed using the standard protocol during bolus administration of intravenous contrast. Multiplanar CT image reconstructions and MIPs were obtained to evaluate the vascular anatomy. Carotid stenosis measurements (when applicable) are obtained utilizing NASCET criteria, using the distal internal carotid diameter as the denominator. CONTRAST:  50 mL Isovue 370 COMPARISON:  None. FINDINGS: CTA NECK FINDINGS Aortic arch: 3 vessel aortic arch. Widely patent brachiocephalic and subclavian arteries. Right carotid system: Patent without evidence of stenosis or dissection. Minimal soft plaque at the carotid bifurcation. Left carotid system: Patent without evidence of stenosis or dissection. Mild, predominantly soft plaque at the carotid bifurcation. Vertebral arteries: Patent without evidence of stenosis or dissection. Left vertebral artery is strongly dominant. Skeleton: Mild cervical spondylosis. Other neck: No mass or lymph node enlargement. Upper chest: Mild motion artifact through the lung apices with dependent subsegmental atelectasis. Review of the MIP images confirms the above findings CTA HEAD FINDINGS Anterior circulation: The internal carotid arteries are patent from skullbase to carotid termini with bilateral siphon atherosclerosis. There is moderate right anterior cavernous segment stenosis. The MCAs are patent with an early bifurcation noted on the left. There is nonocclusive thrombus versus atherosclerotic narrowing involving the left MCA bifurcation, predominantly the superior M2 division which is severely narrowed  and thready in appearance over a 6 mm length. There is moderate stenosis of the proximal M2 inferior division. The right MCA and both ACAs are patent without evidence of major branch occlusion or significant proximal stenosis. Mild diffuse branch vessel irregularity is present. The left A1 segment is dominant. No aneurysm. Posterior circulation: Intracranial left vertebral artery is widely patent and supplies the basilar. The right vertebral artery ends in PICA. Left PICA is also patent. The basilar artery is patent and tortuous with mild proximal and mid segment stenoses. SCA origins are patent. There is a patent left posterior communicating artery with hypoplastic left P1 segment. No significant PCA stenosis is identified. No aneurysm. Venous sinuses: Grossly patent within limitations of arterial phase dominant contrast timing. Anatomic variants: None of significance. Review of the MIP images confirms the above findings IMPRESSION: 1. High-grade atherosclerotic stenosis versus nonocclusive thrombus at the left MCA bifurcation, predominantly involving the M2 superior division. 2. Moderate right cavernous ICA stenosis. 3. No cervical carotid artery or vertebral artery stenosis. These results were called by telephone at the time of interpretation on 09/15/2016 at 9:35 pm to Dr. Roland Rack , who verbally acknowledged these results. Electronically Signed   By: Logan Bores M.D.   On: 09/15/2016 21:52   Mr Brain Wo Contrast  Result Date: 09/16/2016 CLINICAL DATA:  46 y/o  M; right upper extremity weakness. EXAM: MRI HEAD WITHOUT CONTRAST MRA HEAD WITHOUT CONTRAST TECHNIQUE: Multiplanar, multiecho pulse sequences of the brain and surrounding structures were obtained without intravenous contrast. Angiographic images of the head were obtained using MRA technique without contrast. COMPARISON:  09/15/2016 CT of the head and CT angiogram head and neck. FINDINGS: MRI HEAD FINDINGS Brain: 9 x 5 mm  focus of diffusion  restriction consistent with acute infarct within the left posterior limb of internal capsule. Foci of T2 FLAIR hyperintense signal abnormality in subcortical and periventricular white matter spine nonspecific but probably represent advanced chronic microvascular ischemic changes for patient's age. Chronic hemosiderin stained lacunar infarct within the left caudate head. Vascular: As below. Skull and upper cervical spine: Normal marrow signal. Sinuses/Orbits: Negative. Other: None. MRA HEAD FINDINGS Internal carotid arteries:  Patent. Anterior cerebral arteries:  Patent. Middle cerebral arteries: Patent. Mild-to-moderate stenosis of left M2 superior division origin (series 951, image 11). The segment of stenosis is less conspicuous when compared with the prior CTA of the head. Anterior communicating artery: Patent. Posterior communicating arteries: Patent large left and small right. Posterior cerebral arteries:  Patent. Basilar artery: Mild proximal stenosis. Tortuous course with large rightward loop. Vertebral arteries:  Patent bilaterally. No evidence of high-grade stenosis, large vessel occlusion, or aneurysm unless noted above. IMPRESSION: 1. Subcentimeter focus of acute infarction within the left posterior limb of internal capsule. No acute hemorrhage identified. 2. Nonspecific foci of T2 FLAIR hyperintense signal abnormality in white matter probably represents advanced chronic microvascular ischemic changes for patient's age. Chronic lacunar infarct within the left caudate head. 3. Patent circle of Willis without evidence for high-grade stenosis, large vessel occlusion or aneurysm. 4. Long segment of mild-to-moderate stenosis of left M2 prox superior division, less conspicuous when compared with prior CT angiogram of the head. Electronically Signed   By: Kristine Garbe M.D.   On: 09/16/2016 01:13   Mr Jodene Nam Head/brain XL Cm  Result Date: 09/16/2016 CLINICAL DATA:  46 y/o  M; right upper extremity  weakness. EXAM: MRI HEAD WITHOUT CONTRAST MRA HEAD WITHOUT CONTRAST TECHNIQUE: Multiplanar, multiecho pulse sequences of the brain and surrounding structures were obtained without intravenous contrast. Angiographic images of the head were obtained using MRA technique without contrast. COMPARISON:  09/15/2016 CT of the head and CT angiogram head and neck. FINDINGS: MRI HEAD FINDINGS Brain: 9 x 5 mm focus of diffusion restriction consistent with acute infarct within the left posterior limb of internal capsule. Foci of T2 FLAIR hyperintense signal abnormality in subcortical and periventricular white matter spine nonspecific but probably represent advanced chronic microvascular ischemic changes for patient's age. Chronic hemosiderin stained lacunar infarct within the left caudate head. Vascular: As below. Skull and upper cervical spine: Normal marrow signal. Sinuses/Orbits: Negative. Other: None. MRA HEAD FINDINGS Internal carotid arteries:  Patent. Anterior cerebral arteries:  Patent. Middle cerebral arteries: Patent. Mild-to-moderate stenosis of left M2 superior division origin (series 951, image 11). The segment of stenosis is less conspicuous when compared with the prior CTA of the head. Anterior communicating artery: Patent. Posterior communicating arteries: Patent large left and small right. Posterior cerebral arteries:  Patent. Basilar artery: Mild proximal stenosis. Tortuous course with large rightward loop. Vertebral arteries:  Patent bilaterally. No evidence of high-grade stenosis, large vessel occlusion, or aneurysm unless noted above. IMPRESSION: 1. Subcentimeter focus of acute infarction within the left posterior limb of internal capsule. No acute hemorrhage identified. 2. Nonspecific foci of T2 FLAIR hyperintense signal abnormality in white matter probably represents advanced chronic microvascular ischemic changes for patient's age. Chronic lacunar infarct within the left caudate head. 3. Patent circle of  Willis without evidence for high-grade stenosis, large vessel occlusion or aneurysm. 4. Long segment of mild-to-moderate stenosis of left M2 prox superior division, less conspicuous when compared with prior CT angiogram of the head. Electronically Signed   By: Edgardo Roys.D.  On: 09/16/2016 01:13   Ct Head Code Stroke W/o Cm  Result Date: 09/15/2016 CLINICAL DATA:  Code stroke. RIGHT facial droop. History of hypertension. EXAM: CT HEAD WITHOUT CONTRAST TECHNIQUE: Contiguous axial images were obtained from the base of the skull through the vertex without intravenous contrast. COMPARISON:  None. FINDINGS: BRAIN: LEFT caudate and to lesser extent putaminal low-density infarct, without mass effect nor ex vacuo dilatation subjacent ventricle. Ventricles and sulci are overall normal for patient's age. No intraparenchymal hemorrhage, mass effect, midline shift or acute large vascular territory infarct. Patchy white matter supratentorial hypodensities. No abnormal extra-axial fluid collections. Basal cisterns are patent. VASCULAR: Mild calcific atherosclerosis of the carotid siphons. SKULL/SOFT TISSUES: No skull fracture. No significant soft tissue swelling. ORBITS/SINUSES: The included ocular globes and orbital contents are normal.The mastoid aircells and included paranasal sinuses are well-aerated. OTHER: None. ASPECTS [facility] Stroke Program Early CT Score) - Ganglionic level infarction (caudate, lentiform nuclei, internal capsule, insula, M1-M3 cortex): 6 - Supraganglionic infarction (M4-M6 cortex): 3 Total score (0-10 with 10 being normal): 10 IMPRESSION: 1. Acute to subacute appearing LEFT lenticulostriate lacunar infarct. Mild atherosclerosis. Mild to moderate white matter changes most consistent with chronic small vessel ischemic disease, advanced for age. 2. ASPECTS is 9. Critical Value/emergent results were called by telephone at the time of interpretation on 09/15/2016 at 8:45 pm to Dr.  Leonel Ramsay, Neurology, who verbally acknowledged these results. Electronically Signed   By: Elon Alas M.D.   On: 09/15/2016 20:48     Assessment/Plan   ICD-9-CM ICD-10-CM   1. History of recent stroke V12.54 Z86.73   2. Hemiparesis due to recent stroke ([place]) 438.20 I69.359   3. Essential hypertension 401.9 I10   4. Idiopathic gout of multiple sites, unspecified chronicity 274.00 M10.09   5. Diastolic dysfunction 735.7 I51.9   6. Cocaine abuse 305.60 F14.10   7. Pre-diabetes 790.29 R73.03    A1c 5.8%  8. Stage 2 chronic kidney disease 585.2 N18.2    Allow permissive HTN due to recent stroke  Check CMP and CBC  Counseled him regarding cocaine/illicit drug use cessation  Watch complex CHO  Cont current meds as ordered  F/u with specialists as scheduled  PT/OT/ST as ordered  GOAL: short term rehab and d/c home when medically appropriate. Communicated with pt and nursing.  Will follow  Monica S. Perlie Gold  [facility] [address] [place], [place] [address] [phone] Cell (Monday-Friday 8 AM - 5 PM) [phone] After 5 PM and follow prompts

## 2016-09-27 ENCOUNTER — Other Ambulatory Visit: Payer: Self-pay

## 2016-09-27 MED ORDER — OXYCODONE-ACETAMINOPHEN 5-325 MG PO TABS
1.0000 | ORAL_TABLET | Freq: Three times a day (TID) | ORAL | 0 refills | Status: DC | PRN
Start: 2016-09-27 — End: 2018-05-20

## 2016-09-27 NOTE — Telephone Encounter (Signed)
RX faxed to [facility] @ [phone], phone number [phone] 

## 2016-10-09 NOTE — Progress Notes (Signed)
Location:   [facility]    Place of Service:  SNF (31)   CODE STATUS: full code   No Known Allergies  Chief Complaint  Patient presents with  . Hospitalization Follow-up    HPI:  He has been hospitalized for an acute cva. He has a history of cocaine abuse. He is here for short term rehab. He is not voicing any complaints. His goal to return home as soon he completes his rehab.    Past Medical History:  Diagnosis Date  . Gout   . Hypertension   . Muscle weakness     Past Surgical History:  Procedure Laterality Date  . KNEE SURGERY      Social History   Social History  . Marital status: Single    Spouse name: N/A  . Number of children: 0  . Years of education: 12   Occupational History  .      Disabled   Social History Main Topics  . Smoking status: Never Smoker  . Smokeless tobacco: Never Used  . Alcohol use 0.6 oz/week    1 Cans of beer per week  . Drug use: No  . Sexual activity: Not on file   Other Topics Concern  . Not on file   Social History Narrative   Patient lives alone and he is single.   Right handed.   Caffeine-    Education- 12 th grade   Disabled.   No family history on file.    VITAL SIGNS BP 136/79   Pulse 76   Temp 98.9 F (37.2 C)   Resp 18   Ht 6\' 1"  (1.854 m)   Wt 244 lb (110.7 kg)   SpO2 97%   BMI 32.19 kg/m   Patient's Medications  New Prescriptions   No medications on file  Previous Medications   ALLOPURINOL (ZYLOPRIM) 100 MG TABLET    Take 100 mg by mouth daily.   AMLODIPINE (NORVASC) 10 MG TABLET    Take 1 tablet (10 mg total) by mouth daily.   ASPIRIN 325 MG TABLET    Take 1 tablet (325 mg total) by mouth daily.   ATORVASTATIN (LIPITOR) 80 MG TABLET    Take 1 tablet (80 mg total) by mouth daily at 6 PM.   CLONIDINE (CATAPRES) 0.1 MG TABLET    Take 1 tablet (0.1 mg total) by mouth 2 (two) times daily.   COLCHICINE 0.6 MG TABLET    Take 0.6 mg by mouth daily.   OXYCODONE-ACETAMINOPHEN (PERCOCET/ROXICET)  5-325 MG TABLET    Take 1 tablet by mouth every 8 (eight) hours as needed for severe pain.   SENNA-DOCUSATE (SENOKOT-S) 8.6-50 MG TABLET    Take 1 tablet by mouth at bedtime as needed for mild constipation.  Modified Medications   No medications on file  Discontinued Medications   No medications on file     SIGNIFICANT DIAGNOSTIC EXAMS  09-15-16: ct of head: 1. Acute to subacute appearing LEFT lenticulostriate lacunar infarct.   Mild atherosclerosis. Mild to moderate white matter changes most consistent with chronic small vessel ischemic disease, advanced for age.   2. ASPECTS is 9.  09-15-16: ct angio of head and neck: 1. High-grade atherosclerotic stenosis versus nonocclusive thrombus at the left MCA bifurcation, predominantly involving the M2 superior division. 2. Moderate right cavernous ICA stenosis. 3. No cervical carotid artery or vertebral artery stenosis.  09-16-16: MRA/MRI of brain 1. Subcentimeter focus of acute infarction within the left posterior limb of internal  capsule. No acute hemorrhage identified. 2. Nonspecific foci of T2 FLAIR hyperintense signal abnormality in white matter probably represents advanced chronic microvascular ischemic changes for patient's age. Chronic lacunar infarct within the left caudate head. 3. Patent circle of Willis without evidence for high-grade stenosis, large vessel occlusion or aneurysm. 4. Long segment of mild-to-moderate stenosis of left M2 prox superior division, less conspicuous when compared with prior CT angiogram of the head.  09-16-16:  2-d echo: Left ventricle: Wall thickness was increased increased in a   pattern of mild to moderate LVH. There was mild focal basal   hypertrophy of the septum. Systolic function was normal. The   estimated ejection fraction was in the range of 60% to 65%. Wall   motion was normal; there were no regional wall motion   abnormalities. Doppler parameters are consistent with abnormal   left  ventricular relaxation (grade 1 diastolic dysfunction).     LABS REVIEWED:   09-15-16: wbc 7.5; hgb 16.2; hct 47.9; mcv 91.8; plt 313; glucose 107; bun 16; creat 1.27; k+ 3.7; na++ 136; liver normal albumin 4.5 09-16-16: hgb a1c 5.8; chol 235; ldl 159; trig 73 hdl 61; urine drug screen: + cocaine      Review of Systems  Constitutional: Negative for malaise/fatigue.  Respiratory: Negative for cough and shortness of breath.   Cardiovascular: Negative for chest pain, palpitations and leg swelling.  Gastrointestinal: Negative for abdominal pain, constipation and heartburn.  Musculoskeletal: Negative for back pain, joint pain and myalgias.  Skin: Negative.   Neurological: Negative for dizziness.       Has right sided weakness   Psychiatric/Behavioral: The patient is not nervous/anxious.     Physical Exam  Constitutional: He is oriented to person, place, and time. He appears well-developed and well-nourished. No distress.  Overweight   Eyes: Conjunctivae are normal.  Neck: Neck supple. No JVD present. No thyromegaly present.  Cardiovascular: Normal rate, regular rhythm and intact distal pulses.   Respiratory: Effort normal and breath sounds normal. No respiratory distress. He has no wheezes.  GI: Soft. Bowel sounds are normal. He exhibits no distension. There is no tenderness.  Musculoskeletal: He exhibits no edema.  Has right side weakness present upper extremity worse  Lymphadenopathy:    He has no cervical adenopathy.  Neurological: He is alert and oriented to person, place, and time.  Skin: Skin is warm and dry. He is not diaphoretic.  Psychiatric: He has a normal mood and affect.     ASSESSMENT/ PLAN:  1. 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 Hypertension: will continue norvasc 10 mg daily clonidine 0.1 mg twice daily  3. Dyslipidemia: ldl 159;  will continue lipitor 80 mg daily   4. Gout: will continue allopurinol 100 mg daily and colchicine 0.6 mg daily   5. Cocaine abuse: will monitor   Will check cbc; cmp   Time spent with patient 50   minutes >50% time spent counseling; reviewing medical record; tests; labs; and developing future plan of care     MD is aware of resident's narcotic use and is in agreement with current plan of care. We will attempt to wean resident as apropriate   Ok Edwards NP [facility]  Contact [phone] Monday through Friday 8am- 5pm  After hours call [phone]

## 2016-10-18 ENCOUNTER — Non-Acute Institutional Stay (SKILLED_NURSING_FACILITY): Payer: Medicare Other | Admitting: Internal Medicine

## 2016-10-18 ENCOUNTER — Encounter: Payer: Self-pay | Admitting: Internal Medicine

## 2016-10-18 DIAGNOSIS — Z8673 Personal history of transient ischemic attack (TIA), and cerebral infarction without residual deficits: Secondary | ICD-10-CM

## 2016-10-18 DIAGNOSIS — M1009 Idiopathic gout, multiple sites: Secondary | ICD-10-CM

## 2016-10-18 DIAGNOSIS — I69359 Hemiplegia and hemiparesis following cerebral infarction affecting unspecified side: Secondary | ICD-10-CM | POA: Diagnosis not present

## 2016-10-18 DIAGNOSIS — I10 Essential (primary) hypertension: Secondary | ICD-10-CM | POA: Diagnosis not present

## 2016-10-18 DIAGNOSIS — M62838 Other muscle spasm: Secondary | ICD-10-CM

## 2016-10-18 NOTE — Progress Notes (Signed)
Patient ID: Randall Mccall, male   DOB: 1971/05/20, 46 y.o.   MRN: [id number]    DATE:  10/18/2016  Location:    [facility] Room Number: 126 B Place of Service: SNF (31)   Extended Emergency Contact Information Primary Emergency Contact: Cheeks,Esther Address: [address]          [place], [place] [address] [place] Phone: [phone] Relation: Mother  Advanced Directive information Does Patient Have a Medical Advance Directive?: No, Would patient like information on creating a medical advance directive?: No - Patient declined  Chief Complaint  Patient presents with  . Medical Management of Chronic Issues    Routine Visit    HPI:  46 yo short term rehab male seen today for f/u recent stroke and gout flare. No further gout flares. He reports pain 6-7/10 on scale in muscle at right iliac crest. He fell when he had stroke onto right side but denies striking any objects. No numbness or tingling. Pain worse after ambulating but improves with rest. No hip stiffness. He is tolerating therapy. No falls since admission. Appetite ok. Sleeps well. No nursing concerns.  Recent  ischemic stroke - has RUE hemiplegia - stable on ASA 325 mg daily    Hypertension - stable on norvasc 10 mg daily; clonidine 0.1 mg twice daily  Dyslipidemia - no issues on lipitor 80 mg daily. LDL 159  Gout - no flares since admission on allopurinol 100 mg daily and colchicine 0.6 mg daily   Hx Cocaine abuse - (+) UDS on hospital admission. He states he has no desire to continue/resume use.  Past Medical History:  Diagnosis Date  . Gout   . Hypertension   . Muscle weakness     Past Surgical History:  Procedure Laterality Date  . KNEE SURGERY      Patient Care Team: Gavin Pound, MD as PCP - General (Internal Medicine)  Social History   Social History  . Marital status: Single    Spouse name: N/A  . Number of children: 0  . Years of education: 12   Occupational  History  .      Disabled   Social History Main Topics  . Smoking status: Never Smoker  . Smokeless tobacco: Never Used  . Alcohol use 0.6 oz/week    1 Cans of beer per week  . Drug use: No  . Sexual activity: Not on file   Other Topics Concern  . Not on file   Social History Narrative   Patient lives alone and he is single.   Right handed.   Caffeine-    Education- 12 th grade   Disabled.     reports that he has never smoked. He has never used smokeless tobacco. He reports that he drinks about 0.6 oz of alcohol per week . He reports that he does not use drugs.  History reviewed. No pertinent family history. Family Status  Relation Status  . Mother Alive  . Father Deceased    Immunization History  Administered Date(s) Administered  . Tdap 05/11/2012    No Known Allergies  Medications: Patient's Medications  New Prescriptions   No medications on file  Previous Medications   ALLOPURINOL (ZYLOPRIM) 100 MG TABLET    Take 100 mg by mouth daily.   AMLODIPINE (NORVASC) 10 MG TABLET    Take 1 tablet (10 mg total) by mouth daily.   ASPIRIN 325 MG TABLET    Take 1 tablet (325  mg total) by mouth daily.   ATORVASTATIN (LIPITOR) 80 MG TABLET    Take 1 tablet (80 mg total) by mouth daily at 6 PM.   CLONIDINE (CATAPRES) 0.1 MG TABLET    Take 1 tablet (0.1 mg total) by mouth 2 (two) times daily.   COLCHICINE 0.6 MG TABLET    Take 0.6 mg by mouth daily.   OXYCODONE-ACETAMINOPHEN (PERCOCET/ROXICET) 5-325 MG TABLET    Take 1 tablet by mouth every 8 (eight) hours as needed for severe pain.   SENNA-DOCUSATE (SENOKOT-S) 8.6-50 MG TABLET    Take 1 tablet by mouth at bedtime as needed for mild constipation.  Modified Medications   No medications on file  Discontinued Medications   No medications on file    Review of Systems  Musculoskeletal: Positive for gait problem.  Neurological: Positive for weakness.  All other systems reviewed and are negative.   Vitals:   10/18/16 1159    BP: 128/82  Pulse: 64  Resp: 18  Temp: 97.8 F (36.6 C)  TempSrc: Oral  SpO2: 98%  Weight: 215 lb 9.6 oz (97.8 kg)  Height: '6\' 1"'  (1.854 m)   Body mass index is 28.44 kg/m.  Physical Exam  Constitutional: He is oriented to person, place, and time. He appears well-developed and well-nourished.  Looks well sitting in w/c in NAD, speech slurred  HENT:  Mouth/Throat: Oropharynx is clear and moist. No oropharyngeal exudate.  Eyes: Pupils are equal, round, and reactive to light. No scleral icterus.  Neck: Neck supple. Carotid bruit is not present. No thyromegaly present.  Cardiovascular: Normal rate, regular rhythm, normal heart sounds and intact distal pulses.  Exam reveals no gallop and no friction rub.   No murmur heard. no distal LE swelling. No calf TTP. RUE swelling distally  Pulmonary/Chest: Effort normal and breath sounds normal. He has no wheezes. He has no rales. He exhibits no tenderness.  Abdominal: Soft. Bowel sounds are normal. He exhibits no distension, no abdominal bruit, no pulsatile midline mass and no mass. There is no hepatomegaly. There is no tenderness. There is no rebound and no guarding.  Musculoskeletal: He exhibits edema and tenderness (right iliac crest muscles).  No piriformis, ASIS, right greater trochanteric TTP  Lymphadenopathy:    He has no cervical adenopathy.  Neurological: He is alert and oriented to person, place, and time. A cranial nerve deficit (right facial droop; right upper eyelid palsy) is present.  Right upper and lower extremity hemiparesis; gait unsteady  Skin: Skin is warm and dry. No rash noted.  Psychiatric: He has a normal mood and affect. His behavior is normal. Thought content normal.     Labs reviewed: Admission on 09/15/2016, Discharged on 09/17/2016  Component Date Value Ref Range Status  . Prothrombin Time 09/15/2016 12.0  11.4 - 15.2 seconds Final  . INR 09/15/2016 0.89   Final  . aPTT 09/15/2016 33  24 - 36 seconds Final   . WBC 09/15/2016 7.5  4.0 - 10.5 K/uL Final  . RBC 09/15/2016 5.22  4.22 - 5.81 MIL/uL Final  . Hemoglobin 09/15/2016 16.1  13.0 - 17.0 g/dL Final  . HCT 09/15/2016 47.9  39.0 - 52.0 % Final  . MCV 09/15/2016 91.8  78.0 - 100.0 fL Final  . MCH 09/15/2016 30.8  26.0 - 34.0 pg Final  . MCHC 09/15/2016 33.6  30.0 - 36.0 g/dL Final  . RDW 09/15/2016 15.4  11.5 - 15.5 % Final  . Platelets 09/15/2016 313  150 - 400  K/uL Final  . Neutrophils Relative % 09/15/2016 52  % Final  . Neutro Abs 09/15/2016 3.8  1.7 - 7.7 K/uL Final  . Lymphocytes Relative 09/15/2016 37  % Final  . Lymphs Abs 09/15/2016 2.7  0.7 - 4.0 K/uL Final  . Monocytes Relative 09/15/2016 7  % Final  . Monocytes Absolute 09/15/2016 0.6  0.1 - 1.0 K/uL Final  . Eosinophils Relative 09/15/2016 4  % Final  . Eosinophils Absolute 09/15/2016 0.3  0.0 - 0.7 K/uL Final  . Basophils Relative 09/15/2016 0  % Final  . Basophils Absolute 09/15/2016 0.0  0.0 - 0.1 K/uL Final  . Sodium 09/15/2016 136  135 - 145 mmol/L Final  . Potassium 09/15/2016 3.7  3.5 - 5.1 mmol/L Final  . Chloride 09/15/2016 102  101 - 111 mmol/L Final  . CO2 09/15/2016 20* 22 - 32 mmol/L Final  . Glucose, Bld 09/15/2016 107* 65 - 99 mg/dL Final  . BUN 09/15/2016 16  6 - 20 mg/dL Final  . Creatinine, Ser 09/15/2016 1.27* 0.61 - 1.24 mg/dL Final  . Calcium 09/15/2016 9.5  8.9 - 10.3 mg/dL Final  . Total Protein 09/15/2016 8.0  6.5 - 8.1 g/dL Final  . Albumin 09/15/2016 4.5  3.5 - 5.0 g/dL Final  . AST 09/15/2016 37  15 - 41 U/L Final  . ALT 09/15/2016 21  17 - 63 U/L Final  . Alkaline Phosphatase 09/15/2016 85  38 - 126 U/L Final  . Total Bilirubin 09/15/2016 0.5  0.3 - 1.2 mg/dL Final  . GFR calc non Af Amer 09/15/2016 >60  >60 mL/min Final  . GFR calc Af Amer 09/15/2016 >60  >60 mL/min Final   Comment: (NOTE) The eGFR has been calculated using the CKD EPI equation. This calculation has not been validated in all clinical situations. eGFR's persistently <60  mL/min signify possible Chronic Kidney Disease.   . Anion gap 09/15/2016 14  5 - 15 Final  . Troponin i, poc 09/15/2016 0.01  0.00 - 0.08 ng/mL Final  . Comment 3 09/15/2016          Final   Comment: Due to the release kinetics of cTnI, a negative result within the first hours of the onset of symptoms does not rule out myocardial infarction with certainty. If myocardial infarction is still suspected, repeat the test at appropriate intervals.   . Sodium 09/15/2016 141  135 - 145 mmol/L Final  . Potassium 09/15/2016 3.9  3.5 - 5.1 mmol/L Final  . Chloride 09/15/2016 104  101 - 111 mmol/L Final  . BUN 09/15/2016 20  6 - 20 mg/dL Final  . Creatinine, Ser 09/15/2016 1.30* 0.61 - 1.24 mg/dL Final  . Glucose, Bld 09/15/2016 108* 65 - 99 mg/dL Final  . Calcium, Ion 09/15/2016 1.15  1.15 - 1.40 mmol/L Final  . TCO2 09/15/2016 26  0 - 100 mmol/L Final  . Hemoglobin 09/15/2016 17.3* 13.0 - 17.0 g/dL Final  . HCT 09/15/2016 51.0  39.0 - 52.0 % Final  . Hgb A1c MFr Bld 09/16/2016 5.8* 4.8 - 5.6 % Final   Comment: (NOTE)         Pre-diabetes: 5.7 - 6.4         Diabetes: >6.4         Glycemic control for adults with diabetes: <7.0   . Mean Plasma Glucose 09/16/2016 120  mg/dL Final   Comment: (NOTE) Performed At: [facility] [place], [place] [id number] Lindon Romp MD  [id number]   . Cholesterol 09/16/2016 235* 0 - 200 mg/dL Final  . Triglycerides 09/16/2016 73  <150 mg/dL Final  . HDL 09/16/2016 61  >40 mg/dL Final  . Total CHOL/HDL Ratio 09/16/2016 3.9  RATIO Final  . VLDL 09/16/2016 15  0 - 40 mg/dL Final  . LDL Cholesterol 09/16/2016 159* 0 - 99 mg/dL Final   Comment:        Total Cholesterol/HDL:CHD Risk Coronary Heart Disease Risk Table                     Men   Women  1/2 Average Risk   3.4   3.3  Average Risk       5.0   4.4  2 X Average Risk   9.6   7.1  3 X Average Risk  23.4   11.0        Use the calculated Patient Ratio above and the CHD  Risk Table to determine the patient's CHD Risk.        ATP III CLASSIFICATION (LDL):  <100     mg/dL   Optimal  100-129  mg/dL   Near or Above                    Optimal  130-159  mg/dL   Borderline  160-189  mg/dL   High  >190     mg/dL   Very High   . Weight 09/16/2016 3913.6  oz Final  . Height 09/16/2016 73  in Final  . BP 09/16/2016 144/112  mmHg Final  . Opiates 09/16/2016 NONE DETECTED  NONE DETECTED Final  . Cocaine 09/16/2016 POSITIVE* NONE DETECTED Final  . Benzodiazepines 09/16/2016 NONE DETECTED  NONE DETECTED Final  . Amphetamines 09/16/2016 NONE DETECTED  NONE DETECTED Final  . Tetrahydrocannabinol 09/16/2016 NONE DETECTED  NONE DETECTED Final  . Barbiturates 09/16/2016 NONE DETECTED  NONE DETECTED Final   Comment:        DRUG SCREEN FOR MEDICAL PURPOSES ONLY.  IF CONFIRMATION IS NEEDED FOR ANY PURPOSE, NOTIFY LAB WITHIN 5 DAYS.        LOWEST DETECTABLE LIMITS FOR URINE DRUG SCREEN Drug Class       Cutoff (ng/mL) Amphetamine      1000 Barbiturate      200 Benzodiazepine   325 Tricyclics       498 Opiates          300 Cocaine          300 THC              50   Admission on 07/19/2016, Discharged on 07/19/2016  Component Date Value Ref Range Status  . WBC 07/19/2016 10.8* 4.0 - 10.5 K/uL Final  . RBC 07/19/2016 4.61  4.22 - 5.81 MIL/uL Final  . Hemoglobin 07/19/2016 14.3  13.0 - 17.0 g/dL Final  . HCT 07/19/2016 41.4  39.0 - 52.0 % Final  . MCV 07/19/2016 89.8  78.0 - 100.0 fL Final  . MCH 07/19/2016 31.0  26.0 - 34.0 pg Final  . MCHC 07/19/2016 34.5  30.0 - 36.0 g/dL Final  . RDW 07/19/2016 12.6  11.5 - 15.5 % Final  . Platelets 07/19/2016 485* 150 - 400 K/uL Final  . Sodium 07/19/2016 133* 135 - 145 mmol/L Final  . Potassium 07/19/2016 4.0  3.5 - 5.1 mmol/L Final  . Chloride 07/19/2016 100* 101 - 111 mmol/L Final  . CO2 07/19/2016 24  22 - 32 mmol/L  Final  . Glucose, Bld 07/19/2016 120* 65 - 99 mg/dL Final  . BUN 07/19/2016 16  6 - 20 mg/dL Final  .  Creatinine, Ser 07/19/2016 1.08  0.61 - 1.24 mg/dL Final  . Calcium 07/19/2016 9.6  8.9 - 10.3 mg/dL Final  . GFR calc non Af Amer 07/19/2016 >60  >60 mL/min Final  . GFR calc Af Amer 07/19/2016 >60  >60 mL/min Final   Comment: (NOTE) The eGFR has been calculated using the CKD EPI equation. This calculation has not been validated in all clinical situations. eGFR's persistently <60 mL/min signify possible Chronic Kidney Disease.   . Anion gap 07/19/2016 9  5 - 15 Final    No results found.   Assessment/Plan   ICD-9-CM ICD-10-CM   1. Muscle spasm 728.85 M62.838    right iliac crest  2. Hemiparesis due to recent stroke ([place]) 438.20 I69.359   3. History of recent stroke V12.54 Z86.73   4. Idiopathic gout of multiple sites, unspecified chronicity 274.00 M10.09   5. Essential hypertension 401.9 I10     Check pelvic xray +/- hip xray  Cont current meds as ordered  F/u with specialists as scheduled  Cont PT/OT/ST as ordered  Will follow   Monica S. Perlie Gold  [facility] [address] [place], [place] [address] [phone] Cell (Monday-Friday 8 AM - 5 PM) [phone] After 5 PM and follow prompts

## 2016-10-24 ENCOUNTER — Non-Acute Institutional Stay (SKILLED_NURSING_FACILITY): Payer: Medicare Other | Admitting: Adult Health

## 2016-10-24 ENCOUNTER — Encounter: Payer: Self-pay | Admitting: Adult Health

## 2016-10-24 DIAGNOSIS — F1410 Cocaine abuse, uncomplicated: Secondary | ICD-10-CM

## 2016-10-24 DIAGNOSIS — I69331 Monoplegia of upper limb following cerebral infarction affecting right dominant side: Secondary | ICD-10-CM | POA: Diagnosis not present

## 2016-10-24 DIAGNOSIS — I639 Cerebral infarction, unspecified: Secondary | ICD-10-CM | POA: Diagnosis not present

## 2016-10-24 DIAGNOSIS — R531 Weakness: Secondary | ICD-10-CM | POA: Diagnosis not present

## 2016-10-24 NOTE — Progress Notes (Signed)
Location:   [facility] Room Number: 126 B Place of Service:  SNF (31)    CODE STATUS: Full Code  No Known Allergies  Chief Complaint  Patient presents with  . Discharge Note    HPI:  He is being discharged to home with home health for pt/ot. He will need a quad cane; 3:1 commode and shower chair. He will need his prescriptions written and will need to follow up with his medical provider He had been hospitalized for an acute cva. He was admitted to this facility for short term rehab and is ready for discharge home.    Past Medical History:  Diagnosis Date  . Gout   . Hypertension   . Muscle weakness     Past Surgical History:  Procedure Laterality Date  . KNEE SURGERY      Social History   Social History  . Marital status: Single    Spouse name: N/A  . Number of children: 0  . Years of education: 12   Occupational History  .      Disabled   Social History Main Topics  . Smoking status: Never Smoker  . Smokeless tobacco: Never Used  . Alcohol use 0.6 oz/week    1 Cans of beer per week  . Drug use: No  . Sexual activity: Not on file   Other Topics Concern  . Not on file   Social History Narrative   Patient lives alone and he is single.   Right handed.   Caffeine-    Education- 12 th grade   Disabled.   History reviewed. No pertinent family history.  VITAL SIGNS BP 128/82   Pulse 64   Temp 97.8 F (36.6 C)   Resp 18   Ht 6\' 1"  (1.854 m)   Wt 215 lb 9.6 oz (97.8 kg)   SpO2 98%   BMI 28.44 kg/m   Patient's Medications  New Prescriptions   No medications on file  Previous Medications   ALLOPURINOL (ZYLOPRIM) 100 MG TABLET    Take 100 mg by mouth daily.   AMLODIPINE (NORVASC) 10 MG TABLET    Take 1 tablet (10 mg total) by mouth daily.   ASPIRIN 325 MG TABLET    Take 1 tablet (325 mg total) by mouth daily.   ATORVASTATIN (LIPITOR) 80 MG TABLET    Take 1 tablet (80 mg total) by mouth daily at 6 PM.   CLONIDINE (CATAPRES) 0.1 MG  TABLET    Take 1 tablet (0.1 mg total) by mouth 2 (two) times daily.   COLCHICINE 0.6 MG TABLET    Take 0.6 mg by mouth daily.   OXYCODONE-ACETAMINOPHEN (PERCOCET/ROXICET) 5-325 MG TABLET    Take 1 tablet by mouth every 8 (eight) hours as needed for severe pain.   SENNA-DOCUSATE (SENOKOT-S) 8.6-50 MG TABLET    Take 1 tablet by mouth at bedtime as needed for mild constipation.  Modified Medications   No medications on file  Discontinued Medications   No medications on file     SIGNIFICANT DIAGNOSTIC EXAMS  09-15-16: ct of head: 1. Acute to subacute appearing LEFT lenticulostriate lacunar infarct.   Mild atherosclerosis. Mild to moderate white matter changes most consistent with chronic small vessel ischemic disease, advanced for age.   2. ASPECTS is 9.  09-15-16: ct angio of head and neck: 1. High-grade atherosclerotic stenosis versus nonocclusive thrombus at the left MCA bifurcation, predominantly involving the M2 superior division. 2. Moderate right cavernous ICA stenosis. 3.  No cervical carotid artery or vertebral artery stenosis.  09-16-16: MRA/MRI of brain 1. Subcentimeter focus of acute infarction within the left posterior limb of internal capsule. No acute hemorrhage identified. 2. Nonspecific foci of T2 FLAIR hyperintense signal abnormality in white matter probably represents advanced chronic microvascular ischemic changes for patient's age. Chronic lacunar infarct within the left caudate head. 3. Patent circle of Willis without evidence for high-grade stenosis, large vessel occlusion or aneurysm. 4. Long segment of mild-to-moderate stenosis of left M2 prox superior division, less conspicuous when compared with prior CT angiogram of the head.  09-16-16:  2-d echo: Left ventricle: Wall thickness was increased increased in a   pattern of mild to moderate LVH. There was mild focal basal   hypertrophy of the septum. Systolic function was normal. The   estimated ejection  fraction was in the range of 60% to 65%. Wall   motion was normal; there were no regional wall motion   abnormalities. Doppler parameters are consistent with abnormal   left ventricular relaxation (grade 1 diastolic dysfunction).     LABS REVIEWED:   09-15-16: wbc 7.5; hgb 16.2; hct 47.9; mcv 91.8; plt 313; glucose 107; bun 16; creat 1.27; k+ 3.7; na++ 136; liver normal albumin 4.5 09-16-16: hgb a1c 5.8; chol 235; ldl 159; trig 73 hdl 61; urine drug screen: + cocaine      Review of Systems  Constitutional: Negative for malaise/fatigue.  Respiratory: Negative for cough and shortness of breath.   Cardiovascular: Negative for chest pain, palpitations and leg swelling.  Gastrointestinal: Negative for abdominal pain, constipation and heartburn.  Musculoskeletal: Negative for back pain, joint pain and myalgias.  Skin: Negative.   Neurological: Negative for dizziness.       Has right sided weakness   Psychiatric/Behavioral: The patient is not nervous/anxious.     Physical Exam  Constitutional: He is oriented to person, place, and time. He appears well-developed and well-nourished. No distress.  Overweight   Eyes: Conjunctivae are normal.  Neck: Neck supple. No JVD present. No thyromegaly present.  Cardiovascular: Normal rate, regular rhythm and intact distal pulses.   Respiratory: Effort normal and breath sounds normal. No respiratory distress. He has no wheezes.  GI: Soft. Bowel sounds are normal. He exhibits no distension. There is no tenderness.  Musculoskeletal: He exhibits no edema.  Has right side weakness present   Lymphadenopathy:    He has no cervical adenopathy.  Neurological: He is alert and oriented to person, place, and time.  Skin: Skin is warm and dry. He is not diaphoretic.  Psychiatric: He has a normal mood and affect.     ASSESSMENT/ PLAN:  Patient is being discharged with the following home health services:  Pt/ot to evaluate and treat as indicated for gait;  balance; strength; adl training   Patient is being discharged with the following durable medical equipment:  Quad cane; 3:1 commode; shower bench   Patient has been advised to f/u with their PCP in 1-2 weeks to bring them up to date on their rehab stay.  Social services at facility was responsible for arranging this appointment.  Pt was provided with a 30 day supply of prescriptions for medications and refills must be obtained from their PCP.  For controlled substances, a more limited supply may be provided adequate until PCP appointment only. #10 percocet 5/325 mg tabs.    Time spent with patient  45  minutes >50% time spent counseling; reviewing medical record; tests; labs; and developing future plan of  care    Ok Edwards NP [facility]  Contact [phone] Monday through Friday 8am- 5pm  After hours call [phone]

## 2016-10-25 DIAGNOSIS — E785 Hyperlipidemia, unspecified: Secondary | ICD-10-CM | POA: Diagnosis not present

## 2016-10-25 DIAGNOSIS — I6930 Unspecified sequelae of cerebral infarction: Secondary | ICD-10-CM | POA: Diagnosis not present

## 2016-10-25 DIAGNOSIS — I249 Acute ischemic heart disease, unspecified: Secondary | ICD-10-CM | POA: Diagnosis not present

## 2016-10-25 DIAGNOSIS — I10 Essential (primary) hypertension: Secondary | ICD-10-CM | POA: Diagnosis not present

## 2016-10-25 DIAGNOSIS — M1A9XX0 Chronic gout, unspecified, without tophus (tophi): Secondary | ICD-10-CM | POA: Diagnosis not present

## 2016-10-29 DIAGNOSIS — E669 Obesity, unspecified: Secondary | ICD-10-CM | POA: Diagnosis not present

## 2016-10-29 DIAGNOSIS — Z131 Encounter for screening for diabetes mellitus: Secondary | ICD-10-CM | POA: Diagnosis not present

## 2016-10-29 DIAGNOSIS — R269 Unspecified abnormalities of gait and mobility: Secondary | ICD-10-CM | POA: Diagnosis not present

## 2016-10-29 DIAGNOSIS — I10 Essential (primary) hypertension: Secondary | ICD-10-CM | POA: Diagnosis not present

## 2016-10-29 DIAGNOSIS — Z113 Encounter for screening for infections with a predominantly sexual mode of transmission: Secondary | ICD-10-CM | POA: Diagnosis not present

## 2016-10-29 DIAGNOSIS — Z8673 Personal history of transient ischemic attack (TIA), and cerebral infarction without residual deficits: Secondary | ICD-10-CM | POA: Diagnosis not present

## 2016-10-29 DIAGNOSIS — H539 Unspecified visual disturbance: Secondary | ICD-10-CM | POA: Diagnosis not present

## 2016-10-29 DIAGNOSIS — Z125 Encounter for screening for malignant neoplasm of prostate: Secondary | ICD-10-CM | POA: Diagnosis not present

## 2016-10-29 DIAGNOSIS — Z0110 Encounter for examination of ears and hearing without abnormal findings: Secondary | ICD-10-CM | POA: Diagnosis not present

## 2016-10-31 DIAGNOSIS — M6281 Muscle weakness (generalized): Secondary | ICD-10-CM | POA: Diagnosis not present

## 2016-10-31 DIAGNOSIS — I10 Essential (primary) hypertension: Secondary | ICD-10-CM | POA: Diagnosis not present

## 2016-10-31 DIAGNOSIS — I249 Acute ischemic heart disease, unspecified: Secondary | ICD-10-CM | POA: Diagnosis not present

## 2016-10-31 DIAGNOSIS — I69951 Hemiplegia and hemiparesis following unspecified cerebrovascular disease affecting right dominant side: Secondary | ICD-10-CM | POA: Diagnosis not present

## 2016-10-31 DIAGNOSIS — R278 Other lack of coordination: Secondary | ICD-10-CM | POA: Diagnosis not present

## 2016-11-05 DIAGNOSIS — M6281 Muscle weakness (generalized): Secondary | ICD-10-CM | POA: Diagnosis not present

## 2016-11-05 DIAGNOSIS — I10 Essential (primary) hypertension: Secondary | ICD-10-CM | POA: Diagnosis not present

## 2016-11-05 DIAGNOSIS — R278 Other lack of coordination: Secondary | ICD-10-CM | POA: Diagnosis not present

## 2016-11-05 DIAGNOSIS — I249 Acute ischemic heart disease, unspecified: Secondary | ICD-10-CM | POA: Diagnosis not present

## 2016-11-05 DIAGNOSIS — I69951 Hemiplegia and hemiparesis following unspecified cerebrovascular disease affecting right dominant side: Secondary | ICD-10-CM | POA: Diagnosis not present

## 2016-11-07 DIAGNOSIS — Z125 Encounter for screening for malignant neoplasm of prostate: Secondary | ICD-10-CM | POA: Diagnosis not present

## 2016-11-07 DIAGNOSIS — I69951 Hemiplegia and hemiparesis following unspecified cerebrovascular disease affecting right dominant side: Secondary | ICD-10-CM | POA: Diagnosis not present

## 2016-11-07 DIAGNOSIS — G894 Chronic pain syndrome: Secondary | ICD-10-CM | POA: Diagnosis not present

## 2016-11-07 DIAGNOSIS — M6281 Muscle weakness (generalized): Secondary | ICD-10-CM | POA: Diagnosis not present

## 2016-11-07 DIAGNOSIS — R278 Other lack of coordination: Secondary | ICD-10-CM | POA: Diagnosis not present

## 2016-11-07 DIAGNOSIS — I10 Essential (primary) hypertension: Secondary | ICD-10-CM | POA: Diagnosis not present

## 2016-11-07 DIAGNOSIS — I249 Acute ischemic heart disease, unspecified: Secondary | ICD-10-CM | POA: Diagnosis not present

## 2016-11-07 DIAGNOSIS — M1009 Idiopathic gout, multiple sites: Secondary | ICD-10-CM | POA: Diagnosis not present

## 2016-11-07 DIAGNOSIS — Z8673 Personal history of transient ischemic attack (TIA), and cerebral infarction without residual deficits: Secondary | ICD-10-CM | POA: Diagnosis not present

## 2016-11-09 DIAGNOSIS — M6281 Muscle weakness (generalized): Secondary | ICD-10-CM | POA: Diagnosis not present

## 2016-11-09 DIAGNOSIS — I249 Acute ischemic heart disease, unspecified: Secondary | ICD-10-CM | POA: Diagnosis not present

## 2016-11-09 DIAGNOSIS — I10 Essential (primary) hypertension: Secondary | ICD-10-CM | POA: Diagnosis not present

## 2016-11-09 DIAGNOSIS — R278 Other lack of coordination: Secondary | ICD-10-CM | POA: Diagnosis not present

## 2016-11-09 DIAGNOSIS — I69951 Hemiplegia and hemiparesis following unspecified cerebrovascular disease affecting right dominant side: Secondary | ICD-10-CM | POA: Diagnosis not present

## 2016-11-12 DIAGNOSIS — M6281 Muscle weakness (generalized): Secondary | ICD-10-CM | POA: Diagnosis not present

## 2016-11-12 DIAGNOSIS — I249 Acute ischemic heart disease, unspecified: Secondary | ICD-10-CM | POA: Diagnosis not present

## 2016-11-12 DIAGNOSIS — R278 Other lack of coordination: Secondary | ICD-10-CM | POA: Diagnosis not present

## 2016-11-12 DIAGNOSIS — I10 Essential (primary) hypertension: Secondary | ICD-10-CM | POA: Diagnosis not present

## 2016-11-12 DIAGNOSIS — I69951 Hemiplegia and hemiparesis following unspecified cerebrovascular disease affecting right dominant side: Secondary | ICD-10-CM | POA: Diagnosis not present

## 2016-11-15 DIAGNOSIS — M6281 Muscle weakness (generalized): Secondary | ICD-10-CM | POA: Diagnosis not present

## 2016-11-15 DIAGNOSIS — I69951 Hemiplegia and hemiparesis following unspecified cerebrovascular disease affecting right dominant side: Secondary | ICD-10-CM | POA: Diagnosis not present

## 2016-11-15 DIAGNOSIS — I249 Acute ischemic heart disease, unspecified: Secondary | ICD-10-CM | POA: Diagnosis not present

## 2016-11-15 DIAGNOSIS — I10 Essential (primary) hypertension: Secondary | ICD-10-CM | POA: Diagnosis not present

## 2016-11-15 DIAGNOSIS — R278 Other lack of coordination: Secondary | ICD-10-CM | POA: Diagnosis not present

## 2016-11-19 DIAGNOSIS — I69951 Hemiplegia and hemiparesis following unspecified cerebrovascular disease affecting right dominant side: Secondary | ICD-10-CM | POA: Diagnosis not present

## 2016-11-19 DIAGNOSIS — M6281 Muscle weakness (generalized): Secondary | ICD-10-CM | POA: Diagnosis not present

## 2016-11-19 DIAGNOSIS — I249 Acute ischemic heart disease, unspecified: Secondary | ICD-10-CM | POA: Diagnosis not present

## 2016-11-19 DIAGNOSIS — I10 Essential (primary) hypertension: Secondary | ICD-10-CM | POA: Diagnosis not present

## 2016-11-19 DIAGNOSIS — R278 Other lack of coordination: Secondary | ICD-10-CM | POA: Diagnosis not present

## 2016-11-26 DIAGNOSIS — I69951 Hemiplegia and hemiparesis following unspecified cerebrovascular disease affecting right dominant side: Secondary | ICD-10-CM | POA: Diagnosis not present

## 2016-11-26 DIAGNOSIS — R278 Other lack of coordination: Secondary | ICD-10-CM | POA: Diagnosis not present

## 2016-11-26 DIAGNOSIS — I10 Essential (primary) hypertension: Secondary | ICD-10-CM | POA: Diagnosis not present

## 2016-11-26 DIAGNOSIS — I249 Acute ischemic heart disease, unspecified: Secondary | ICD-10-CM | POA: Diagnosis not present

## 2016-11-26 DIAGNOSIS — M6281 Muscle weakness (generalized): Secondary | ICD-10-CM | POA: Diagnosis not present

## 2016-11-28 DIAGNOSIS — I249 Acute ischemic heart disease, unspecified: Secondary | ICD-10-CM | POA: Diagnosis not present

## 2016-11-28 DIAGNOSIS — I69951 Hemiplegia and hemiparesis following unspecified cerebrovascular disease affecting right dominant side: Secondary | ICD-10-CM | POA: Diagnosis not present

## 2016-11-28 DIAGNOSIS — M6281 Muscle weakness (generalized): Secondary | ICD-10-CM | POA: Diagnosis not present

## 2016-11-28 DIAGNOSIS — I10 Essential (primary) hypertension: Secondary | ICD-10-CM | POA: Diagnosis not present

## 2016-11-28 DIAGNOSIS — R278 Other lack of coordination: Secondary | ICD-10-CM | POA: Diagnosis not present

## 2016-12-11 DIAGNOSIS — I10 Essential (primary) hypertension: Secondary | ICD-10-CM | POA: Diagnosis not present

## 2016-12-11 DIAGNOSIS — M1009 Idiopathic gout, multiple sites: Secondary | ICD-10-CM | POA: Diagnosis not present

## 2016-12-11 DIAGNOSIS — R7303 Prediabetes: Secondary | ICD-10-CM | POA: Diagnosis not present

## 2016-12-11 DIAGNOSIS — Z8673 Personal history of transient ischemic attack (TIA), and cerebral infarction without residual deficits: Secondary | ICD-10-CM | POA: Diagnosis not present

## 2016-12-11 DIAGNOSIS — Z0000 Encounter for general adult medical examination without abnormal findings: Secondary | ICD-10-CM | POA: Diagnosis not present

## 2016-12-18 DIAGNOSIS — N189 Chronic kidney disease, unspecified: Secondary | ICD-10-CM | POA: Insufficient documentation

## 2017-02-12 DIAGNOSIS — R7303 Prediabetes: Secondary | ICD-10-CM | POA: Diagnosis not present

## 2017-02-12 DIAGNOSIS — Z0000 Encounter for general adult medical examination without abnormal findings: Secondary | ICD-10-CM | POA: Diagnosis not present

## 2017-02-12 DIAGNOSIS — E785 Hyperlipidemia, unspecified: Secondary | ICD-10-CM | POA: Diagnosis not present

## 2017-02-12 DIAGNOSIS — I10 Essential (primary) hypertension: Secondary | ICD-10-CM | POA: Diagnosis not present

## 2017-02-12 DIAGNOSIS — Z87898 Personal history of other specified conditions: Secondary | ICD-10-CM | POA: Diagnosis not present

## 2017-03-12 DIAGNOSIS — R05 Cough: Secondary | ICD-10-CM | POA: Diagnosis not present

## 2017-03-12 DIAGNOSIS — I10 Essential (primary) hypertension: Secondary | ICD-10-CM | POA: Diagnosis not present

## 2017-03-12 DIAGNOSIS — E785 Hyperlipidemia, unspecified: Secondary | ICD-10-CM | POA: Diagnosis not present

## 2017-05-14 DIAGNOSIS — E785 Hyperlipidemia, unspecified: Secondary | ICD-10-CM | POA: Diagnosis not present

## 2017-05-14 DIAGNOSIS — Z8673 Personal history of transient ischemic attack (TIA), and cerebral infarction without residual deficits: Secondary | ICD-10-CM | POA: Diagnosis not present

## 2017-05-14 DIAGNOSIS — M1009 Idiopathic gout, multiple sites: Secondary | ICD-10-CM | POA: Diagnosis not present

## 2017-05-14 DIAGNOSIS — I10 Essential (primary) hypertension: Secondary | ICD-10-CM | POA: Diagnosis not present

## 2017-06-25 DIAGNOSIS — M1009 Idiopathic gout, multiple sites: Secondary | ICD-10-CM | POA: Diagnosis not present

## 2017-06-25 DIAGNOSIS — Z23 Encounter for immunization: Secondary | ICD-10-CM | POA: Diagnosis not present

## 2017-06-25 DIAGNOSIS — I10 Essential (primary) hypertension: Secondary | ICD-10-CM | POA: Diagnosis not present

## 2017-06-25 DIAGNOSIS — E785 Hyperlipidemia, unspecified: Secondary | ICD-10-CM | POA: Diagnosis not present

## 2017-06-25 DIAGNOSIS — R7303 Prediabetes: Secondary | ICD-10-CM | POA: Diagnosis not present

## 2017-07-16 DIAGNOSIS — M1009 Idiopathic gout, multiple sites: Secondary | ICD-10-CM | POA: Diagnosis not present

## 2017-07-16 DIAGNOSIS — R7303 Prediabetes: Secondary | ICD-10-CM | POA: Diagnosis not present

## 2017-08-09 DIAGNOSIS — R0609 Other forms of dyspnea: Secondary | ICD-10-CM | POA: Diagnosis not present

## 2017-08-09 DIAGNOSIS — I10 Essential (primary) hypertension: Secondary | ICD-10-CM | POA: Diagnosis not present

## 2017-10-16 ENCOUNTER — Emergency Department (HOSPITAL_COMMUNITY)
Admission: EM | Admit: 2017-10-16 | Discharge: 2017-10-16 | Disposition: A | Discharge status: Home / Self Care | Payer: Medicare Other | Attending: Emergency Medicine | Admitting: Emergency Medicine

## 2017-10-16 ENCOUNTER — Encounter (HOSPITAL_COMMUNITY): Payer: Self-pay | Admitting: Emergency Medicine

## 2017-10-16 ENCOUNTER — Other Ambulatory Visit: Payer: Self-pay

## 2017-10-16 DIAGNOSIS — N492 Inflammatory disorders of scrotum: Secondary | ICD-10-CM | POA: Diagnosis not present

## 2017-10-16 DIAGNOSIS — Z79899 Other long term (current) drug therapy: Secondary | ICD-10-CM | POA: Insufficient documentation

## 2017-10-16 DIAGNOSIS — M25532 Pain in left wrist: Secondary | ICD-10-CM

## 2017-10-16 DIAGNOSIS — I69331 Monoplegia of upper limb following cerebral infarction affecting right dominant side: Secondary | ICD-10-CM | POA: Insufficient documentation

## 2017-10-16 DIAGNOSIS — M109 Gout, unspecified: Secondary | ICD-10-CM | POA: Diagnosis not present

## 2017-10-16 DIAGNOSIS — N189 Chronic kidney disease, unspecified: Secondary | ICD-10-CM | POA: Insufficient documentation

## 2017-10-16 DIAGNOSIS — I129 Hypertensive chronic kidney disease with stage 1 through stage 4 chronic kidney disease, or unspecified chronic kidney disease: Secondary | ICD-10-CM | POA: Diagnosis not present

## 2017-10-16 HISTORY — DX: Cerebral infarction, unspecified: I63.9

## 2017-10-16 LAB — URIC ACID: Uric Acid, Serum: 7.8 mg/dL — ABNORMAL HIGH (ref 4.4–7.6)

## 2017-10-16 LAB — CBC WITH DIFFERENTIAL/PLATELET
Basophils Absolute: 0 10*3/uL (ref 0.0–0.1)
Basophils Relative: 0 %
Eosinophils Absolute: 0.1 10*3/uL (ref 0.0–0.7)
Eosinophils Relative: 1 %
HCT: 46 % (ref 39.0–52.0)
Hemoglobin: 16 g/dL (ref 13.0–17.0)
Lymphocytes Relative: 10 %
Lymphs Abs: 1.1 10*3/uL (ref 0.7–4.0)
MCH: 31.4 pg (ref 26.0–34.0)
MCHC: 34.8 g/dL (ref 30.0–36.0)
MCV: 90.2 fL (ref 78.0–100.0)
Monocytes Absolute: 1 10*3/uL (ref 0.1–1.0)
Monocytes Relative: 10 %
Neutro Abs: 8.2 10*3/uL — ABNORMAL HIGH (ref 1.7–7.7)
Neutrophils Relative %: 79 %
Platelets: 376 10*3/uL (ref 150–400)
RBC: 5.1 MIL/uL (ref 4.22–5.81)
RDW: 13.2 % (ref 11.5–15.5)
WBC: 10.4 10*3/uL (ref 4.0–10.5)

## 2017-10-16 LAB — BASIC METABOLIC PANEL
Anion gap: 13 (ref 5–15)
BUN: 13 mg/dL (ref 6–20)
CO2: 19 mmol/L — ABNORMAL LOW (ref 22–32)
Calcium: 9.4 mg/dL (ref 8.9–10.3)
Chloride: 103 mmol/L (ref 101–111)
Creatinine, Ser: 1.17 mg/dL (ref 0.61–1.24)
GFR calc Af Amer: >60 mL/min (ref >60)
GFR calc non Af Amer: >60 mL/min (ref >60)
Glucose, Bld: 108 mg/dL — ABNORMAL HIGH (ref 65–99)
Potassium: 3.9 mmol/L (ref 3.5–5.1)
Sodium: 135 mmol/L (ref 135–145)

## 2017-10-16 LAB — RAPID URINE DRUG SCREEN, HOSP PERFORMED
Amphetamines: NOT DETECTED
Barbiturates: NOT DETECTED
Benzodiazepines: NOT DETECTED
Cocaine: NOT DETECTED
Opiates: NOT DETECTED
Tetrahydrocannabinol: NOT DETECTED

## 2017-10-16 MED ORDER — SODIUM CHLORIDE 0.9 % IV BOLUS (SEPSIS)
500.0000 mL | Freq: Once | INTRAVENOUS | Status: AC
  Administered 2017-10-16: 500 mL via INTRAVENOUS

## 2017-10-16 MED ORDER — ALLOPURINOL 100 MG PO TABS: 100.0000 mg | ORAL_TABLET | Freq: Every day | ORAL | 2 refills | Status: DC

## 2017-10-16 MED ORDER — ONDANSETRON HCL 4 MG/2ML IJ SOLN
4.0000 mg | Freq: Once | INTRAMUSCULAR | Status: AC
  Administered 2017-10-16: 4 mg via INTRAVENOUS
  Filled 2017-10-16: qty 2

## 2017-10-16 MED ORDER — HYDROCODONE-ACETAMINOPHEN 5-325 MG PO TABS: 1.0000 | ORAL_TABLET | ORAL | 0 refills | Status: DC | PRN

## 2017-10-16 MED ORDER — PREDNISONE 20 MG PO TABS: 20.0000 mg | ORAL_TABLET | Freq: Two times a day (BID) | ORAL | 0 refills | Status: DC

## 2017-10-16 MED ORDER — COLCHICINE 0.6 MG PO TABS: 0.6000 mg | ORAL_TABLET | ORAL | 2 refills | Status: DC | PRN

## 2017-10-16 MED ORDER — METHYLPREDNISOLONE SODIUM SUCC 125 MG IJ SOLR
125.0000 mg | Freq: Once | INTRAMUSCULAR | Status: AC
  Administered 2017-10-16: 125 mg via INTRAVENOUS
  Filled 2017-10-16: qty 2

## 2017-10-16 MED ORDER — FENTANYL CITRATE (PF) 100 MCG/2ML IJ SOLN
100.0000 ug | INTRAMUSCULAR | Status: DC | PRN
  Administered 2017-10-16: 100 ug via INTRAVENOUS
  Filled 2017-10-16: qty 2

## 2017-10-16 NOTE — ED Provider Notes (Signed)
[facility] DEPT Provider Note   CSN: [id number] Arrival date & time: 10/16/17  1024     History   Chief Complaint Chief Complaint  Patient presents with  . Leg Pain  . Wrist Pain    HPI Randall Mccall is a 47 y.o. male.  Patient presents for evaluation of pain in left hand and wrist, for several days.  He states he ran out of his gout medicine 2 days ago.  He is debilitated with right-sided weakness from a stroke about a year ago.  He typically uses a walker for ambulation.  He is having trouble using a walker because of the left hand and wrist pain.  He denies headache, neck pain, new weakness or paresthesia.  He denies nausea, vomiting, fever or chills.  There are no other known modifying factors.  HPI  Past Medical History:  Diagnosis Date  . Gout   . Hypertension   . Muscle weakness   . Stroke [facility])     Patient Active Problem List   Diagnosis Date Noted  . CKD (chronic kidney disease) 12/18/2016  . Benign essential HTN   . Idiopathic gout   . Diastolic dysfunction   . Cocaine abuse ([place])   . Right hemiparesis ([place])   . 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 Cerebrovascular accident (CVA) ([place])   . Acute ischemic stroke ([place]) 09/15/2016  . Stroke (cerebrum) ([place]) 09/15/2016  . Hypertension 09/15/2016  . Vitamin B12 deficiency 08/10/2015  . Weakness 07/02/2013  . Abnormal CPK 07/02/2013    Past Surgical History:  Procedure Laterality Date  . KNEE SURGERY         Home Medications    Prior to Admission medications   Medication Sig Start Date End Date Taking? Authorizing Provider  amLODipine (NORVASC) 10 MG tablet Take 1 tablet (10 mg total) by mouth daily. 06/15/16  Yes Elnora Morrison, MD  aspirin 325 MG tablet Take 1 tablet (325 mg total) by mouth daily. 09/18/16  Yes Nita Sells, MD  atorvastatin (LIPITOR) 80 MG tablet Take 1 tablet (80 mg total) by  mouth daily at 6 PM. 09/17/16  Yes Nita Sells, MD  Cholecalciferol (VITAMIN D3) 5000 units TABS Take 1 tablet by mouth daily. 09/25/17  Yes [provider]  cloNIDine (CATAPRES) 0.1 MG tablet Take 1 tablet (0.1 mg total) by mouth 2 (two) times daily. 06/15/16  Yes Elnora Morrison, MD  gabapentin (NEURONTIN) 300 MG capsule Take 300 mg by mouth at bedtime. 10/09/17  Yes [provider]  oxyCODONE-acetaminophen (PERCOCET/ROXICET) 5-325 MG tablet Take 1 tablet by mouth every 8 (eight) hours as needed for severe pain. 09/27/16  Yes Lauree Chandler, NP  senna-docusate (SENOKOT-S) 8.6-50 MG tablet Take 1 tablet by mouth at bedtime as needed for mild constipation. 09/17/16  Yes Nita Sells, MD  allopurinol (ZYLOPRIM) 100 MG tablet Take 1 tablet (100 mg total) by mouth daily. 10/16/17   Daleen Bo, MD  colchicine 0.6 MG tablet Take 1 tablet (0.6 mg total) by mouth every hour as needed. Up to 4 in one 24-hour period, if needed for pain 10/16/17   Daleen Bo, MD  HYDROcodone-acetaminophen [facility]) 5-325 MG tablet Take 1 tablet by mouth every 4 (four) hours as needed. 10/16/17   Daleen Bo, MD  predniSONE (DELTASONE) 20 MG tablet Take 1 tablet (20 mg total) by mouth 2 (two) times daily. 10/16/17   Daleen Bo, MD    Family  History No family history on file.  Social History Social History   Tobacco Use  . Smoking status: Never Smoker  . Smokeless tobacco: Never Used  Substance Use Topics  . Alcohol use: Yes    Alcohol/week: 0.6 oz    Types: 1 Cans of beer per week  . Drug use: No     Allergies   Patient has no known allergies.   Review of Systems Review of Systems  All other systems reviewed and are negative.    Physical Exam Updated Vital Signs BP (!) 166/107   Pulse (!) 102   Temp 98.4 F (36.9 C) (Oral)   Resp 16   Ht 6\' 1"  (1.854 m)   Wt 117.9 kg (260 lb)   SpO2 95%   BMI 34.30 kg/m   Physical Exam  Constitutional: He is oriented  to person, place, and time. He appears well-developed.  Appears older than stated age.  HENT:  Head: Normocephalic and atraumatic.  Right Ear: External ear normal.  Left Ear: External ear normal.  Eyes: Conjunctivae and EOM are normal. Pupils are equal, round, and reactive to light.  Neck: Normal range of motion and phonation normal. Neck supple.  Cardiovascular: Normal rate, regular rhythm and normal heart sounds.  Pulmonary/Chest: Effort normal and breath sounds normal. He exhibits no bony tenderness.  Abdominal: Soft. There is no tenderness.  Musculoskeletal: Normal range of motion.  Moderate tenderness and swelling, left wrist and proximal hand.  Neurovascular intact distally in the fingers of the left hand.  Neurological: He is alert and oriented to person, place, and time. No sensory deficit. He exhibits normal muscle tone.  Mild hemiplegia right arm and leg.  No dysarthria or aphasia.  Skin: Skin is warm, dry and intact.  Psychiatric: He has a normal mood and affect. His behavior is normal. Judgment and thought content normal.  Nursing note and vitals reviewed.    ED Treatments / Results  Labs (all labs ordered are listed, but only abnormal results are displayed) Labs Reviewed  BASIC METABOLIC PANEL - Abnormal; Notable for the following components:      Result Value   CO2 19 (*)    Glucose, Bld 108 (*)    All other components within normal limits  CBC WITH DIFFERENTIAL/PLATELET - Abnormal; Notable for the following components:   Neutro Abs 8.2 (*)    All other components within normal limits  URIC ACID - Abnormal; Notable for the following components:   Uric Acid, Serum 7.8 (*)    All other components within normal limits  RAPID URINE DRUG SCREEN, HOSP PERFORMED    EKG  EKG Interpretation None       Radiology No results found.  Procedures Procedures (including critical care time)  Medications Ordered in ED Medications  sodium chloride 0.9 % bolus 500 mL (0  mLs Intravenous Stopped 10/16/17 1154)  ondansetron (ZOFRAN) injection 4 mg (4 mg Intravenous Given 10/16/17 1131)  methylPREDNISolone sodium succinate (SOLU-MEDROL) 125 mg/2 mL injection 125 mg (125 mg Intravenous Given 10/16/17 1131)     Initial Impression / Assessment and Plan / ED Course  I have reviewed the triage vital signs and the nursing notes.  Pertinent labs & imaging results that were available during my care of the patient were reviewed by me and considered in my medical decision making (see chart for details).      Patient Vitals for the past 24 hrs:  BP Temp Temp src Pulse Resp SpO2 Height Weight  10/16/17  1500 (!) 166/107 - - (!) 102 16 95 % - -  10/16/17 1400 (!) 149/106 - - (!) 104 16 94 % - -  10/16/17 1300 (!) 148/107 - - 96 16 96 % - -  10/16/17 1036 (!) 144/98 98.4 F (36.9 C) Oral (!) 104 16 99 % - -  10/16/17 1034 - - - - - - 6\' 1"  (1.854 m) 117.9 kg (260 lb)  10/16/17 1031 - - - - - 98 % - -    3:26 PM Reevaluation with update and discussion. After initial assessment and treatment, an updated evaluation reveals the patient is more comfortable now.  Findings discussed and questions answered. Daleen Bo     Final Clinical Impressions(s) / ED Diagnoses   Final diagnoses:  Left wrist pain  Acute gout of left wrist, unspecified cause    Evaluations consistent with acute gout attack.  He is disabled with right hemiplegia, previously.  Clinical exam today consistent with monoarthritis, left wrist.  Screening blood work indicates elevated serum uric acid, normal white blood cell and red blood cell counts, normal electrolytes.  Doubt serious bacterial infection, metabolic instability or impending vascular collapse.  Vital signs indicate mild hypertension, associated with a painful condition.  The patient is currently treated for hypotension at home.  He has run out of his gout medications, but will be reinitiated along with a burst of steroid treatment and narcotic  pain reliever.  Nursing Notes Reviewed/ Care Coordinated Applicable Imaging Reviewed Interpretation of Laboratory Data incorporated into ED treatment  The patient appears reasonably screened and/or stabilized for discharge and I doubt any other medical condition or other [facility] requiring further screening, evaluation, or treatment in the ED at this time prior to discharge.  Plan: Home Medications-continue usual medications ; Home Treatments-heat to affected area; return here if the recommended treatment, does not improve the symptoms; Recommended follow up-PCP 1 week for checkup.   ED Discharge Orders        Ordered    allopurinol (ZYLOPRIM) 100 MG tablet  Daily     10/16/17 1525    colchicine 0.6 MG tablet  Every 1 hour PRN     10/16/17 1525    predniSONE (DELTASONE) 20 MG tablet  2 times daily     10/16/17 1525    HYDROcodone-acetaminophen (NORCO) 5-325 MG tablet  Every 4 hours PRN     10/16/17 1525       Daleen Bo, MD 10/17/17 1005

## 2017-10-16 NOTE — ED Triage Notes (Signed)
Per EMS, pt c/o R foot, R knee, and L wrist pain since yesterday. States he believes this is a gout flare. Pt has R side weakness due to recent stroke.

## 2017-10-16 NOTE — ED Notes (Signed)
Bed: FV88 Expected date:  Expected time:  Means of arrival:  Comments: EMS/gout pain

## 2017-10-16 NOTE — ED Notes (Signed)
ED Provider at bedside. 

## 2017-11-07 DIAGNOSIS — R7303 Prediabetes: Secondary | ICD-10-CM | POA: Diagnosis not present

## 2017-11-07 DIAGNOSIS — I10 Essential (primary) hypertension: Secondary | ICD-10-CM | POA: Diagnosis not present

## 2017-11-07 DIAGNOSIS — Z5181 Encounter for therapeutic drug level monitoring: Secondary | ICD-10-CM | POA: Diagnosis not present

## 2017-11-07 DIAGNOSIS — Z01118 Encounter for examination of ears and hearing with other abnormal findings: Secondary | ICD-10-CM | POA: Diagnosis not present

## 2017-11-07 DIAGNOSIS — Z0000 Encounter for general adult medical examination without abnormal findings: Secondary | ICD-10-CM | POA: Diagnosis not present

## 2017-11-07 DIAGNOSIS — Z136 Encounter for screening for cardiovascular disorders: Secondary | ICD-10-CM | POA: Diagnosis not present

## 2017-11-07 DIAGNOSIS — H538 Other visual disturbances: Secondary | ICD-10-CM | POA: Diagnosis not present

## 2017-12-12 DIAGNOSIS — E785 Hyperlipidemia, unspecified: Secondary | ICD-10-CM | POA: Diagnosis not present

## 2017-12-12 DIAGNOSIS — Z0000 Encounter for general adult medical examination without abnormal findings: Secondary | ICD-10-CM | POA: Diagnosis not present

## 2017-12-12 DIAGNOSIS — R7303 Prediabetes: Secondary | ICD-10-CM | POA: Diagnosis not present

## 2018-02-19 ENCOUNTER — Other Ambulatory Visit: Payer: Self-pay

## 2018-02-19 ENCOUNTER — Encounter (HOSPITAL_COMMUNITY): Payer: Self-pay | Admitting: *Deleted

## 2018-02-19 ENCOUNTER — Emergency Department (HOSPITAL_COMMUNITY)
Admission: EM | Admit: 2018-02-19 | Discharge: 2018-02-20 | Disposition: A | Discharge status: Home / Self Care | Payer: Medicare Other | Attending: Emergency Medicine | Admitting: Emergency Medicine

## 2018-02-19 DIAGNOSIS — N189 Chronic kidney disease, unspecified: Secondary | ICD-10-CM | POA: Diagnosis not present

## 2018-02-19 DIAGNOSIS — M1009 Idiopathic gout, multiple sites: Secondary | ICD-10-CM | POA: Diagnosis not present

## 2018-02-19 DIAGNOSIS — Z79899 Other long term (current) drug therapy: Secondary | ICD-10-CM | POA: Diagnosis not present

## 2018-02-19 DIAGNOSIS — F1410 Cocaine abuse, uncomplicated: Secondary | ICD-10-CM | POA: Diagnosis not present

## 2018-02-19 DIAGNOSIS — M109 Gout, unspecified: Secondary | ICD-10-CM

## 2018-02-19 DIAGNOSIS — I129 Hypertensive chronic kidney disease with stage 1 through stage 4 chronic kidney disease, or unspecified chronic kidney disease: Secondary | ICD-10-CM | POA: Diagnosis not present

## 2018-02-19 DIAGNOSIS — Z7982 Long term (current) use of aspirin: Secondary | ICD-10-CM | POA: Diagnosis not present

## 2018-02-19 DIAGNOSIS — Z8673 Personal history of transient ischemic attack (TIA), and cerebral infarction without residual deficits: Secondary | ICD-10-CM | POA: Diagnosis not present

## 2018-02-19 DIAGNOSIS — M79672 Pain in left foot: Secondary | ICD-10-CM | POA: Diagnosis present

## 2018-02-19 NOTE — ED Triage Notes (Signed)
Pt reports eating hot sauce and having a gout flare up in R knee, L foot and L arm.

## 2018-02-20 ENCOUNTER — Other Ambulatory Visit: Payer: Self-pay

## 2018-02-20 DIAGNOSIS — M1009 Idiopathic gout, multiple sites: Secondary | ICD-10-CM | POA: Diagnosis not present

## 2018-02-20 MED ORDER — PREDNISONE 20 MG PO TABS: ORAL_TABLET | ORAL | 0 refills | Status: DC

## 2018-02-20 MED ORDER — HYDROCODONE-ACETAMINOPHEN 5-325 MG PO TABS
1.0000 | ORAL_TABLET | Freq: Once | ORAL | Status: AC
  Administered 2018-02-20: 1 via ORAL
  Filled 2018-02-20: qty 1

## 2018-02-20 MED ORDER — HYDROCODONE-ACETAMINOPHEN 5-325 MG PO TABS: 1.0000 | ORAL_TABLET | ORAL | 0 refills | Status: DC | PRN

## 2018-02-20 MED ORDER — PREDNISONE 20 MG PO TABS
60.0000 mg | ORAL_TABLET | Freq: Once | ORAL | Status: AC
  Administered 2018-02-20: 60 mg via ORAL
  Filled 2018-02-20: qty 3

## 2018-02-20 NOTE — ED Provider Notes (Signed)
[facility] EMERGENCY DEPARTMENT Provider Note   CSN: [id number] Arrival date & time: 02/19/18  2214     History   Chief Complaint Chief Complaint  Patient presents with  . Gout    HPI Randall Mccall is a 47 y.o. male.  The history is provided by the patient and medical records.     47 year old male with history of hypertension, gout, prior stroke, chronic kidney disease, cocaine abuse, presenting to the ED with gout flare.  Patient reports pain in his left foot, right knee, and left wrist secondary to gout.  States he ate fish with hot salt a few days ago and since then has had worsening pain in his joints feel "hot".  States pain feels consistent with prior gout flares.  States he does take daily colchicine.  He denies any fever or chills.  No new injuries or trauma.  Past Medical History:  Diagnosis Date  . Gout   . Hypertension   . Muscle weakness   . Stroke [facility])     Patient Active Problem List   Diagnosis Date Noted  . CKD (chronic kidney disease) 12/18/2016  . Benign essential HTN   . Idiopathic gout   . Diastolic dysfunction   . Cocaine abuse ([place])   . Right hemiparesis ([place])   . 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 Cerebrovascular accident (CVA) ([place])   . Acute ischemic stroke ([place]) 09/15/2016  . Stroke (cerebrum) ([place]) 09/15/2016  . Hypertension 09/15/2016  . Vitamin B12 deficiency 08/10/2015  . Weakness 07/02/2013  . Abnormal CPK 07/02/2013    Past Surgical History:  Procedure Laterality Date  . KNEE SURGERY          Home Medications    Prior to Admission medications   Medication Sig Start Date End Date Taking? Authorizing Provider  allopurinol (ZYLOPRIM) 100 MG tablet Take 1 tablet (100 mg total) by mouth daily. 10/16/17   Daleen Bo, MD  amLODipine (NORVASC) 10 MG tablet Take 1 tablet (10 mg total) by mouth daily. 06/15/16   Elnora Morrison, MD  aspirin 325  MG tablet Take 1 tablet (325 mg total) by mouth daily. 09/18/16   Nita Sells, MD  atorvastatin (LIPITOR) 80 MG tablet Take 1 tablet (80 mg total) by mouth daily at 6 PM. 09/17/16   Nita Sells, MD  Cholecalciferol (VITAMIN D3) 5000 units TABS Take 1 tablet by mouth daily. 09/25/17   [provider]  cloNIDine (CATAPRES) 0.1 MG tablet Take 1 tablet (0.1 mg total) by mouth 2 (two) times daily. 06/15/16   Elnora Morrison, MD  colchicine 0.6 MG tablet Take 1 tablet (0.6 mg total) by mouth every hour as needed. Up to 4 in one 24-hour period, if needed for pain 10/16/17   Daleen Bo, MD  gabapentin (NEURONTIN) 300 MG capsule Take 300 mg by mouth at bedtime. 10/09/17   [provider]  HYDROcodone-acetaminophen (NORCO) 5-325 MG tablet Take 1 tablet by mouth every 4 (four) hours as needed. 10/16/17   Daleen Bo, MD  oxyCODONE-acetaminophen (PERCOCET/ROXICET) 5-325 MG tablet Take 1 tablet by mouth every 8 (eight) hours as needed for severe pain. 09/27/16   Lauree Chandler, NP  predniSONE (DELTASONE) 20 MG tablet Take 1 tablet (20 mg total) by mouth 2 (two) times daily. 10/16/17   Daleen Bo, MD  senna-docusate (SENOKOT-S) 8.6-50 MG tablet Take 1 tablet by mouth at bedtime as needed for mild constipation. 09/17/16  Nita Sells, MD    Family History No family history on file.  Social History Social History   Tobacco Use  . Smoking status: Never Smoker  . Smokeless tobacco: Never Used  Substance Use Topics  . Alcohol use: Yes    Alcohol/week: 0.6 oz    Types: 1 Cans of beer per week  . Drug use: No     Allergies   Patient has no known allergies.   Review of Systems Review of Systems  Musculoskeletal: Positive for arthralgias and joint swelling.  All other systems reviewed and are negative.    Physical Exam Updated Vital Signs BP (!) 127/92 (BP Location: Left Arm)   Pulse 80   Temp 97.9 F (36.6 C) (Oral)   Resp 18   SpO2 96%    Physical Exam  Constitutional: He is oriented to person, place, and time. He appears well-developed and well-nourished.  HENT:  Head: Normocephalic and atraumatic.  Mouth/Throat: Oropharynx is clear and moist.  Eyes: Pupils are equal, round, and reactive to light. Conjunctivae and EOM are normal.  Neck: Normal range of motion.  Cardiovascular: Normal rate, regular rhythm and normal heart sounds.  Pulmonary/Chest: Effort normal and breath sounds normal.  Abdominal: Soft. Bowel sounds are normal.  Musculoskeletal: Normal range of motion.  Swelling noted of left foot extending into the ankle, there is some warmth to touch without overlying erythema, induration, or signs of cellulitis, right knee and left wrist are also somewhat warm to touch without visible swelling or deformity  Neurological: He is alert and oriented to person, place, and time.  Skin: Skin is warm and dry.  Psychiatric: He has a normal mood and affect.  Nursing note and vitals reviewed.    ED Treatments / Results  Labs (all labs ordered are listed, but only abnormal results are displayed) Labs Reviewed - No data to display  EKG None  Radiology No results found.  Procedures Procedures (including critical care time)  Medications Ordered in ED Medications  HYDROcodone-acetaminophen (NORCO/VICODIN) 5-325 MG per tablet 1 tablet (1 tablet Oral Given 02/20/18 0233)  predniSONE (DELTASONE) tablet 60 mg (60 mg Oral Given 02/20/18 0234)     Initial Impression / Assessment and Plan / ED Course  I have reviewed the triage vital signs and the nursing notes.  Pertinent labs & imaging results that were available during my care of the patient were reviewed by me and considered in my medical decision making (see chart for details).  47 year old male here with acute gout flare.  Likely exacerbated by eating fish a few days ago.  Reports history of same.  He is afebrile and nontoxic.  No signs or symptoms on exam that are  concerning for septic joint.  Will treat with prednisone and short course pain medication.  He understands to try and stick with low purine diet to help prevent gout flares.  He will follow-up closely with his PCP.  Discussed plan with patient, he acknowledged understanding and agreed with plan of care.  Return precautions given for new or worsening symptoms.  Final Clinical Impressions(s) / ED Diagnoses   Final diagnoses:  Acute gout of multiple sites, unspecified cause    ED Discharge Orders        Ordered    predniSONE (DELTASONE) 20 MG tablet     02/20/18 0251    HYDROcodone-acetaminophen (NORCO/VICODIN) 5-325 MG tablet  Every 4 hours PRN     02/20/18 0251       Larene Pickett,  PA-C 02/20/18 0408    Orpah Greek, MD 02/20/18 [phone]

## 2018-02-20 NOTE — Discharge Instructions (Signed)
Take the prescribed medication as directed. Avoid red meat, fish, alcohol, etc. Follow-up with your primary care doctor. Return to the ED for new or worsening symptoms.

## 2018-02-20 NOTE — ED Notes (Signed)
ED Provider at bedside. 

## 2018-02-20 NOTE — ED Notes (Signed)
Discharge instructions/prescriptions reviewed with patient. Pt verbalized understanding, signature pad unavailable. Pt denied any further requests

## 2018-02-24 ENCOUNTER — Other Ambulatory Visit: Payer: Self-pay | Admitting: *Deleted

## 2018-02-24 NOTE — Patient Outreach (Signed)
[facility]) Care Management  02/24/2018  Randall Mccall [birth year] [id number]   RN Health Coach  Attempted  #1 screening  outreach call to patient.  Patient was unavailable. No voice mail pickup. Plan: RN outreach again within 3-5 business days [facility] [phone]

## 2018-02-25 DIAGNOSIS — Z8619 Personal history of other infectious and parasitic diseases: Secondary | ICD-10-CM | POA: Diagnosis not present

## 2018-02-25 DIAGNOSIS — I10 Essential (primary) hypertension: Secondary | ICD-10-CM | POA: Diagnosis not present

## 2018-02-25 DIAGNOSIS — R7303 Prediabetes: Secondary | ICD-10-CM | POA: Diagnosis not present

## 2018-02-25 DIAGNOSIS — M1009 Idiopathic gout, multiple sites: Secondary | ICD-10-CM | POA: Diagnosis not present

## 2018-02-25 DIAGNOSIS — E785 Hyperlipidemia, unspecified: Secondary | ICD-10-CM | POA: Diagnosis not present

## 2018-02-25 DIAGNOSIS — Z8673 Personal history of transient ischemic attack (TIA), and cerebral infarction without residual deficits: Secondary | ICD-10-CM | POA: Diagnosis not present

## 2018-03-03 ENCOUNTER — Other Ambulatory Visit: Payer: Self-pay | Admitting: *Deleted

## 2018-03-03 NOTE — Patient Outreach (Signed)
[facility]) Care Management  03/03/2018  Randall Mccall Oct 31, 1970 [id number]   RN Health Coach telephone screening call to patient.  [employer] compliance verified. Hx of HTN, CVA with rt hemiparesis, Gout, CKD. Per patient his blood pressure is high. He went to see a new doctor on 07/02 Dr Emilee Hero Bonsu at [phone]. Per patient he had a stroke and has Rt hemiparesis. Patient is wanting physical therapy on rt arm and leg. Patient does not check his blood pressure. Only gets his blood pressure at Dr office. Per patient if he doesn't get the physical therapy/ OT his driver license will expire and he won't be able to drive. Patient stated he has decreased the salt in his diet.Patient would like an Emergency planning/management officer sent  Plan: Referral for complex Case management RN sent a welcome packet RN sent an Emergency planning/management officer RN sent a blood pressure cuff RN sent A matter of choice blood pressure control  [place] Management [phone]

## 2018-03-05 ENCOUNTER — Other Ambulatory Visit: Payer: Self-pay | Admitting: *Deleted

## 2018-03-05 NOTE — Patient Outreach (Signed)
[place]) Care Management  03/05/2018  Randall Mccall [birth year] [id number]    Telephone Assessment  RN spoke with pt today and introduced the [facility] program and services (pt receptive). Further discussed the referral for [facility] services related to his recent medical issues with a CVA. Pt states he needs rehabilitation and RN has confirmed that this was a matter for his provider to address with the requested order for PT services. Pt states her provider is working on this and is aware. RN further inquired on his education related to signs/symptoms and what to do if acute issues arise. Also inquired on if he has received the BP cuff sent via [facility] and if he needed assistance setting this device up. Pt states he "probably would". RN offered education at this time however pt prefers RN call back next week after he has received the device for possible arrangements for a home visit. RN will follow up next week however encouraged pt to monitor his limitation and ability with mobility to prevent acute events from occur. Pt did not wish to further discuss this topic and again would like to await his BP device. RN encouraged pt to have 4 AA batteries for the device once received for immediate use of the BP device for monitoring his BP.  RN will follow up next week and attempt to schedule a home visit and generate a plan of care for pt to participate in managing his HTN. No other inquires or request at this time.  Raina Mina, RN Care Management Coordinator [place] Office [phone]

## 2018-03-13 DIAGNOSIS — M1009 Idiopathic gout, multiple sites: Secondary | ICD-10-CM | POA: Diagnosis not present

## 2018-03-13 DIAGNOSIS — I10 Essential (primary) hypertension: Secondary | ICD-10-CM | POA: Diagnosis not present

## 2018-03-13 DIAGNOSIS — R7303 Prediabetes: Secondary | ICD-10-CM | POA: Diagnosis not present

## 2018-03-14 ENCOUNTER — Other Ambulatory Visit: Payer: Self-pay | Admitting: *Deleted

## 2018-03-14 NOTE — Patient Outreach (Signed)
[place] [facility]) Care Management  03/14/2018  ARAD BURSTON 1971-08-03 [id number]    RN returned a call to pt and reintroduce this RN case manager and again the purpose for today's call. Pt remembers and states he has received the BP devices sent via [facility] last week. Pt states he will now need to get some batteries and will start taking his BP. Pt remains receptive to working with this RN case Freight forwarder concerning education on his medical condition HTN. RN offered to further assist with a home visit and pt has accepted this offer. Scheduled a home visit for next week accordingly to pt's scheduled. Strongly encouraged pt to obtain batteries for his device for proper education on who to use the BP device for daily BP checks when needed. Offered to further assist with a plan of care and to obtain additional information however pt prefers to discuss this on the home visit. Will general a care plan at that time and further engage with interventions that pt needs to agree with in order to met such goals. Pt verbalized an understanding of the topics that will be discussed on the home visit with no objections noted at this time.   Raina Mina, RN Care Management Coordinator [place] Office [phone]

## 2018-03-20 ENCOUNTER — Other Ambulatory Visit: Payer: Self-pay | Admitting: *Deleted

## 2018-03-20 ENCOUNTER — Encounter: Payer: Self-pay | Admitting: *Deleted

## 2018-03-20 NOTE — Patient Outreach (Addendum)
[place] [facility]) Care Management   03/20/2018  Randall Mccall 1971-05-18 [id number]  Randall Mccall is an 47 y.o. male  Subjective:  HTN: Pt reports limited knowledge on the subject of hypertension (risk and symptoms) however receptive to learning more about managing this condition. Pt denies any symptoms and indicates he is not aware of the acronym F.A.S.T. Pt has admitted he does not know much about strokes. Denies any headaches or issues today with his GOUT (no flare-ups).  Pt able to verify all his medications with review today and has enough refills. Pt also verifies he is not driving but awaiting therapy to start on an out-patient services at [facility] has informed by his provider who will be sending the request. Pt indicates he is awaiting a call back on when to start.  Pt's mother present and reports pt's "drinks to much beer" and how does this affects his medical condition?   Objective:   Review of Systems  Constitutional: Negative.   HENT: Negative.   Eyes: Negative.   Respiratory: Negative.   Cardiovascular: Negative.   Gastrointestinal: Negative.   Genitourinary: Negative.   Musculoskeletal: Negative.   Skin: Negative.   Neurological: Negative.   Endo/Heme/Allergies: Negative.   Psychiatric/Behavioral: Negative.     Physical Exam  Constitutional: He is oriented to person, place, and time. He appears well-developed and well-nourished.  HENT:  Right Ear: External ear normal.  Left Ear: External ear normal.  Eyes: EOM are normal.  Neck: Normal range of motion.  Cardiovascular: Normal heart sounds.  Respiratory: Effort normal and breath sounds normal.  GI: Soft. Bowel sounds are normal.  Musculoskeletal:  Recent CVA with residual to the right upper arm/hand with some contractions.   Neurological: He is oriented to person, place, and time.  Skin: Skin is warm and dry.  Psychiatric: He has a normal mood and affect. His behavior is normal. Judgment and thought  content normal.    Encounter Medications:   Outpatient Encounter Medications as of 03/20/2018  Medication Sig  . allopurinol (ZYLOPRIM) 100 MG tablet Take 1 tablet (100 mg total) by mouth daily.  Marland Kitchen amLODipine (NORVASC) 10 MG tablet Take 1 tablet (10 mg total) by mouth daily.  Marland Kitchen aspirin 325 MG tablet Take 1 tablet (325 mg total) by mouth daily.  Marland Kitchen atorvastatin (LIPITOR) 80 MG tablet Take 1 tablet (80 mg total) by mouth daily at 6 PM.  . Cholecalciferol (VITAMIN D3) 5000 units TABS Take 1 tablet by mouth daily.  . cloNIDine (CATAPRES) 0.1 MG tablet Take 1 tablet (0.1 mg total) by mouth 2 (two) times daily.  . colchicine 0.6 MG tablet Take 1 tablet (0.6 mg total) by mouth every hour as needed. Up to 4 in one 24-hour period, if needed for pain  . gabapentin (NEURONTIN) 300 MG capsule Take 300 mg by mouth at bedtime.  Marland Kitchen HYDROcodone-acetaminophen (NORCO/VICODIN) 5-325 MG tablet Take 1 tablet by mouth every 4 (four) hours as needed.  Marland Kitchen oxyCODONE-acetaminophen (PERCOCET/ROXICET) 5-325 MG tablet Take 1 tablet by mouth every 8 (eight) hours as needed for severe pain.  . predniSONE (DELTASONE) 20 MG tablet Take 40 mg by mouth daily for 3 days, then 20mg  by mouth daily for 3 days, then 10mg  daily for 3 days  . senna-docusate (SENOKOT-S) 8.6-50 MG tablet Take 1 tablet by mouth at bedtime as needed for mild constipation.   No facility-administered encounter medications on file as of 03/20/2018.     Functional Status:   In your present  state of health, do you have any difficulty performing the following activities: 03/03/2018  Hearing? N  Vision? N  Difficulty concentrating or making decisions? N  Walking or climbing stairs? Y  Dressing or bathing? Y  Doing errands, shopping? Y  Preparing Food and eating ? N  Using the Toilet? N  In the past six months, have you accidently leaked urine? N  Do you have problems with loss of bowel control? N  Managing your Medications? N  Managing your Finances? N   Housekeeping or managing your Housekeeping? Y  Comment staying with mother  Some recent data might be hidden    Fall/Depression Screening:    Fall Risk  03/20/2018  Falls in the past year? No   PHQ 2/9 Scores 03/20/2018 03/03/2018  PHQ - 2 Score 0 1   THN CM Care Plan Problem One     Most Recent Value  Care Plan Problem One  Knowledge deficit related to HTN  Role Documenting the Problem One  Care Management Coordinator  Care Plan for Problem One  Active  THN Long Term Goal   Pt will be more knowledgable on HTN and how to monitor this condition for prevention measures over the next 90 days.  THN Long Term Goal Start Date  03/20/18  Interventions for Problem One Long Term Goal  Will educate pt on HTN and demonstrate use of the home BP device. Will also discuss possible signs/symptoms and what to do related to the [facility].S.T acronym.   THN CM Short Term Goal #1   t will take his BP twice weekly over the next 30 days  THN CM Short Term Goal #1 Start Date  03/20/18  Interventions for Short Term Goal #1  Will stress the importance of monitoring his BP and the risk involved. Will alert pt on the acronym for F.A.S.T and what to do if acute symptoms are presented.  THN CM Short Term Goal #2   Pt will consume a healthier diet with low sodium food items to lower his risk over the next 30 days.  THN CM Short Term Goal #2 Start Date  03/20/18  Interventions for Short Term Goal #2  Will discuss health eating habits and stress the improtance of a low sodium diet to lower his risk for potential acute symptoms of HTN./CVA     BP (!) 122/92 (BP Location: Left Arm, Patient Position: Sitting, Cuff Size: Normal) Comment: BP home device 135/95 asymptomatic  Pulse 66   Resp (!) 22   Wt 250 lb (113.4 kg)   SpO2 98%   BMI 32.98 kg/m    Assessment:   Consent obtain related to [facility] services for community case management Hypertension related to recent history of CVA Risk of drinking alcohol with related illness  and medications  Plan:  Will obtain a signed consent form for [facility] services.  Will educate pt on HTN and the risk involved if this condition is not monitored. Will provide printed material and review all information. Will obtain a manual and automatic BP and educate pt on the importance of daily monitoring by taking his BP several times weekly and documenting all readings in the provided [facility] calendar for his providers to view. Stress the importance of prevention measures and educated pt on F.A.S.T and what to do if acute symptoms should occur related to a stroke. Verified pt remains without symptoms today with no distressful signs related to his elevated BP.  Will obtain a manual 122/92 and automatic from  pt's home device at 135/95 and verify pt remains without symptoms. Will review all medications and verify pt has taken all his medications as prescribed at this time. Will alert pt that his BP is slightly elevated and to continue to monitoring with home device and report any symptoms or if BP continue to increase throughout the day with ongoing BP monitoring (pt with understanding).  Will develop a plan of care and discuss goal with interventions to assist pt with meeting in managing this condition.  Will notify pt's provider on pt's disposition with [facility] services.  Will educate pt on the risk involved when drinking alcohol and taking medications may cause side affects. If more information is needed encouraged pt's mother and pt to inquired with his local pharmacy. Will stress the importance of not drinking when taking his medications. No request or other inquires at this time.  Raina Mina, RN Care Management Coordinator [place] Office [phone]

## 2018-04-15 DIAGNOSIS — M1009 Idiopathic gout, multiple sites: Secondary | ICD-10-CM | POA: Diagnosis not present

## 2018-04-15 DIAGNOSIS — E559 Vitamin D deficiency, unspecified: Secondary | ICD-10-CM | POA: Diagnosis not present

## 2018-04-15 DIAGNOSIS — R7303 Prediabetes: Secondary | ICD-10-CM | POA: Diagnosis not present

## 2018-04-22 ENCOUNTER — Other Ambulatory Visit: Payer: Self-pay | Admitting: *Deleted

## 2018-04-22 NOTE — Patient Outreach (Signed)
[place] [facility]) Care Management  04/22/2018  Randall Mccall 07/15/1971 [id number]   RN received a call from pt via voice mail requesting a call back. RN spoke with pt while in route today's home visit as pt requested to reschedule this Friday at 3 PM. RN rescheduled today's appointment due to pt's request and will completed a home visit later in this week. Will update the plan of care at that time and interventions accordingly.  Raina Mina, RN Care Management Coordinator [place] Office [phone]

## 2018-04-25 ENCOUNTER — Other Ambulatory Visit: Payer: Self-pay | Admitting: *Deleted

## 2018-04-25 NOTE — Patient Outreach (Addendum)
[place] [facility]) Care Management   04/25/2018  Randall Mccall 02-Nov-1970 [id number]  Randall Mccall is an 47 y.o. male  Subjective:  HTN: Pt continue to monitoring his BP with the provided DME BP device with good readings. Pt states he is making a mental note to report to his providers and has been taking his BP twice daily.  Pt aware of the importance of keeping his BP regulated.  Pt aware to limit in the heat zones and lower stress levels to prevent acute events or episodes. EDEMA: Pt reports some swelling to his LE however better in the AM but by the end of the day only his RLE swells. Pt aware to elevate his LE to reduce ongoing swelling. States his provider has also informed him of what to do if this occurs.  Other issues pt mentioned related to awaiting referral for eye exam so he can get his license to drive. Pt has also indicated he will contact his pharmacy to renew his Gabapentin medication. No other request or inquires at this time.   Objective:   Review of Systems  All other systems reviewed and are negative.   Physical Exam  Constitutional: He is oriented to person, place, and time. He appears well-developed and well-nourished.  HENT:  Right Ear: External ear normal.  Left Ear: External ear normal.  Eyes: EOM are normal.  Neck: Normal range of motion.  Cardiovascular: Normal heart sounds.  Respiratory: Effort normal and breath sounds normal.  GI: Soft. Bowel sounds are normal.  Musculoskeletal:  Limited mobility with right side residual from recent stroke.  Neurological: He is alert and oriented to person, place, and time.  Skin: Skin is warm and dry.  Psychiatric: He has a normal mood and affect. His behavior is normal. Judgment and thought content normal.    Encounter Medications:   Outpatient Encounter Medications as of 04/25/2018  Medication Sig  . allopurinol (ZYLOPRIM) 100 MG tablet Take 1 tablet (100 mg total) by mouth daily.  Marland Kitchen amLODipine  (NORVASC) 10 MG tablet Take 1 tablet (10 mg total) by mouth daily.  Marland Kitchen aspirin 325 MG tablet Take 1 tablet (325 mg total) by mouth daily.  Marland Kitchen atorvastatin (LIPITOR) 80 MG tablet Take 1 tablet (80 mg total) by mouth daily at 6 PM.  . Cholecalciferol (VITAMIN D3) 5000 units TABS Take 1 tablet by mouth daily.  . cloNIDine (CATAPRES) 0.1 MG tablet Take 1 tablet (0.1 mg total) by mouth 2 (two) times daily.  . colchicine 0.6 MG tablet Take 1 tablet (0.6 mg total) by mouth every hour as needed. Up to 4 in one 24-hour period, if needed for pain  . gabapentin (NEURONTIN) 300 MG capsule Take 300 mg by mouth at bedtime.  Marland Kitchen HYDROcodone-acetaminophen (NORCO/VICODIN) 5-325 MG tablet Take 1 tablet by mouth every 4 (four) hours as needed.  Marland Kitchen oxyCODONE-acetaminophen (PERCOCET/ROXICET) 5-325 MG tablet Take 1 tablet by mouth every 8 (eight) hours as needed for severe pain.  . predniSONE (DELTASONE) 20 MG tablet Take 40 mg by mouth daily for 3 days, then 15m by mouth daily for 3 days, then 145mdaily for 3 days  . senna-docusate (SENOKOT-S) 8.6-50 MG tablet Take 1 tablet by mouth at bedtime as needed for mild constipation.   No facility-administered encounter medications on file as of 04/25/2018.     Functional Status:   In your present state of health, do you have any difficulty performing the following activities: 03/03/2018  Hearing? N  Vision?  N  Difficulty concentrating or making decisions? N  Walking or climbing stairs? Y  Dressing or bathing? Y  Doing errands, shopping? Y  Preparing Food and eating ? N  Using the Toilet? N  In the past six months, have you accidently leaked urine? N  Do you have problems with loss of bowel control? N  Managing your Medications? N  Managing your Finances? N  Housekeeping or managing your Housekeeping? Y  Comment staying with mother  Some recent data might be hidden    Fall/Depression Screening:    Fall Risk  03/20/2018  Falls in the past year? No   PHQ 2/9 Scores  03/20/2018 03/03/2018  PHQ - 2 Score 0 1   THN CM Care Plan Problem One     Most Recent Value  Care Plan Problem One  Knowledge deficit related to HTN  Role Documenting the Problem One  Care Management Coordinator  Care Plan for Problem One  Active  THN Long Term Goal   Pt will be more knowledgable on HTN and how to monitor this condition for prevention measures over the next 90 days.  THN Long Term Goal Start Date  03/20/18  Interventions for Problem One Long Term Goal  Will reiterated on the s/s and what to do if acute symptoms are experienced. Will  strongly encouraged pt to review the printed material to lower the risk of strokes.   THN CM Short Term Goal #1   Pt will take his BP twice weekly over the next 30 days  THN CM Short Term Goal #1 Start Date  03/20/18  [facility] CM Short Term Goal #1 Met Date  04/25/18  Interventions for Short Term Goal #1  Pt reports taking his BP twice daily at this time  [facility] CM Short Term Goal #2   Pt will consume a healthier diet with low sodium food items to lower his risk over the next 30 days.  THN CM Short Term Goal #2 Start Date  03/20/18  Interventions for Short Term Goal #2  Will reiterate on low salt dieatry habits to reduce his risk of HTN. Will extend to allow adherence.     BP 122/80 (BP Location: Left Arm, Patient Position: Sitting, Cuff Size: Normal)   Pulse 81   Resp 20   Ht 1.854 m ('6\' 1"' )   Wt 260 lb (117.9 kg)   SpO2 96%   BMI 34.30 kg/m   Assessment:   Ongoing case management related to HTN Swelling related to RLE Medication related to refill  Plan:  Will reiterated on the education provided on HTN and what to do if acute symptoms should occur. Will continue to verify pt understanding of his risk with his habits and how to lower the risk with preventive measures. Will review the plan of care and adjust interventions accordingly to all pt to continue to manage his care. Will continue to encourage pt to continue taking his BP daily and what  to to with acute readings and any symptoms related.  Will strongly encouraged pt to elevate his LE to reduce ongoing swelling to the affected residual side.  Will verify no other needs at this time and encouraged pt to continue managing his health and HTN.  Will strongly encouraged pt to refill his medications and take all medication accordingly to the prescribed dosage. Will scheduled next home visit accordingly for ongoing case management services.   Raina Mina, RN Care Management Coordinator [place] Office [phone]

## 2018-05-05 DIAGNOSIS — I69351 Hemiplegia and hemiparesis following cerebral infarction affecting right dominant side: Secondary | ICD-10-CM | POA: Diagnosis not present

## 2018-05-05 DIAGNOSIS — E785 Hyperlipidemia, unspecified: Secondary | ICD-10-CM | POA: Diagnosis not present

## 2018-05-05 DIAGNOSIS — I672 Cerebral atherosclerosis: Secondary | ICD-10-CM | POA: Diagnosis not present

## 2018-05-05 DIAGNOSIS — I10 Essential (primary) hypertension: Secondary | ICD-10-CM | POA: Diagnosis not present

## 2018-05-20 ENCOUNTER — Encounter (HOSPITAL_COMMUNITY): Payer: Self-pay

## 2018-05-20 ENCOUNTER — Other Ambulatory Visit: Payer: Self-pay

## 2018-05-20 ENCOUNTER — Emergency Department (HOSPITAL_COMMUNITY)
Admission: EM | Admit: 2018-05-20 | Discharge: 2018-05-20 | Disposition: A | Discharge status: Home / Self Care | Payer: Medicare Other | Attending: Emergency Medicine | Admitting: Emergency Medicine

## 2018-05-20 DIAGNOSIS — M109 Gout, unspecified: Secondary | ICD-10-CM | POA: Insufficient documentation

## 2018-05-20 DIAGNOSIS — Z7982 Long term (current) use of aspirin: Secondary | ICD-10-CM | POA: Insufficient documentation

## 2018-05-20 DIAGNOSIS — N189 Chronic kidney disease, unspecified: Secondary | ICD-10-CM | POA: Diagnosis not present

## 2018-05-20 DIAGNOSIS — I130 Hypertensive heart and chronic kidney disease with heart failure and stage 1 through stage 4 chronic kidney disease, or unspecified chronic kidney disease: Secondary | ICD-10-CM | POA: Diagnosis not present

## 2018-05-20 DIAGNOSIS — Z79899 Other long term (current) drug therapy: Secondary | ICD-10-CM | POA: Insufficient documentation

## 2018-05-20 DIAGNOSIS — R2232 Localized swelling, mass and lump, left upper limb: Secondary | ICD-10-CM | POA: Diagnosis present

## 2018-05-20 DIAGNOSIS — I5030 Unspecified diastolic (congestive) heart failure: Secondary | ICD-10-CM | POA: Insufficient documentation

## 2018-05-20 MED ORDER — OXYCODONE-ACETAMINOPHEN 5-325 MG PO TABS
1.0000 | ORAL_TABLET | Freq: Once | ORAL | Status: AC
  Administered 2018-05-20: 1 via ORAL
  Filled 2018-05-20: qty 1

## 2018-05-20 MED ORDER — OXYCODONE-ACETAMINOPHEN 5-325 MG PO TABS: 1.0000 | ORAL_TABLET | Freq: Three times a day (TID) | ORAL | 0 refills | Status: DC | PRN

## 2018-05-20 MED ORDER — PREDNISONE 10 MG (21) PO TBPK: ORAL_TABLET | Freq: Every day | ORAL | 0 refills | Status: DC

## 2018-05-20 MED ORDER — PREDNISONE 20 MG PO TABS
60.0000 mg | ORAL_TABLET | Freq: Once | ORAL | Status: AC
  Administered 2018-05-20: 60 mg via ORAL
  Filled 2018-05-20: qty 3

## 2018-05-20 NOTE — Discharge Instructions (Signed)
Take the prednisone starting tomorrow. Return to ED for worsening symptoms, numbness in arms or legs, injuries or falls, red hot or tender joint, fevers.

## 2018-05-20 NOTE — ED Triage Notes (Signed)
Patient c/o bilateral hand swelling and right knee swelling. Patient reports a history of gout. Patient denies any injury to joints

## 2018-05-20 NOTE — ED Provider Notes (Signed)
[facility] DEPT Provider Note   CSN: [id number] Arrival date & time: 05/20/18  1206     History   Chief Complaint Chief Complaint  Patient presents with  . hand swelling  . Joint Swelling    HPI Randall Mccall is a 47 y.o. male with a past medical history of gout, prior stroke resulting in mild right-sided weakness presents for 3-day history of swelling and pain to his right ankle and left hand.  States that it feels similar to his gout flareups.  States the pain is sharp, severe and rates it at 10/10.  Has not taken any medications to help with his symptoms.  Denies any injuries or falls, trauma to the area.  He does report having a surgery on his right knee to "get the poison of the gout out."  This was several years ago.  Denies any fever, history of septic joint, numbness in arms or legs.  HPI  Past Medical History:  Diagnosis Date  . Gout   . Hypertension   . Muscle weakness   . Stroke [facility])     Patient Active Problem List   Diagnosis Date Noted  . CKD (chronic kidney disease) 12/18/2016  . Benign essential HTN   . Idiopathic gout   . Diastolic dysfunction   . Cocaine abuse ([place])   . Right hemiparesis ([place])   . Monoplegia of upper extremity following cerebral infarction affecting right dominant side ([place])   . Cerebrovascular accident (CVA) ([place])   . Acute ischemic stroke ([place]) 09/15/2016  . Stroke (cerebrum) ([place]) 09/15/2016  . Hypertension 09/15/2016  . Vitamin B12 deficiency 08/10/2015  . Weakness 07/02/2013  . Abnormal CPK 07/02/2013    Past Surgical History:  Procedure Laterality Date  . KNEE SURGERY          Home Medications    Prior to Admission medications   Medication Sig Start Date End Date Taking? Authorizing Provider  allopurinol (ZYLOPRIM) 100 MG tablet Take 1 tablet (100 mg total) by mouth daily. 10/16/17   Daleen Bo, MD  amLODipine (NORVASC) 10 MG tablet Take 1 tablet (10 mg total) by mouth daily.  06/15/16   Elnora Morrison, MD  aspirin 325 MG tablet Take 1 tablet (325 mg total) by mouth daily. 09/18/16   Nita Sells, MD  atorvastatin (LIPITOR) 80 MG tablet Take 1 tablet (80 mg total) by mouth daily at 6 PM. 09/17/16   Nita Sells, MD  Cholecalciferol (VITAMIN D3) 5000 units TABS Take 1 tablet by mouth daily. 09/25/17   [provider]  cloNIDine (CATAPRES) 0.1 MG tablet Take 1 tablet (0.1 mg total) by mouth 2 (two) times daily. 06/15/16   Elnora Morrison, MD  colchicine 0.6 MG tablet Take 1 tablet (0.6 mg total) by mouth every hour as needed. Up to 4 in one 24-hour period, if needed for pain 10/16/17   Daleen Bo, MD  gabapentin (NEURONTIN) 300 MG capsule Take 300 mg by mouth at bedtime. 10/09/17   [provider]  HYDROcodone-acetaminophen (NORCO/VICODIN) 5-325 MG tablet Take 1 tablet by mouth every 4 (four) hours as needed. 02/20/18   Larene Pickett, PA-C  oxyCODONE-acetaminophen (PERCOCET/ROXICET) 5-325 MG tablet Take 1 tablet by mouth every 8 (eight) hours as needed for severe pain. 05/20/18   Khatri, Hina, PA-C  predniSONE (STERAPRED UNI-PAK 21 TAB) 10 MG (21) TBPK tablet Take by mouth daily. Take 6 tabs by mouth daily  for 1 days, then 5 tabs for 2 days, then 4  tabs for 2 days, then 3 tabs for 2 days, 2 tabs for 2 days, then 1 tab by mouth daily for 2 days 05/20/18   Shelly Coss, Hina, PA-C  senna-docusate (SENOKOT-S) 8.6-50 MG tablet Take 1 tablet by mouth at bedtime as needed for mild constipation. 09/17/16   Nita Sells, MD    Family History Family History  Problem Relation Age of Onset  . Hypertension Mother   . Cirrhosis Father     Social History Social History   Tobacco Use  . Smoking status: Never Smoker  . Smokeless tobacco: Never Used  Substance Use Topics  . Alcohol use: Yes    Alcohol/week: 1.0 standard drinks    Types: 1 Cans of beer per week  . Drug use: No     Allergies   Patient has no known allergies.   Review of  Systems Review of Systems  Constitutional: Negative for appetite change, chills and fever.  HENT: Negative for ear pain, rhinorrhea, sneezing and sore throat.   Eyes: Negative for photophobia and visual disturbance.  Respiratory: Negative for cough, chest tightness, shortness of breath and wheezing.   Cardiovascular: Negative for chest pain and palpitations.  Gastrointestinal: Negative for abdominal pain, blood in stool, constipation, diarrhea, nausea and vomiting.  Genitourinary: Negative for dysuria, hematuria and urgency.  Musculoskeletal: Positive for arthralgias and joint swelling. Negative for myalgias.  Skin: Negative for rash.  Neurological: Negative for dizziness, weakness and light-headedness.     Physical Exam Updated Vital Signs BP (!) 137/107 (BP Location: Right Arm)   Pulse (!) 101   Temp 98.2 F (36.8 C) (Oral)   Resp 18   Ht 6\' 1"  (1.854 m)   Wt 117.9 kg   SpO2 99%   BMI 34.30 kg/m   Physical Exam  Constitutional: He appears well-developed and well-nourished. No distress.  HENT:  Head: Normocephalic and atraumatic.  Nose: Nose normal.  Eyes: Conjunctivae and EOM are normal. Left eye exhibits no discharge. No scleral icterus.  Neck: Normal range of motion. Neck supple.  Cardiovascular: Normal rate, regular rhythm, normal heart sounds and intact distal pulses. Exam reveals no gallop and no friction rub.  No murmur heard. Pulmonary/Chest: Effort normal and breath sounds normal. No respiratory distress.  Abdominal: Soft. Bowel sounds are normal. He exhibits no distension. There is no tenderness. There is no guarding.  Musculoskeletal: Normal range of motion. He exhibits edema and tenderness.  Mild edema and tenderness palpation of the right knee, left fifth MCP joint.  No overlying warmth.  Area is neurovascularly intact.  Limited range of motion secondary to pain although able to perform of the knee and digits.  Neurological: He is alert. He exhibits normal  muscle tone. Coordination normal.  Skin: Skin is warm and dry. No rash noted.  Psychiatric: He has a normal mood and affect.  Nursing note and vitals reviewed.    ED Treatments / Results  Labs (all labs ordered are listed, but only abnormal results are displayed) Labs Reviewed - No data to display  EKG None  Radiology No results found.  Procedures Procedures (including critical care time)  Medications Ordered in ED Medications  oxyCODONE-acetaminophen (PERCOCET/ROXICET) 5-325 MG per tablet 1 tablet (has no administration in time range)  predniSONE (DELTASONE) tablet 60 mg (has no administration in time range)     Initial Impression / Assessment and Plan / ED Course  I have reviewed the triage vital signs and the nursing notes.  Pertinent labs & imaging results that were  available during my care of the patient were reviewed by me and considered in my medical decision making (see chart for details).     47 year old male with past medical history of gout presents for gout flareup.  This could be related to recent food intake.  States that this feels similar to his prior flareups.  No concerning findings for infectious or vascular cause.  Patient educated on diet restrictions and follow-up with PCP.  Will treat with prednisone and short course of pain medication.  Advised to return to ED for any severe worsening symptoms. [place] PMP reviewed with no discrepancies.  Portions of this note were generated with [employer]. Dictation errors may occur despite best attempts at proofreading.   Final Clinical Impressions(s) / ED Diagnoses   Final diagnoses:  Acute gout of multiple sites, unspecified cause    ED Discharge Orders         Ordered    oxyCODONE-acetaminophen (PERCOCET/ROXICET) 5-325 MG tablet  Every 8 hours PRN     05/20/18 1422    predniSONE (STERAPRED UNI-PAK 21 TAB) 10 MG (21) TBPK tablet  Daily     05/20/18 1422           Delia Heady,  PA-C 05/20/18 1425    Sherwood Gambler, MD 05/20/18 1502

## 2018-05-21 ENCOUNTER — Other Ambulatory Visit: Payer: Self-pay | Admitting: *Deleted

## 2018-05-21 DIAGNOSIS — I69351 Hemiplegia and hemiparesis following cerebral infarction affecting right dominant side: Secondary | ICD-10-CM | POA: Diagnosis not present

## 2018-05-21 DIAGNOSIS — G9389 Other specified disorders of brain: Secondary | ICD-10-CM | POA: Diagnosis not present

## 2018-05-21 DIAGNOSIS — R9082 White matter disease, unspecified: Secondary | ICD-10-CM | POA: Diagnosis not present

## 2018-05-21 DIAGNOSIS — I771 Stricture of artery: Secondary | ICD-10-CM | POA: Diagnosis not present

## 2018-05-21 NOTE — Patient Outreach (Signed)
[facility]) Care Management  05/21/2018  Randall Mccall 01/24/1971 [id number]   [facility] ED visit on 9/24  RN spoke with pt today and discussed his recent visit to the ED. Pt aware that this flare up was related to his dietary habits and plans to change some of the foods and habits that lead up to his recent ED visit. Pt verbalized an understanding of his medications with the only change to take his GOUT medications only when needed. No new medication were added. No other request or inquires related to this recent ED visit as pt feels he is able to manage this medical condition through changing his dietary habits. RN inquired and offered to discuss food to avoid with his GOUT however pt indicates he is aware and decline this conversation.   RN inquired on his BP as pt reports 134/71 this morning with no encountered problems. RN informed pt of the upcoming home visit on this Friday. Will update the plan of care and inquired further on pt's management of care at that time.    Raina Mina, RN Care Management Coordinator [place] Office [phone]

## 2018-05-23 ENCOUNTER — Other Ambulatory Visit: Payer: Self-pay | Admitting: *Deleted

## 2018-05-23 NOTE — Patient Outreach (Signed)
[place] [facility]) Care Management   05/23/2018  Randall Mccall Dec 24, 1970 [id number]  Randall Mccall is an 47 y.o. male  Subjective:  HTN: Pt reports he continues to take her BP daily and able to recite most of his readings. Pt reports his BP are stable with this AM read at 128/74 with no signs or symptoms encountered since RN last home visit. Pt  States he is able to manage this condition with no reported issues.  ROM: Pt requested assist via [facility] RN on obtaining PT services. States he has requested this from his primary provider over the last few months however no call have been made.  DIETARY: Pt reports recent ED visit due to GOUT flare up and informed to change his diet to avoid starches and other food items that may contribute to his ongoing flare ups.  MEDICATION: Pt reports reason for his recent ED visit related to not having his colchicine "it was to early to fill" pt was informed by his pharmacy.   Objective:   Review of Systems  Constitutional: Negative.   HENT: Negative.   Eyes: Negative.   Respiratory: Negative.   Cardiovascular: Negative.   Gastrointestinal: Negative.   Genitourinary: Negative.   Musculoskeletal: Negative.   Skin: Negative.   Neurological: Positive for weakness.       Right side residual  Endo/Heme/Allergies: Negative.   Psychiatric/Behavioral: Negative.     Physical Exam  Constitutional: He is oriented to person, place, and time. He appears well-developed and well-nourished.  HENT:  Right Ear: External ear normal.  Left Ear: External ear normal.  Eyes: EOM are normal.  Neck: Normal range of motion.  Cardiovascular: Normal heart sounds.  Respiratory: Effort normal and breath sounds normal.  GI: Soft. Bowel sounds are normal.  Musculoskeletal: Normal range of motion.  Limited mobility on the right side.  Neurological: He is alert and oriented to person, place, and time.  Skin: Skin is warm and dry.  Psychiatric: He has a normal mood  and affect. His behavior is normal. Judgment and thought content normal.    Encounter Medications:   Outpatient Encounter Medications as of 05/23/2018  Medication Sig  . allopurinol (ZYLOPRIM) 100 MG tablet Take 1 tablet (100 mg total) by mouth daily.  Marland Kitchen amLODipine (NORVASC) 10 MG tablet Take 1 tablet (10 mg total) by mouth daily.  Marland Kitchen aspirin 325 MG tablet Take 1 tablet (325 mg total) by mouth daily.  Marland Kitchen atorvastatin (LIPITOR) 80 MG tablet Take 1 tablet (80 mg total) by mouth daily at 6 PM.  . Cholecalciferol (VITAMIN D3) 5000 units TABS Take 1 tablet by mouth daily.  . cloNIDine (CATAPRES) 0.1 MG tablet Take 1 tablet (0.1 mg total) by mouth 2 (two) times daily.  . colchicine 0.6 MG tablet Take 1 tablet (0.6 mg total) by mouth every hour as needed. Up to 4 in one 24-hour period, if needed for pain  . gabapentin (NEURONTIN) 300 MG capsule Take 300 mg by mouth at bedtime.  Marland Kitchen HYDROcodone-acetaminophen (NORCO/VICODIN) 5-325 MG tablet Take 1 tablet by mouth every 4 (four) hours as needed.  Marland Kitchen oxyCODONE-acetaminophen (PERCOCET/ROXICET) 5-325 MG tablet Take 1 tablet by mouth every 8 (eight) hours as needed for severe pain.  Marland Kitchen senna-docusate (SENOKOT-S) 8.6-50 MG tablet Take 1 tablet by mouth at bedtime as needed for mild constipation.  . predniSONE (STERAPRED UNI-PAK 21 TAB) 10 MG (21) TBPK tablet Take by mouth daily. Take 6 tabs by mouth daily  for 1 days, then  5 tabs for 2 days, then 4 tabs for 2 days, then 3 tabs for 2 days, 2 tabs for 2 days, then 1 tab by mouth daily for 2 days (Patient not taking: Reported on 05/23/2018)   No facility-administered encounter medications on file as of 05/23/2018.     Functional Status:   In your present state of health, do you have any difficulty performing the following activities: 03/03/2018  Hearing? N  Vision? N  Difficulty concentrating or making decisions? N  Walking or climbing stairs? Y  Dressing or bathing? Y  Doing errands, shopping? Y  Preparing Food  and eating ? N  Using the Toilet? N  In the past six months, have you accidently leaked urine? N  Do you have problems with loss of bowel control? N  Managing your Medications? N  Managing your Finances? N  Housekeeping or managing your Housekeeping? Y  Comment staying with mother  Some recent data might be hidden    Fall/Depression Screening:    Fall Risk  03/20/2018  Falls in the past year? No   PHQ 2/9 Scores 03/20/2018 03/03/2018  PHQ - 2 Score 0 1   BP 124/88 (BP Location: Left Arm, Patient Position: Sitting, Cuff Size: Normal)   Pulse 94   Resp 20   Ht 1.854 m ('6\' 1"' )   Wt 255 lb (115.7 kg)   SpO2 95%   BMI 33.64 kg/m  Assessment:   Ongoing case management related to HTN PT/OT requested due to limited moiblity via PCP request Dietary habits related to GOUT flare-ups Medication issues related to absence of the medication colchicine.   Plan:  Will discussed the plan of care related to HTN with all goal met and interventions discussed for ongoing self management of care. Will contact Dr. Vista Lawman and make the request known that pt is requesting PT/OT out patient therapy for his right side weakness.  Will discussed foods to avoid as noted on his discharge sheet to prevent GOUT flare-ups. WIll encouraged pt to view GOUT diet that he has access to on foods to avoid to improve his eating habits related to a GOUT diet. Will contact pt's pharmacy and request the needed refill on pt's Colchicine and any other medications. Will verify pick-up today from local pharmacy and strongly encouraged pt to take all medications as prescribed to avoid acute issues from occurring. Will inform pt of the risk involved if non-adherent to taking his daily medications.  THN CM Care Plan Problem One     Most Recent Value  Care Plan Problem One  Knowledge deficit related to HTN  Role Documenting the Problem One  Care Management Coordinator  Care Plan for Problem One  Active  THN Long Term Goal   Pt  will be more knowledgable on HTN and how to monitor this condition for prevention measures over the next 90 days.  THN Long Term Goal Start Date  03/20/18  THN Long Term Goal Met Date  05/23/18  THN CM Short Term Goal #1   Pt will take his BP twice weekly over the next 30 days  THN CM Short Term Goal #1 Start Date  03/20/18  [facility] CM Short Term Goal #1 Met Date  04/25/18  THN CM Short Term Goal #2   Pt will consume a healthier diet with low sodium food items to lower his risk over the next 30 days.  THN CM Short Term Goal #2 Start Date  03/20/18  Interventions for Short Term Goal #  2  WIll extend to allow adherence and discuss dietary foods to avoid  flare ups with pt's GOUT.     THN CM Care Plan Problem Two     Most Recent Value  Care Plan Problem Two  Limited ROM and mobility  Role Documenting the Problem Two  Care Management Coordinator  Care Plan for Problem Two  Active  THN CM Short Term Goal #1    Pt will be receptive to the out-patient therapy over the next 30 days  THN CM Short Term Goal #1 Start Date  05/23/18  [facility] CM Short Term Goal #1 Met Date   05/23/18  Interventions for Short Term Goal #2   WIll stress the importance of  arranging therapy for the residual right side weakness. Will stress early interventions to assist with strengthening exercises.     THN CM Care Plan Problem Three     Most Recent Value  Care Plan Problem Three  Medication adherence  (Pended)   Role Documenting the Problem Three  Care Management Coordinator  (Pended)   Care Plan for Problem Three  Active  (Pended)   THN CM Short Term Goal #1   Pt will adhere to taking all his prescribed medications to avoid acute events that may result in ED or hospitalization over the next 30 days.  (Pended)   THN CM Short Term Goal #1 Start Date  05/23/18  (Pended)   Interventions for Short Term Goal #1  Stress the importance of taking this medication to avoid GOUT signs or symptoms from occur. Will assist with contacting the  pharmacy and requested a refill on this medications. Will strongly enocurage pt to obtain this medication today.  (Pended)       Raina Mina, RN Care Management Coordinator [place] Office [phone]

## 2018-05-27 ENCOUNTER — Other Ambulatory Visit: Payer: Self-pay | Admitting: *Deleted

## 2018-05-27 NOTE — Patient Outreach (Signed)
[place] [facility]) Care Management  05/27/2018  Randall Mccall [birth year] [id number]    RN received a cal from pt who was very excited that his therapy would begin this week and is "right around the corner" from his home. Pt reports he is ready and just wanted this RN case manager to know the call made to his provider last week to initiate this services was successful. Pt very grateful and also reported his BP today was 134/71,  swelling has "gone down" related to his GOUT and he has obtained his GOUT medications as arranged by this RN case manager on the last home visit with his local pharmacy. No other interventions or issues to address at this time. RN will follow up in one month with a home visit to inquire further on his progress.   Plan of care discussed and updated accordingly. Will reassess on next home visit.   Raina Mina, RN Care Management Coordinator [place] Office [phone]

## 2018-05-29 ENCOUNTER — Telehealth: Payer: Self-pay | Admitting: Rehabilitation

## 2018-05-29 ENCOUNTER — Encounter: Payer: Self-pay | Admitting: Rehabilitation

## 2018-05-29 ENCOUNTER — Ambulatory Visit: Payer: Medicare Other | Attending: Internal Medicine | Admitting: Rehabilitation

## 2018-05-29 ENCOUNTER — Other Ambulatory Visit: Payer: Self-pay

## 2018-05-29 ENCOUNTER — Ambulatory Visit: Payer: Medicare Other | Admitting: Occupational Therapy

## 2018-05-29 DIAGNOSIS — R278 Other lack of coordination: Secondary | ICD-10-CM | POA: Diagnosis not present

## 2018-05-29 DIAGNOSIS — I69351 Hemiplegia and hemiparesis following cerebral infarction affecting right dominant side: Secondary | ICD-10-CM

## 2018-05-29 DIAGNOSIS — R2681 Unsteadiness on feet: Secondary | ICD-10-CM | POA: Diagnosis not present

## 2018-05-29 DIAGNOSIS — M6281 Muscle weakness (generalized): Secondary | ICD-10-CM | POA: Diagnosis not present

## 2018-05-29 DIAGNOSIS — I69318 Other symptoms and signs involving cognitive functions following cerebral infarction: Secondary | ICD-10-CM | POA: Insufficient documentation

## 2018-05-29 DIAGNOSIS — R2689 Other abnormalities of gait and mobility: Secondary | ICD-10-CM | POA: Diagnosis not present

## 2018-05-29 NOTE — Therapy (Signed)
[facility] [address] [place], [place], [address] Phone: [phone]   Fax:  [phone]  Occupational Therapy Evaluation  Patient Details  Name: Randall Mccall MRN: [id number] Date of Birth: 1971-07-12 Referring Provider (OT): Dr. Benito Mccreedy   Encounter Date: 05/29/2018  OT End of Session - 05/29/18 1208    Visit Number  1    Number of Visits  17    Date for OT Re-Evaluation  07/29/18    Authorization Type  UHC MCR primary, MCD secondary    OT Start Time  0930    OT Stop Time  1015    OT Time Calculation (min)  45 min    Activity Tolerance  Patient tolerated treatment well    Behavior During Therapy  [facility] for tasks assessed/performed       Past Medical History:  Diagnosis Date  . Gout   . Hypertension   . Muscle weakness   . Stroke [facility])     Past Surgical History:  Procedure Laterality Date  . KNEE SURGERY      There were no vitals filed for this visit.  Subjective Assessment - 05/29/18 0932    Patient is accompained by:  Family member   mom   Pertinent History  CVA Jan 2018. PMH: HTN, gout    Patient Stated Goals  Get my Rt hand better so I can drive    Currently in Pain?  No/denies        [facility] OT Assessment - 05/29/18 0934      Assessment   Medical Diagnosis  Old CVA    Referring Provider (OT)  Dr. Iona Beard Osei-Bonsu    Onset Date/Surgical Date  09/15/16    Hand Dominance  Right   but primarily using Lt hand since stroke   Prior Therapy  Acute, [facility] (SNF), some HHPT/OT      Precautions   Precautions  Fall      Balance Screen   Has the patient fallen in the past 6 months  --   see P.T. evaluation     Home  Environment   Bathroom Shower/Tub  Walk-in Shower;Curtain    Additional Comments  Pt lives w/ mother in 1 story home w/ level entry. DME: Shower chair, cane (but doesn't use)      Prior Function   Level of Independence  Independent   prior to CVA   Vocation  On  disability   Prior to stroke d/t mental challenges   Leisure  Used to like to hang out with friends but stays home.  Wants to get back to driving.      ADL   Eating/Feeding  Needs assist with cutting food   Lt hand to feed self   Grooming  --   Mod I w/ Lt hand   Upper Body Bathing  Modified independent    Lower Body Bathing  Modified independent    Upper Body Dressing  Needs assist for fasteners   Pt usually leaves button hooked on pants, slip on shoes   Toilet Transfer  Independent    Tub/Shower Transfer  Modified independent      IADL   Shopping  Needs to be accompanied on any shopping trip    Light Housekeeping  Performs light daily tasks such as dishwashing, bed making    Meal Prep  Able to complete simple cold meal and snack prep    Community Mobility  Relies on family or friends for transportation  Medication Management  Is responsible for taking medication in correct dosages at correct time      Mobility   Mobility Status  Independent      Written Expression   Dominant Hand  Right    Handwriting  --   prior to CVA, now uses Lt hand to sign name     Vision - History   Baseline Vision  No visual deficits    Additional Comments  no changes from stroke      Cognition   Cognition Comments  Pt with cognitive deficits (memory impaired per mother report), however pt was on disability prior to stroke due to "mental challenges"       Sensation   Additional Comments  Pt has intact light touch and localization RUE      Coordination   Box and Blocks  Rt = 17 (using primarily thumb and long finger, index finger flexed)     Coordination  Pt can pick up pen Rt hand w/ compensations in RUE and wrist flexion, but unable to manipulate in hand      Edema   Edema  NONE during evaluation      Tone   Assessment Location  Right Upper Extremity      ROM / Strength   AROM / PROM / Strength  AROM      AROM   Overall AROM Comments  LUE WNL's. RUE sh flex approx 75-90% w/ decr.  distal control, ER 90%, IR 75%. Elbow flexion WFL's, elbow ext w/ tone 2/4. Supination only to neutral, wrist past neutral w/ fingers flexed, full finger flex, 50% finger extension w/ more at long finger, however pt can achieve more finger extension w/ wrist in full flexion      RUE Tone   RUE Tone  Moderate;Modified Ashworth   distally      RUE Tone   Modified Ashworth Scale for Grading Hypertonia RUE  More marked increase in muscle tone through most of the ROM, but affected part(s) easily moved               OT Treatments/Exercises (OP) - 05/29/18 1218      ADLs   ADL Comments  Discussed O.T. POC. Also recommended discussing with MD possibility of botox to manage spasticity distal RUE. Pt sees neurologist next week and therapist wrote note to MD              OT Short Term Goals - 05/29/18 1211      OT SHORT TERM GOAL #1   Title  Independent with initial HEP - 06/29/18    Time  4    Period  Weeks    Status  New      OT SHORT TERM GOAL #2   Title  Independent with splint wear and care    Time  4    Period  Weeks    Status  New      OT SHORT TERM GOAL #3   Title  Pt/family to verbalize understanding with A/E recommendations to increase ease, independence and safety with ADLS/IADLS (rocker knife, shoe buttons, one handed cutting board, pot stabalizer)    Time  4    Period  Weeks    Status  New        OT Long Term Goals - 05/29/18 1213      OT LONG TERM GOAL #1   Title  Independent with updated HEP - 07/29/18    Time  8  Period  Weeks    Status  New      OT LONG TERM GOAL #2   Title  Pt to perform low to mid level reaching w/ sufficient finger extension for placement on cylindrical objects for grasp/release    Time  8    Period  Weeks    Status  New      OT LONG TERM GOAL #3   Title  Pt to improved RUE function as evidenced by performing 22 blocks or greater on Box & Blocks test    Baseline  eval = 17    Time  8    Period  Weeks    Status  New       OT LONG TERM GOAL #4   Title  Pt will consistently use RUE as stabalizer to min assist for low bilateral tasks    Time  8    Period  Weeks    Status  New            Plan - 05/29/18 1208    Clinical Impression Statement  Pt is a 47 y.o. male who presents to [facility] rehab with history of CVA on 09/15/16 and residual Rt dominant side hemiplegia. Pt never received outpatient therapy per family report.     Occupational Profile and client history currently impacting functional performance  CVA 09/15/16, HTN, gout    Occupational performance deficits (Please refer to evaluation for details):  ADL's;IADL's;Social Participation    Rehab Potential  Fair    Current Impairments/barriers affecting progress:  time since onset    OT Frequency  2x / week    OT Duration  8 weeks   plus evaluation   OT Treatment/Interventions  Self-care/ADL training;Moist Heat;DME and/or AE instruction;Splinting;Aquatic Therapy;Therapeutic activities;Cognitive remediation/compensation;Coping strategies training;Therapeutic exercise;Neuromuscular education;Functional Mobility Training;Passive range of motion;Visual/perceptual remediation/compensation;Electrical Stimulation;Manual Therapy;Patient/family education    Plan  NMR RUE/trunk    Clinical Decision Making  Several treatment options, min-mod task modification necessary       Patient will benefit from skilled therapeutic intervention in order to improve the following deficits and impairments:  Decreased coordination, Decreased range of motion, Impaired flexibility, Improper body mechanics, Improper spinal/pelvic alignment, Decreased safety awareness, Impaired tone, Decreased knowledge of use of DME, Decreased balance, Impaired UE functional use, Decreased cognition, Decreased mobility, Decreased strength, Impaired vision/preception  Visit Diagnosis: Hemiplegia and hemiparesis following cerebral infarction affecting right dominant side ([place]) - Plan: Ot plan of  care cert/re-cert  Unsteadiness on feet - Plan: Ot plan of care cert/re-cert  Other lack of coordination - Plan: Ot plan of care cert/re-cert  Other symptoms and signs involving cognitive functions following cerebral infarction - Plan: Ot plan of care cert/re-cert    Problem List Patient Active Problem List   Diagnosis Date Noted  . CKD (chronic kidney disease) 12/18/2016  . Benign essential HTN   . Idiopathic gout   . Diastolic dysfunction   . Cocaine abuse ([place])   . Right hemiparesis ([place])   . 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 Cerebrovascular accident (CVA) ([place])   . Acute ischemic stroke ([place]) 09/15/2016  . Stroke (cerebrum) ([place]) 09/15/2016  . Hypertension 09/15/2016  . Vitamin B12 deficiency 08/10/2015  . Weakness 07/02/2013  . Abnormal CPK 07/02/2013    Carey Bullocks, OTR/L 05/29/2018, 12:19 PM  [facility] [address] [place], [place], [address] Phone: [phone]   Fax:  [phone]  Name:  MICHIEL SIVLEY MRN: [id number] Date of Birth: [birth year]

## 2018-05-29 NOTE — Telephone Encounter (Signed)
Dr. Vista Lawman,   I just evaluated Mr. Randall Mccall for OP neuro PT s/p hx of CVA.  Note that he will likely need an AFO for RLE due to deficits in order to decrease fall risk and improve efficiency of gait.  Will you please write order in epic work que for R AFO with diagnosis of R hemiplegia from CVA or fax order to [phone].    Thanks,  Cameron Sprang, PT, MPT [facility] [address] [place], [place], [address] Phone: [phone]   Fax:  [phone] 05/29/18, 10:50 AM

## 2018-05-29 NOTE — Therapy (Signed)
[facility] [address] [place], [place], [address] Phone: [phone]   Fax:  [phone]  Physical Therapy Evaluation  Patient Details  Name: Randall Mccall MRN: [id number] Date of Birth: 12-03-1970 Referring Provider (PT): Benito Mccreedy, MD   Encounter Date: 05/29/2018  PT End of Session - 05/29/18 1028    Visit Number  1    Number of Visits  17    Date for PT Re-Evaluation  08/27/18   POC written for 60 days, cert written for 90 days   Authorization Type  UHC Medicare and Medicaid    PT Start Time  0845    PT Stop Time  0930    PT Time Calculation (min)  45 min    Activity Tolerance  Patient tolerated treatment well    Behavior During Therapy  [facility] for tasks assessed/performed       Past Medical History:  Diagnosis Date  . Gout   . Hypertension   . Muscle weakness   . Stroke [facility])     Past Surgical History:  Procedure Laterality Date  . KNEE SURGERY      There were no vitals filed for this visit.   Subjective Assessment - 05/29/18 0847    Subjective  "I want to work on my right side of my body.  When I got out of rehab, I got some HHPT and OT, and I was supposed to get more, but they signed off and said my visits ran out."     Patient is accompained by:  Family member   mother, Randall Mccall   Pertinent History  Pt goes by "[name] hold activities;Walking    How long can you stand comfortably?  about 15 mins    How long can you walk comfortably?  about 15 mins     Patient Stated Goals  "Work on my right side and get more balanced."     Currently in Pain?  No/denies         [facility] PT Assessment - 05/29/18 0850      Assessment   Medical Diagnosis  Old CVA    Referring Provider (PT)  Benito Mccreedy, MD    Onset Date/Surgical Date  09/15/16    Prior Therapy  Acute, [facility] (SNF), some HHPT/OT      Precautions   Precautions  Fall      Balance Screen   Has the patient  fallen in the past 6 months  No   reports some stumbles, no falls   Has the patient had a decrease in activity level because of a fear of falling?   No    Is the patient reluctant to leave their home because of a fear of falling?   No      Home Social worker  Private residence    Living Arrangements  Parent    Available Help at Discharge  Available 24 hours/day;Family    Type of [place] - single point;Shower seat   walk in shower, no lip   Additional Comments  Sits to shower       Prior Function   Level of Independence  Independent   prior to CVA   Vocation  Unemployed    Leisure  Used to like to hang out with friends but  stays home.  Wants to get back to driving.      Cognition   Overall Cognitive Status  Impaired/Different from baseline    Area of Impairment  Memory;Following commands   per mothers report   Memory  Decreased short-term memory    Following Command Comments  per mothers reports       Sensation   Light Touch  Impaired Detail    Light Touch Impaired Details  Impaired RUE;Impaired RLE   decreased light touch   Hot/Cold  Appears Intact    Proprioception  Appears Intact      Coordination   Gross Motor Movements are Fluid and Coordinated  Yes   grossly intact LEs   Fine Motor Movements are Fluid and Coordinated  Yes    Heel Shin Test  some decreased exursion, likely due to strength deficits      Tone   Assessment Location  Right Lower Extremity      ROM / Strength   AROM / PROM / Strength  Strength;AROM      Strength   Overall Strength  Deficits    Overall Strength Comments  R hip flex (seated) 4/5, R knee flex 3-/5, R ankle DF 3-/5 (note supination with active movement), R ankle PF 3/5.  All others grossly 4/5 and LLE 5/5 grossly       Transfers   Transfers  Sit to Stand;Stand to Sit    Sit to Stand  6: Modified independent (Device/Increase time)     Five time sit to stand comments   12.69 secs without UE support     Stand to Sit  6: Modified independent (Device/Increase time)      Ambulation/Gait   Ambulation/Gait  Yes    Ambulation/Gait Assistance  5: Supervision    Ambulation/Gait Assistance Details  Pt ambulates with RLE circumduction, R ankle PF and some ankle supination during swing phase of gait.     Ambulation Distance (Feet)  300 Feet    Assistive device  None    Gait Pattern  Decreased arm swing - right;Decreased stride length;Decreased stance time - right;Decreased step length - left;Decreased dorsiflexion - right;Decreased weight shift to right;Right circumduction;Right hip hike;Trunk flexed;Poor foot clearance - right    Ambulation Surface  Level;Indoor    Gait velocity  2.15 ft/sec     Stairs  Yes    Stairs Assistance  5: Supervision    Stair Management Technique  One rail Left;Step to pattern;Forwards    Number of Stairs  4    Height of Stairs  6      Standardized Balance Assessment   Standardized Balance Assessment  Dynamic Gait Index      Dynamic Gait Index   Level Surface  Mild Impairment    Change in Gait Speed  Mild Impairment    Gait with Horizontal Head Turns  Mild Impairment    Gait with Vertical Head Turns  Mild Impairment    Gait and Pivot Turn  Mild Impairment    Step Over Obstacle  Moderate Impairment    Step Around Obstacles  Mild Impairment    Steps  Moderate Impairment    Total Score  14    DGI comment:  Scores under 19 are predictive of falls in older community living adults       RLE Tone   RLE Tone  Mild;Modified Ashworth      RLE Tone   Modified Ashworth Scale for Grading Hypertonia RLE  Slight increase in muscle tone,  manifested by a catch and release or by minimal resistance at the end of the range of motion when the affected part(s) is moved in flexion or extension                Objective measurements completed on examination: See above findings.               PT Education - 05/29/18 1027    Education Details  goals, POC, evaluation findings, need for some sort of AFO for RLE    Person(s) Educated  Patient;Parent(s)    Methods  Explanation    Comprehension  Verbalized understanding       PT Short Term Goals - 05/29/18 1037      PT SHORT TERM GOAL #1   Title  Pt will initiate HEP in order to indicate improved functional mobility and decreased fall risk.  (Target Date: 06/28/18)    Time  4    Period  Weeks    Status  New    Target Date  06/28/18      PT SHORT TERM GOAL #2   Title  Pt will perform 5TSS in </=11 secs without UE support and good B LE weight bearing in order to indicate decreased fall risk.     Time  4    Period  Weeks    Status  New      PT SHORT TERM GOAL #3   Title  Pt will improve DGI to >/=17/24 in order to indicate decreased fall risk.     Time  4    Period  Weeks    Status  New      PT SHORT TERM GOAL #4   Title  Will assess need for RLE AFO and proceed appropriately.     Time  4    Period  Weeks    Status  New      PT SHORT TERM GOAL #5   Title  Pt will ambulate 500' over unlevel outdoor paved surfaces w/ LRAD (brace as needed) at mod I level in order to return to leisure and community activity.     Time  4    Period  Weeks    Status  New      Additional Short Term Goals   Additional Short Term Goals  Yes        PT Long Term Goals - 05/29/18 1040      PT LONG TERM GOAL #1   Title  Pt will be independnet with final HEP in order to indicate improved functional mobility and decreased fall risk.  (Target Date: 07/28/18)    Time  8    Period  Weeks    Status  New    Target Date  07/28/18      PT LONG TERM GOAL #2   Title  Pt will improve DGI to >19/24 in order to indicate decreased fall risk.      Time  8    Period  Weeks    Status  New      PT LONG TERM GOAL #3   Title  Pt will increase gait speed to >/=2.62 ft/sec in order to indictate safe community ambulation and  improved efficiency of gait.     Time  8    Period  Weeks    Status  New      PT LONG TERM GOAL #4   Title  Pt will ambulate 1000' over varying outdoor surfaces (both paved  and grassy, ramp/incline/curb) w/ LRAD (brace as needed) at mod I level in order to indicate safe community negotiation.     Time  8    Period  Weeks    Status  New             Plan - 05/29/18 1030    Clinical Impression Statement  Pt presents with history of CVA (lacunar) on 09/15/16 with D/C to [facility] followed by a brief stent of HHPT/OT, but never recieved OP PT/OT.  Pt has history of HTN, gout, and elevated cholesterol, which could impact progress in therapy.  Note during PT evaluation, R hemiplegia with mild tone in RUE/LE, decreased balance and generalized weakness with poor endurance.  Note 5TSS to 12.69 secs without UE support indicative of slightly elevated fall risk (>12 secs), gait speed of 2.15 ft/sec indicative of limited community ambulator, and DGI score of 14/24 indicative of elevated fall risk.  Pt will benefit from skilled OP neuro PT in order to address deficits.     History and Personal Factors relevant to plan of care:  see above    Clinical Presentation  Stable    Clinical Presentation due to:  see above    Clinical Decision Making  Moderate    Rehab Potential  Good    Clinical Impairments Affecting Rehab Potential  length of time since CVA    PT Frequency  2x / week    PT Duration  8 weeks    PT Treatment/Interventions  ADLs/Self Care Home Management;Electrical Stimulation;DME Instruction;Gait training;Stair training;Functional mobility training;Therapeutic activities;Therapeutic exercise;Aquatic Therapy;Balance training;Neuromuscular re-education;Cognitive remediation;Patient/family education;Orthotic Fit/Training;Passive range of motion;Energy conservation;Vestibular    PT Next Visit Plan  Initiate formal HEP for RLE strengthening (verbally provided and demonstrated runner's  stretch-re-inforce and add picture to HEP), Bion[place]?, brace assessment as able for RLE, RNMR    Consulted and Agree with Plan of Care  Patient;Family member/caregiver    Family Member Consulted  Mother Randall Mccall       Patient will benefit from skilled therapeutic intervention in order to improve the following deficits and impairments:  Abnormal gait, Decreased activity tolerance, Decreased balance, Decreased coordination, Decreased endurance, Decreased knowledge of use of DME, Decreased mobility, Decreased range of motion, Decreased strength, Impaired perceived functional ability, Impaired flexibility, Postural dysfunction, Improper body mechanics, Impaired UE functional use, Impaired tone, Impaired sensation  Visit Diagnosis: Hemiplegia and hemiparesis following cerebral infarction affecting right dominant side ([facility])  Unsteadiness on feet  Other abnormalities of gait and mobility  Muscle weakness (generalized)     Problem List Patient Active Problem List   Diagnosis Date Noted  . CKD (chronic kidney disease) 12/18/2016  . Benign essential HTN   . Idiopathic gout   . Diastolic dysfunction   . Cocaine abuse ([place])   . Right hemiparesis ([place])   . Monoplegia of upper extremity following cerebral infarction affecting right dominant side ([place])   . Cerebrovascular accident (CVA) ([place])   . Acute ischemic stroke ([place]) 09/15/2016  . Stroke (cerebrum) ([place]) 09/15/2016  . Hypertension 09/15/2016  . Vitamin B12 deficiency 08/10/2015  . Weakness 07/02/2013  . Abnormal CPK 07/02/2013    Cameron Sprang, PT, MPT [facility] [address] [place], [place], [address] Phone: [phone]   Fax:  [phone] 05/29/18, 10:45 AM  Name: Randall Mccall MRN: [id number] Date of Birth: 1971/05/23

## 2018-06-02 ENCOUNTER — Ambulatory Visit: Payer: Medicare Other | Admitting: Occupational Therapy

## 2018-06-02 ENCOUNTER — Ambulatory Visit: Payer: Medicare Other | Admitting: Physical Therapy

## 2018-06-02 ENCOUNTER — Encounter: Payer: Self-pay | Admitting: Physical Therapy

## 2018-06-02 DIAGNOSIS — R2681 Unsteadiness on feet: Secondary | ICD-10-CM

## 2018-06-02 DIAGNOSIS — R278 Other lack of coordination: Secondary | ICD-10-CM | POA: Diagnosis not present

## 2018-06-02 DIAGNOSIS — I69351 Hemiplegia and hemiparesis following cerebral infarction affecting right dominant side: Secondary | ICD-10-CM

## 2018-06-02 DIAGNOSIS — M6281 Muscle weakness (generalized): Secondary | ICD-10-CM | POA: Diagnosis not present

## 2018-06-02 DIAGNOSIS — I69318 Other symptoms and signs involving cognitive functions following cerebral infarction: Secondary | ICD-10-CM | POA: Diagnosis not present

## 2018-06-02 DIAGNOSIS — R2689 Other abnormalities of gait and mobility: Secondary | ICD-10-CM | POA: Diagnosis not present

## 2018-06-02 NOTE — Therapy (Signed)
[facility] [address] [place], [place], [address] Phone: [phone]   Fax:  [phone]  Occupational Therapy Treatment  Patient Details  Name: Randall Mccall MRN: [id number] Date of Birth: [birth year] Referring Provider (OT): Dr. Benito Mccreedy   Encounter Date: 06/02/2018  OT End of Session - 06/02/18 1414    Visit Number  2    Number of Visits  17    Date for OT Re-Evaluation  07/29/18    Authorization Type  UHC MCR primary, MCD secondary    OT Start Time  1315    OT Stop Time  1400    OT Time Calculation (min)  45 min    Activity Tolerance  Patient tolerated treatment well    Behavior During Therapy  [facility] for tasks assessed/performed       Past Medical History:  Diagnosis Date  . Gout   . Hypertension   . Muscle weakness   . Stroke [facility])     Past Surgical History:  Procedure Laterality Date  . KNEE SURGERY      There were no vitals filed for this visit.  Subjective Assessment - 06/02/18 1319    Subjective   I wish I could go back to driving    Pertinent History  CVA Jan 2018. PMH: HTN, gout    Patient Stated Goals  Get my Rt hand better so I can drive    Currently in Pain?  No/denies                   OT Treatments/Exercises (OP) - 06/02/18 1409      ADLs   ADL Comments  Pt reports he drove prior to stroke but has not driven since stroke because of his Rt side. Therapist discussed options for adapting vehicle which would allow him to operate gas/brake w/ Lt foot if cleared by MD to drive otherwise. Pt given contact info and verbalized understanding      Neurological Re-education Exercises   Other Exercises 1  Supine: bilateral sh. flexion holding thick pool noodle w/ decreased rotation RUE to maintain contact at palm.     Other Exercises 2  Seated: wt bearing as able on LUE with body on arm movements in trunk rotation and lateral trunk flexion with ipsilateral reaching - pt  required assist to maintain wt bearing d/t increased tone distally and decreased wrist/finger extension together. Progressed to AA/ROM in abduction w/ arm supported and then in low range unsupported. Bilateral closed chain sh flexion body on arm, and body with arm movements.  Cues throughout session re: posture and trunk control               OT Short Term Goals - 05/29/18 1211      OT SHORT TERM GOAL #1   Title  Independent with initial HEP - 06/29/18    Time  4    Period  Weeks    Status  New      OT SHORT TERM GOAL #2   Title  Independent with splint wear and care    Time  4    Period  Weeks    Status  New      OT SHORT TERM GOAL #3   Title  Pt/family to verbalize understanding with A/E recommendations to increase ease, independence and safety with ADLS/IADLS (rocker knife, shoe buttons, one handed cutting board, pot stabalizer)    Time  4    Period  Weeks  Status  New        OT Long Term Goals - 05/29/18 1213      OT LONG TERM GOAL #1   Title  Independent with updated HEP - 07/29/18    Time  8    Period  Weeks    Status  New      OT LONG TERM GOAL #2   Title  Pt to perform low to mid level reaching w/ sufficient finger extension for placement on cylindrical objects for grasp/release    Time  8    Period  Weeks    Status  New      OT LONG TERM GOAL #3   Title  Pt to improved RUE function as evidenced by performing 22 blocks or greater on Box & Blocks test    Baseline  eval = 17    Time  8    Period  Weeks    Status  New      OT LONG TERM GOAL #4   Title  Pt will consistently use RUE as stabalizer to min assist for low bilateral tasks    Time  8    Period  Weeks    Status  New            Plan - 06/02/18 1414    Clinical Impression Statement  Pt with greater understanding of correct posture while seated and normal vs. abnormal movement patterns    Occupational Profile and client history currently impacting functional performance  CVA 09/15/16,  HTN, gout    Occupational performance deficits (Please refer to evaluation for details):  ADL's;IADL's;Social Participation    Rehab Potential  Fair    OT Frequency  2x / week    OT Duration  8 weeks    OT Treatment/Interventions  Self-care/ADL training;Moist Heat;DME and/or AE instruction;Splinting;Aquatic Therapy;Therapeutic activities;Cognitive remediation/compensation;Coping strategies training;Therapeutic exercise;Neuromuscular education;Functional Mobility Training;Passive range of motion;Visual/perceptual remediation/compensation;Electrical Stimulation;Manual Therapy;Patient/family education    Plan  continue NMR RUE/trunk, issue HEP as able    Consulted and Agree with Plan of Care  Patient       Patient will benefit from skilled therapeutic intervention in order to improve the following deficits and impairments:  Decreased coordination, Decreased range of motion, Impaired flexibility, Improper body mechanics, Improper spinal/pelvic alignment, Decreased safety awareness, Impaired tone, Decreased knowledge of use of DME, Decreased balance, Impaired UE functional use, Decreased cognition, Decreased mobility, Decreased strength, Impaired vision/preception  Visit Diagnosis: Hemiplegia and hemiparesis following cerebral infarction affecting right dominant side (HCC)  Unsteadiness on feet    Problem List Patient Active Problem List   Diagnosis Date Noted  . CKD (chronic kidney disease) 12/18/2016  . Benign essential HTN   . Idiopathic gout   . Diastolic dysfunction   . Cocaine abuse ([place])   . Right hemiparesis ([place])   . 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 Cerebrovascular accident (CVA) ([place])   . Acute ischemic stroke ([place]) 09/15/2016  . Stroke (cerebrum) ([place]) 09/15/2016  . Hypertension 09/15/2016  . Vitamin B12 deficiency 08/10/2015  . Weakness 07/02/2013  . Abnormal CPK 07/02/2013    Carey Bullocks,  OTR/L 06/02/2018, 2:15 PM  [facility] [address] [place], [place], [address] Phone: [phone]   Fax:  [phone]  Name: Randall Mccall MRN: [id number] Date of Birth: 05-26-1971

## 2018-06-02 NOTE — Therapy (Signed)
[facility] [address] [place], [place], [address] Phone: [phone]   Fax:  [phone]  Physical Therapy Treatment  Patient Details  Name: Randall Mccall MRN: [id number] Date of Birth: 21-Feb-1971 Referring Provider (PT): Randall Mccreedy, MD   Encounter Date: 06/02/2018  PT End of Session - 06/02/18 1108    Visit Number  2    Number of Visits  17    Date for PT Re-Evaluation  08/27/18   POC written for 60 days, cert written for 90 days   Authorization Type  UHC Medicare and Medicaid    PT Start Time  1106    PT Stop Time  1146    PT Time Calculation (min)  40 min    Equipment Utilized During Treatment  Gait belt    Activity Tolerance  Patient tolerated treatment well    Behavior During Therapy  [facility] for tasks assessed/performed       Past Medical History:  Diagnosis Date  . Gout   . Hypertension   . Muscle weakness   . Stroke [facility])     Past Surgical History:  Procedure Laterality Date  . KNEE SURGERY      There were no vitals filed for this visit.  Subjective Assessment - 06/02/18 1107    Subjective  No new complaints. No falls to report.     Patient is accompained by:  Family member   mother, Randall Mccall   Pertinent History  Pt goes by "[name] hold activities;Walking    How long can you stand comfortably?  about 15 mins    How long can you walk comfortably?  about 15 mins     Patient Stated Goals  "Work on my right side and get more balanced."     Currently in Pain?  No/denies    Pain Score  0-No pain           [facility] Adult PT Treatment/Exercise - 06/02/18 1121      Transfers   Transfers  Sit to Stand;Stand to Sit    Sit to Stand  6: Modified independent (Device/Increase time)    Stand to Sit  6: Modified independent (Device/Increase time)      Ambulation/Gait   Ambulation/Gait  Yes    Ambulation/Gait Assistance  5: Supervision;4: Min guard    Ambulation/Gait  Assistance Details  into/out of gym with no AD with decr stance on right, decr foot clearance and decreased right ankle control with swing phase. utilized Bioness to right LE for gait in session with improved DF, however pt continues to demo supination. May need to try steering electrodes for more ankle/foot control with gait.     Ambulation Distance (Feet)  115 Feet   x2, 100 x2   Assistive device  None;Other (Comment)    Gait Pattern  Decreased arm swing - right;Decreased stride length;Decreased stance time - right;Decreased step length - left;Decreased dorsiflexion - right;Decreased weight shift to right;Right circumduction;Right hip hike;Trunk flexed;Poor foot clearance - right    Ambulation Surface  Level;Indoor      Neuro Re-ed    Neuro Re-ed Details   at bottom of steps: right foot on bottom step- lifitng left foot up in air with on time of 5 sec's, resting with off time of 8 sec's for 10 reps. emphasis on use of right LE with UE's only for balance. min guard assist for balance with cues on ex form.  Exercises   Exercises  Other Exercises    Other Exercises   issued exercises to HEP for right LE strengthening. cues on ex form needed. refer to pt instructions for full details.       Acupuncturist Location  right anterior tib and hamstrings    Electrical Stimulation Action  for increased muscle activation for improved gait mechanics, stance stability on right LE and muscle strenghtening.     Electrical Stimulation Parameters  refer to tablet 1 for adjusted parameters; quick fit electrodes, may need steering for more control     Electrical Stimulation Goals  Strength;Neuromuscular facilitation           PT Short Term Goals - 05/29/18 1037      PT SHORT TERM GOAL #1   Title  Pt will initiate HEP in order to indicate improved functional mobility and decreased fall risk.  (Target Date: 06/28/18)    Time  4     Period  Weeks    Status  New    Target Date  06/28/18      PT SHORT TERM GOAL #2   Title  Pt will perform 5TSS in </=11 secs without UE support and good B LE weight bearing in order to indicate decreased fall risk.     Time  4    Period  Weeks    Status  New      PT SHORT TERM GOAL #3   Title  Pt will improve DGI to >/=17/24 in order to indicate decreased fall risk.     Time  4    Period  Weeks    Status  New      PT SHORT TERM GOAL #4   Title  Will assess need for RLE AFO and proceed appropriately.     Time  4    Period  Weeks    Status  New      PT SHORT TERM GOAL #5   Title  Pt will ambulate 500' over unlevel outdoor paved surfaces w/ LRAD (brace as needed) at mod I level in order to return to leisure and community activity.     Time  4    Period  Weeks    Status  New      Additional Short Term Goals   Additional Short Term Goals  Yes        PT Long Term Goals - 05/29/18 1040      PT LONG TERM GOAL #1   Title  Pt will be independnet with final HEP in order to indicate improved functional mobility and decreased fall risk.  (Target Date: 07/28/18)    Time  8    Period  Weeks    Status  New    Target Date  07/28/18      PT LONG TERM GOAL #2   Title  Pt will improve DGI to >19/24 in order to indicate decreased fall risk.      Time  8    Period  Weeks    Status  New      PT LONG TERM GOAL #3   Title  Pt will increase gait speed to >/=2.62 ft/sec in order to indictate safe community ambulation and improved efficiency of gait.     Time  8    Period  Weeks    Status  New      PT LONG TERM GOAL #4   Title  Pt will ambulate 1000'  over varying outdoor surfaces (both paved and grassy, ramp/incline/curb) w/ LRAD (brace as needed) at mod I level in order to indicate safe community negotiation.     Time  8    Period  Weeks    Status  New            Plan - 06/02/18 1108    Clinical Impression Statement  Today's skilled session initially focused on establishment  of an HEP for LE strengthening. Remainder of session focused on initial set up of Bioness for right LE to assist with improved DF, increased knee flexion with swing phase and knee control assist with stance phase. Also utilized Bioness with strenghtening/NMR exercises with no issues reported or noted in session. The pt is progressing toward goals and should benefit from continued PT to progress toward unmet goals.     Rehab Potential  Good    Clinical Impairments Affecting Rehab Potential  length of time since CVA    PT Frequency  2x / week    PT Duration  8 weeks    PT Treatment/Interventions  ADLs/Self Care Home Management;Electrical Stimulation;DME Instruction;Gait training;Stair training;Functional mobility training;Therapeutic activities;Therapeutic exercise;Aquatic Therapy;Balance training;Neuromuscular re-education;Cognitive remediation;Patient/family education;Orthotic Fit/Training;Passive range of motion;Energy conservation;Vestibular    PT Next Visit Plan  add balance components to HEP (dynamic with UE support at counter), continue with Bioness for gait/balance/strengthening, NMR for right LE- tall kneeling, half kneeling activities with emphasis on glut/hamstring activation    Consulted and Agree with Plan of Care  Patient;Family member/caregiver    Family Member Consulted  Mother Randall Mccall       Patient will benefit from skilled therapeutic intervention in order to improve the following deficits and impairments:  Abnormal gait, Decreased activity tolerance, Decreased balance, Decreased coordination, Decreased endurance, Decreased knowledge of use of DME, Decreased mobility, Decreased range of motion, Decreased strength, Impaired perceived functional ability, Impaired flexibility, Postural dysfunction, Improper body mechanics, Impaired UE functional use, Impaired tone, Impaired sensation  Visit Diagnosis: Hemiplegia and hemiparesis following cerebral infarction affecting right dominant side  (HCC)  Unsteadiness on feet  Other abnormalities of gait and mobility  Muscle weakness (generalized)     Problem List Patient Active Problem List   Diagnosis Date Noted  . CKD (chronic kidney disease) 12/18/2016  . Benign essential HTN   . Idiopathic gout   . Diastolic dysfunction   . Cocaine abuse ([place])   . Right hemiparesis ([place])   . Monoplegia of upper extremity following cerebral infarction affecting right dominant side ([place])   . Cerebrovascular accident (CVA) ([place])   . Acute ischemic stroke ([place]) 09/15/2016  . Stroke (cerebrum) ([place]) 09/15/2016  . Hypertension 09/15/2016  . Vitamin B12 deficiency 08/10/2015  . Weakness 07/02/2013  . Abnormal CPK 07/02/2013    Willow Ora, PTA, [facility] Outpatient Neuro [facility] [address], [place], [place] [address] [phone] 06/02/18, 9:01 PM   Name: Randall Mccall MRN: [id number] Date of Birth: 1971-05-22

## 2018-06-02 NOTE — Patient Instructions (Signed)
Access Code: [email or web address]  URL: [email or web address]  Date: 06/02/2018  Prepared by: Willow Ora   Exercises  Standing Gastroc Stretch - 3 reps - 1 sets - 30 hold - 1x daily - 7x weekly  Sit to Stand - 10 reps - 1 sets - 1x daily - 5x weekly  Heel rises with counter support - 10 reps - 1 sets - 5 hold - 1x daily - 5x weekly  Standing Single Leg Stance with Unilateral Counter Support - 3 reps - 1 sets - 10 hold - 1x daily - 5x weekly

## 2018-06-04 DIAGNOSIS — I10 Essential (primary) hypertension: Secondary | ICD-10-CM | POA: Diagnosis not present

## 2018-06-04 DIAGNOSIS — I69351 Hemiplegia and hemiparesis following cerebral infarction affecting right dominant side: Secondary | ICD-10-CM | POA: Diagnosis not present

## 2018-06-04 DIAGNOSIS — E785 Hyperlipidemia, unspecified: Secondary | ICD-10-CM | POA: Diagnosis not present

## 2018-06-04 DIAGNOSIS — I672 Cerebral atherosclerosis: Secondary | ICD-10-CM | POA: Diagnosis not present

## 2018-06-05 DIAGNOSIS — E785 Hyperlipidemia, unspecified: Secondary | ICD-10-CM | POA: Diagnosis not present

## 2018-06-05 DIAGNOSIS — Z8673 Personal history of transient ischemic attack (TIA), and cerebral infarction without residual deficits: Secondary | ICD-10-CM | POA: Diagnosis not present

## 2018-06-05 DIAGNOSIS — E559 Vitamin D deficiency, unspecified: Secondary | ICD-10-CM | POA: Diagnosis not present

## 2018-06-05 DIAGNOSIS — Z23 Encounter for immunization: Secondary | ICD-10-CM | POA: Diagnosis not present

## 2018-06-05 DIAGNOSIS — M1009 Idiopathic gout, multiple sites: Secondary | ICD-10-CM | POA: Diagnosis not present

## 2018-06-05 DIAGNOSIS — R7303 Prediabetes: Secondary | ICD-10-CM | POA: Diagnosis not present

## 2018-06-05 DIAGNOSIS — I10 Essential (primary) hypertension: Secondary | ICD-10-CM | POA: Diagnosis not present

## 2018-06-09 DIAGNOSIS — I639 Cerebral infarction, unspecified: Secondary | ICD-10-CM | POA: Diagnosis not present

## 2018-06-09 DIAGNOSIS — H538 Other visual disturbances: Secondary | ICD-10-CM | POA: Diagnosis not present

## 2018-06-09 DIAGNOSIS — H2511 Age-related nuclear cataract, right eye: Secondary | ICD-10-CM | POA: Diagnosis not present

## 2018-06-10 ENCOUNTER — Ambulatory Visit: Payer: Medicare Other | Admitting: Occupational Therapy

## 2018-06-10 DIAGNOSIS — R2689 Other abnormalities of gait and mobility: Secondary | ICD-10-CM | POA: Diagnosis not present

## 2018-06-10 DIAGNOSIS — R2681 Unsteadiness on feet: Secondary | ICD-10-CM | POA: Diagnosis not present

## 2018-06-10 DIAGNOSIS — R278 Other lack of coordination: Secondary | ICD-10-CM | POA: Diagnosis not present

## 2018-06-10 DIAGNOSIS — M6281 Muscle weakness (generalized): Secondary | ICD-10-CM | POA: Diagnosis not present

## 2018-06-10 DIAGNOSIS — I69351 Hemiplegia and hemiparesis following cerebral infarction affecting right dominant side: Secondary | ICD-10-CM | POA: Diagnosis not present

## 2018-06-10 DIAGNOSIS — I69318 Other symptoms and signs involving cognitive functions following cerebral infarction: Secondary | ICD-10-CM | POA: Diagnosis not present

## 2018-06-10 NOTE — Therapy (Signed)
[facility] [address] [place], [place], [address] Phone: [phone]   Fax:  [phone]  Occupational Therapy Treatment  Patient Details  Name: Randall Mccall MRN: [id number] Date of Birth: [birth year] Referring Provider (OT): Dr. Benito Mccreedy   Encounter Date: 06/10/2018  OT End of Session - 06/10/18 1412    Visit Number  3    Number of Visits  17    Date for OT Re-Evaluation  07/29/18    Authorization Type  UHC MCR primary, MCD secondary    OT Start Time  1105    OT Stop Time  1148    OT Time Calculation (min)  43 min    Activity Tolerance  Patient tolerated treatment well    Behavior During Therapy  [facility] for tasks assessed/performed       Past Medical History:  Diagnosis Date  . Gout   . Hypertension   . Muscle weakness   . Stroke [facility])     Past Surgical History:  Procedure Laterality Date  . KNEE SURGERY      There were no vitals filed for this visit.  Subjective Assessment - 06/10/18 1109    Patient is accompained by:  Family member    Pertinent History  CVA Jan 2018. PMH: HTN, gout    Patient Stated Goals  Get my Rt hand better so I can drive    Currently in Pain?  No/denies        Neuro re-education:  Seated: body on arm movements wt bearing over RUE for lateral wt shifts and bilateral trunk rotation. Progressed to supported AA/ROM in abduction mid ranges and unsupported low ranges for functional reaching tasks. Closed chain shoulder flexion (through table slides) to reach for and grasp cup, bring to mouth, replace on table, and slide back out to release.  Sit to squat position in BUE wt bearing w/ support provided to Rt wrist                   OT Education - 06/10/18 1145    Education Details  NMR HEP    Person(s) Educated  Patient;Parent(s)    Methods  Explanation;Demonstration;Handout    Comprehension  Verbalized understanding;Returned demonstration       OT  Short Term Goals - 06/10/18 1413      OT SHORT TERM GOAL #1   Title  Independent with initial HEP - 06/29/18    Time  4    Period  Weeks    Status  On-going      OT SHORT TERM GOAL #2   Title  Independent with splint wear and care    Time  4    Period  Weeks    Status  New      OT SHORT TERM GOAL #3   Title  Pt/family to verbalize understanding with A/E recommendations to increase ease, independence and safety with ADLS/IADLS (rocker knife, shoe buttons, one handed cutting board, pot stabalizer)    Time  4    Period  Weeks    Status  New        OT Long Term Goals - 05/29/18 1213      OT LONG TERM GOAL #1   Title  Independent with updated HEP - 07/29/18    Time  8    Period  Weeks    Status  New      OT LONG TERM GOAL #2   Title  Pt to perform  low to mid level reaching w/ sufficient finger extension for placement on cylindrical objects for grasp/release    Time  8    Period  Weeks    Status  New      OT LONG TERM GOAL #3   Title  Pt to improved RUE function as evidenced by performing 22 blocks or greater on Box & Blocks test    Baseline  eval = 17    Time  8    Period  Weeks    Status  New      OT LONG TERM GOAL #4   Title  Pt will consistently use RUE as stabalizer to min assist for low bilateral tasks    Time  8    Period  Weeks    Status  New            Plan - 06/10/18 1413    Clinical Impression Statement  Pt with improved RUE function during low range activities    Occupational Profile and client history currently impacting functional performance  CVA 09/15/16, HTN, gout    Occupational performance deficits (Please refer to evaluation for details):  ADL's;IADL's;Social Participation    Rehab Potential  Fair    Current Impairments/barriers affecting progress:  time since onset    OT Frequency  2x / week    OT Duration  8 weeks    OT Treatment/Interventions  Self-care/ADL training;Moist Heat;DME and/or AE instruction;Splinting;Aquatic  Therapy;Therapeutic activities;Cognitive remediation/compensation;Coping strategies training;Therapeutic exercise;Neuromuscular education;Functional Mobility Training;Passive range of motion;Visual/perceptual remediation/compensation;Electrical Stimulation;Manual Therapy;Patient/family education    Plan  continue NMR RUE/trunk, review HEP prn    Consulted and Agree with Plan of Care  Patient;Family member/caregiver    Family Member Consulted  mom       Patient will benefit from skilled therapeutic intervention in order to improve the following deficits and impairments:  Decreased coordination, Decreased range of motion, Impaired flexibility, Improper body mechanics, Improper spinal/pelvic alignment, Decreased safety awareness, Impaired tone, Decreased knowledge of use of DME, Decreased balance, Impaired UE functional use, Decreased cognition, Decreased mobility, Decreased strength, Impaired vision/preception  Visit Diagnosis: Hemiplegia and hemiparesis following cerebral infarction affecting right dominant side [facility])    Problem List Patient Active Problem List   Diagnosis Date Noted  . CKD (chronic kidney disease) 12/18/2016  . Benign essential HTN   . Idiopathic gout   . Diastolic dysfunction   . Cocaine abuse ([place])   . Right hemiparesis ([place])   . Monoplegia of upper extremity following cerebral infarction affecting right dominant side ([place])   . Cerebrovascular accident (CVA) ([place])   . Acute ischemic stroke ([place]) 09/15/2016  . Stroke (cerebrum) ([place]) 09/15/2016  . Hypertension 09/15/2016  . Vitamin B12 deficiency 08/10/2015  . Weakness 07/02/2013  . Abnormal CPK 07/02/2013    Carey Bullocks, OTR/L 06/10/2018, 2:15 PM  [facility] [address] [place], [place], [address] Phone: [phone]   Fax:  [phone]  Name: Randall Mccall MRN: [id number] Date of Birth: [birth year]

## 2018-06-10 NOTE — Patient Instructions (Signed)
Flexion (Assistive)    Lying down, hold wrist with other hand so that thumb side is up, raise arms above head, keeping elbows as straight as possible. Repeat _10___ times. Do __2__ sessions per day.  SUPINE: Shoulder Flexion Bilateral (Cane)    Lie on back with knees bent. Hold cane with both hands. Raise both arms overhead, keep elbows straight. _10__ reps per set, _2__ sets per day  Cranial Flexion: Overhead Arm Extension - Supine (Medicine Diona Foley)    Lie with knees bent, start with elbows bent, straighten towards ceiling (chest press motion). Hold paper towel roll Repeat __10__ times per set. Do _2___ sets per session.   Flexion (Passive)    Sitting upright, slide forearm forward along table, leaning forward. Grab cup, slide back, lift to drink, then place back on table, slide cup forward and release. Repeat _5___ times. Do _2___ sessions per day.  SITTING: Forward Weight Shift    Sit upright, place both hands on sitting surface. Lean chest forward, keep back straight. Hold _5__ seconds. _10__ reps per set, _2__ sets per day. Place wedge under hand if wrist range of motion is limited. GET HELP FROM MOM TO HOLD WRIST DOWN

## 2018-06-12 ENCOUNTER — Ambulatory Visit: Payer: Medicare Other | Admitting: Physical Therapy

## 2018-06-12 ENCOUNTER — Ambulatory Visit: Payer: Medicare Other | Admitting: Occupational Therapy

## 2018-06-12 ENCOUNTER — Encounter: Payer: Self-pay | Admitting: Physical Therapy

## 2018-06-12 DIAGNOSIS — I69351 Hemiplegia and hemiparesis following cerebral infarction affecting right dominant side: Secondary | ICD-10-CM

## 2018-06-12 DIAGNOSIS — R2689 Other abnormalities of gait and mobility: Secondary | ICD-10-CM

## 2018-06-12 DIAGNOSIS — M6281 Muscle weakness (generalized): Secondary | ICD-10-CM | POA: Diagnosis not present

## 2018-06-12 DIAGNOSIS — R2681 Unsteadiness on feet: Secondary | ICD-10-CM

## 2018-06-12 DIAGNOSIS — I69318 Other symptoms and signs involving cognitive functions following cerebral infarction: Secondary | ICD-10-CM | POA: Diagnosis not present

## 2018-06-12 DIAGNOSIS — R278 Other lack of coordination: Secondary | ICD-10-CM | POA: Diagnosis not present

## 2018-06-12 NOTE — Patient Instructions (Signed)
Access Code: [email or web address]  URL: [email or web address]  Date: 06/12/2018  Prepared by: Willow Ora   Exercises  Standing Gastroc Stretch - 3 reps - 1 sets - 30 hold - 1x daily - 7x weekly  Sit to Stand - 10 reps - 1 sets - 1x daily - 5x weekly  Heel rises with counter support - 10 reps - 1 sets - 5 hold - 1x daily - 5x weekly  Standing Single Leg Stance with Unilateral Counter Support - 3 reps - 1 sets - 10 hold - 1x daily - 5x weekly  Tandem Walking with Counter Support - 3 reps - 1 sets - 1x daily - 5x weekly  Walking March - 3 reps - 1 sets - 1x daily - 5x weekly  Side Stepping with Counter Support - 3 reps - 1 sets - 1x daily - 5x weekly  Toe Walking with Counter Support - 3 reps - 1 sets - 1x daily - 5x weekly

## 2018-06-12 NOTE — Therapy (Signed)
[facility] [address] [place], [place], [address] Phone: [phone]   Fax:  [phone]  Occupational Therapy Treatment  Patient Details  Name: Randall Mccall MRN: [id number] Date of Birth: Oct 07, 1970 Referring Provider (OT): Dr. Benito Mccreedy   Encounter Date: 06/12/2018  OT End of Session - 06/12/18 1306    Visit Number  4    Number of Visits  17    Date for OT Re-Evaluation  07/29/18    Authorization Type  UHC MCR primary, MCD secondary    OT Start Time  1015    OT Stop Time  1100    OT Time Calculation (min)  45 min    Activity Tolerance  Patient tolerated treatment well    Behavior During Therapy  [facility] for tasks assessed/performed       Past Medical History:  Diagnosis Date  . Gout   . Hypertension   . Muscle weakness   . Stroke [facility])     Past Surgical History:  Procedure Laterality Date  . KNEE SURGERY      There were no vitals filed for this visit.  Subjective Assessment - 06/12/18 1021    Pertinent History  CVA Jan 2018. PMH: HTN, gout    Patient Stated Goals  Get my Rt hand better so I can drive    Currently in Pain?  No/denies       Treatment:  BUE wt bearing seated for forward trunk flexion, followed by sit to squat activities.  Sidelying: guided AA/ROM in RUE abduction and forward sh flexion w/ cues to avoid compensations. Sidelying on hemiplegic side coming onto elbow for wt bearing and scapula depression.  Seated: AA/ROM followed by low range open chain abduction w/ functional reaching Standing: wt shifts w/ functional reaching in guided high range AA/ROM sh abduction, followed by cross reaching and ipsilateral low reaching with bilateral trunk rotation Reviewed previously issued NMR HEP                     OT Short Term Goals - 06/10/18 1413      OT SHORT TERM GOAL #1   Title  Independent with initial HEP - 06/29/18    Time  4    Period  Weeks    Status   On-going      OT SHORT TERM GOAL #2   Title  Independent with splint wear and care    Time  4    Period  Weeks    Status  New      OT SHORT TERM GOAL #3   Title  Pt/family to verbalize understanding with A/E recommendations to increase ease, independence and safety with ADLS/IADLS (rocker knife, shoe buttons, one handed cutting board, pot stabalizer)    Time  4    Period  Weeks    Status  New        OT Long Term Goals - 05/29/18 1213      OT LONG TERM GOAL #1   Title  Independent with updated HEP - 07/29/18    Time  8    Period  Weeks    Status  New      OT LONG TERM GOAL #2   Title  Pt to perform low to mid level reaching w/ sufficient finger extension for placement on cylindrical objects for grasp/release    Time  8    Period  Weeks    Status  New  OT LONG TERM GOAL #3   Title  Pt to improved RUE function as evidenced by performing 22 blocks or greater on Box & Blocks test    Baseline  eval = 17    Time  8    Period  Weeks    Status  New      OT LONG TERM GOAL #4   Title  Pt will consistently use RUE as stabalizer to min assist for low bilateral tasks    Time  8    Period  Weeks    Status  New            Plan - 06/12/18 1307    Clinical Impression Statement  Pt with improved RUE function during low range activities    Occupational Profile and client history currently impacting functional performance  CVA 09/15/16, HTN, gout    Occupational performance deficits (Please refer to evaluation for details):  ADL's;IADL's;Social Participation    Rehab Potential  Fair    Current Impairments/barriers affecting progress:  time since onset    OT Frequency  2x / week    OT Duration  8 weeks    OT Treatment/Interventions  Self-care/ADL training;Moist Heat;DME and/or AE instruction;Splinting;Aquatic Therapy;Therapeutic activities;Cognitive remediation/compensation;Coping strategies training;Therapeutic exercise;Neuromuscular education;Functional Mobility  Training;Passive range of motion;Visual/perceptual remediation/compensation;Electrical Stimulation;Manual Therapy;Patient/family education    Plan  continue NMR RUE/trunk, begin A/E exploration    Consulted and Agree with Plan of Care  Patient       Patient will benefit from skilled therapeutic intervention in order to improve the following deficits and impairments:  Decreased coordination, Decreased range of motion, Impaired flexibility, Improper body mechanics, Improper spinal/pelvic alignment, Decreased safety awareness, Impaired tone, Decreased knowledge of use of DME, Decreased balance, Impaired UE functional use, Decreased cognition, Decreased mobility, Decreased strength, Impaired vision/preception  Visit Diagnosis: Hemiplegia and hemiparesis following cerebral infarction affecting right dominant side (HCC)  Unsteadiness on feet    Problem List Patient Active Problem List   Diagnosis Date Noted  . CKD (chronic kidney disease) 12/18/2016  . Benign essential HTN   . Idiopathic gout   . Diastolic dysfunction   . Cocaine abuse ([place])   . Right hemiparesis ([place])   . 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 Cerebrovascular accident (CVA) ([place])   . Acute ischemic stroke ([place]) 09/15/2016  . Stroke (cerebrum) ([place]) 09/15/2016  . Hypertension 09/15/2016  . Vitamin B12 deficiency 08/10/2015  . Weakness 07/02/2013  . Abnormal CPK 07/02/2013    Carey Bullocks, OTR/L 06/12/2018, 1:08 PM  [facility] [address] [place], [place], [address] Phone: [phone]   Fax:  [phone]  Name: Randall Mccall MRN: [id number] Date of Birth: Dec 19, 1970

## 2018-06-15 NOTE — Therapy (Signed)
[facility] [address] [place], [place], [address] Phone: [phone]   Fax:  [phone]  Physical Therapy Treatment  Patient Details  Name: Randall Mccall MRN: [id number] Date of Birth: [birth year] Referring Provider (PT): Benito Mccreedy, MD   Encounter Date: 06/12/2018   06/12/18 1104  PT Visits / Re-Eval  Visit Number 3  Number of Visits 17  Date for PT Re-Evaluation 08/27/18 (POC written for 60 days, cert written for 90 days)  Authorization  Authorization Type UHC Medicare and Medicaid  PT Time Calculation  PT Start Time 1102  PT Stop Time 1144  PT Time Calculation (min) 42 min  PT - End of Session  Equipment Utilized During Treatment Gait belt  Activity Tolerance Patient tolerated treatment well  Behavior During Therapy [facility] for tasks assessed/performed     Past Medical History:  Diagnosis Date  . Gout   . Hypertension   . Muscle weakness   . Stroke [facility])     Past Surgical History:  Procedure Laterality Date  . KNEE SURGERY      There were no vitals filed for this visit.   06/12/18 1103  Symptoms/Limitations  Subjective No new falls. Does report his right knee is sore with movement from moving furniture around yesterday.   Pertinent History Pt goes by "[name]"  Limitations House hold activities;Walking  How long can you stand comfortably? about 15 mins  How long can you walk comfortably? about 15 mins   Patient Stated Goals "Work on my right side and get more balanced."   Pain Assessment  Currently in Pain? No/denies  Pain Score 0       06/12/18 1125  Neuro Re-ed   Neuro Re-ed Details  for strengthening/NMR/coordination: in tall kneeling with bil hands on red pball- mini squats x 10 reps for 2 sets, then alternating knees fwd/bwd LE movements, followed by alternating LE lateral knee movements out/in x 10 reps each, min guard to min assist for balance with cues on posture/ex form;  with left foot on 4 inch box and arms across chest- sit<>stands x 10 reps with cues for full standing and slow, controlled sitting.    added the following to pt's HEP on medbridge. Min guard assist for balance with cues on posture and ex form/technique.     Tandem Walking with Counter Support - 3 reps - 1 sets - 1x daily - 5x weekly  Walking March - 3 reps - 1 sets - 1x daily - 5x weekly  Side Stepping with Counter Support - 3 reps - 1 sets - 1x daily - 5x weekly  Toe Walking with Counter Support - 3 reps - 1 sets - 1x daily - 5x weekly       06/12/18 1800  PT Education  Education Details additions to HEP to address balance  Person(s) Educated Patient  Methods Explanation;Demonstration;Verbal cues;Handout  Comprehension Verbalized understanding;Returned demonstration;Verbal cues required;Need further instruction     PT Short Term Goals - 05/29/18 1037      PT SHORT TERM GOAL #1   Title  Pt will initiate HEP in order to indicate improved functional mobility and decreased fall risk.  (Target Date: 06/28/18)    Time  4    Period  Weeks    Status  New    Target Date  06/28/18      PT SHORT TERM GOAL #2   Title  Pt will perform 5TSS in </=11 secs without UE support and good  B LE weight bearing in order to indicate decreased fall risk.     Time  4    Period  Weeks    Status  New      PT SHORT TERM GOAL #3   Title  Pt will improve DGI to >/=17/24 in order to indicate decreased fall risk.     Time  4    Period  Weeks    Status  New      PT SHORT TERM GOAL #4   Title  Will assess need for RLE AFO and proceed appropriately.     Time  4    Period  Weeks    Status  New      PT SHORT TERM GOAL #5   Title  Pt will ambulate 500' over unlevel outdoor paved surfaces w/ LRAD (brace as needed) at mod I level in order to return to leisure and community activity.     Time  4    Period  Weeks    Status  New      Additional Short Term Goals   Additional Short Term Goals  Yes         PT Long Term Goals - 05/29/18 1040      PT LONG TERM GOAL #1   Title  Pt will be independnet with final HEP in order to indicate improved functional mobility and decreased fall risk.  (Target Date: 07/28/18)    Time  8    Period  Weeks    Status  New    Target Date  07/28/18      PT LONG TERM GOAL #2   Title  Pt will improve DGI to >19/24 in order to indicate decreased fall risk.      Time  8    Period  Weeks    Status  New      PT LONG TERM GOAL #3   Title  Pt will increase gait speed to >/=2.62 ft/sec in order to indictate safe community ambulation and improved efficiency of gait.     Time  8    Period  Weeks    Status  New      PT LONG TERM GOAL #4   Title  Pt will ambulate 1000' over varying outdoor surfaces (both paved and grassy, ramp/incline/curb) w/ LRAD (brace as needed) at mod I level in order to indicate safe community negotiation.     Time  8    Period  Weeks    Status  New         06/12/18 1104  Plan  Clinical Impression Statement Today's skilled session initally focused on adding dynamic balance compontents to pt's HEP. Remainder of session continued to focus on core and LE strengthening with no issues reported. The pt is making progress toward goals and should benefit from continued PT to progress toward unmet goals.   Pt will benefit from skilled therapeutic intervention in order to improve on the following deficits Abnormal gait;Decreased activity tolerance;Decreased balance;Decreased coordination;Decreased endurance;Decreased knowledge of use of DME;Decreased mobility;Decreased range of motion;Decreased strength;Impaired perceived functional ability;Impaired flexibility;Postural dysfunction;Improper body mechanics;Impaired UE functional use;Impaired tone;Impaired sensation  Rehab Potential Good  Clinical Impairments Affecting Rehab Potential length of time since CVA  PT Frequency 2x / week  PT Duration 8 weeks  PT Treatment/Interventions ADLs/Self Care  Home Management;Electrical Stimulation;DME Instruction;Gait training;Stair training;Functional mobility training;Therapeutic activities;Therapeutic exercise;Aquatic Therapy;Balance training;Neuromuscular re-education;Cognitive remediation;Patient/family education;Orthotic Fit/Training;Passive range of motion;Energy conservation;Vestibular  PT Next Visit Plan continue with  Bioness for gait/balance/strengthening, NMR for right LE- tall kneeling, half kneeling activities with emphasis on glut/hamstring activation, ? trial of clinic braces   Consulted and Agree with Plan of Care Patient    Patient will benefit from skilled therapeutic intervention in order to improve the following deficits and impairments:  Abnormal gait, Decreased activity tolerance, Decreased balance, Decreased coordination, Decreased endurance, Decreased knowledge of use of DME, Decreased mobility, Decreased range of motion, Decreased strength, Impaired perceived functional ability, Impaired flexibility, Postural dysfunction, Improper body mechanics, Impaired UE functional use, Impaired tone, Impaired sensation  Visit Diagnosis: Hemiplegia and hemiparesis following cerebral infarction affecting right dominant side (HCC)  Unsteadiness on feet  Other abnormalities of gait and mobility  Muscle weakness (generalized)     Problem List Patient Active Problem List   Diagnosis Date Noted  . CKD (chronic kidney disease) 12/18/2016  . Benign essential HTN   . Idiopathic gout   . Diastolic dysfunction   . Cocaine abuse ([place])   . Right hemiparesis ([place])   . Monoplegia of upper extremity following cerebral infarction affecting right dominant side ([place])   . Cerebrovascular accident (CVA) ([place])   . Acute ischemic stroke ([place]) 09/15/2016  . Stroke (cerebrum) ([place]) 09/15/2016  . Hypertension 09/15/2016  . Vitamin B12 deficiency 08/10/2015  . Weakness 07/02/2013  . Abnormal CPK 07/02/2013    Willow Ora, PTA, [facility] Outpatient Neuro  [facility] [address] [place], [place] [address] [phone] 06/15/18, 1:10 PM   Name: Randall Mccall MRN: [id number] Date of Birth: Nov 05, 1970

## 2018-06-16 ENCOUNTER — Encounter: Payer: Self-pay | Admitting: Physical Therapy

## 2018-06-16 ENCOUNTER — Ambulatory Visit: Payer: Medicare Other | Admitting: Occupational Therapy

## 2018-06-16 ENCOUNTER — Ambulatory Visit: Payer: Medicare Other | Admitting: Physical Therapy

## 2018-06-16 DIAGNOSIS — R2681 Unsteadiness on feet: Secondary | ICD-10-CM

## 2018-06-16 DIAGNOSIS — R2689 Other abnormalities of gait and mobility: Secondary | ICD-10-CM | POA: Diagnosis not present

## 2018-06-16 DIAGNOSIS — I69318 Other symptoms and signs involving cognitive functions following cerebral infarction: Secondary | ICD-10-CM | POA: Diagnosis not present

## 2018-06-16 DIAGNOSIS — R278 Other lack of coordination: Secondary | ICD-10-CM | POA: Diagnosis not present

## 2018-06-16 DIAGNOSIS — I69351 Hemiplegia and hemiparesis following cerebral infarction affecting right dominant side: Secondary | ICD-10-CM

## 2018-06-16 DIAGNOSIS — M6281 Muscle weakness (generalized): Secondary | ICD-10-CM | POA: Diagnosis not present

## 2018-06-16 NOTE — Therapy (Signed)
[facility] [address] [place], [place], [address] Phone: [phone]   Fax:  [phone]  Physical Therapy Treatment  Patient Details  Name: Randall Mccall MRN: [id number] Date of Birth: 1971/04/29 Referring Provider (PT): Benito Mccreedy, MD   Encounter Date: 06/16/2018  PT End of Session - 06/16/18 1105    Visit Number  4    Number of Visits  17    Date for PT Re-Evaluation  08/27/18   POC written for 60 days, cert written for 90 days   Authorization Type  UHC Medicare and Medicaid    PT Start Time  1103    PT Stop Time  1145    PT Time Calculation (min)  42 min    Equipment Utilized During Treatment  Gait belt    Activity Tolerance  Patient tolerated treatment well    Behavior During Therapy  [facility] for tasks assessed/performed       Past Medical History:  Diagnosis Date  . Gout   . Hypertension   . Muscle weakness   . Stroke [facility])     Past Surgical History:  Procedure Laterality Date  . KNEE SURGERY      There were no vitals filed for this visit.  Subjective Assessment - 06/16/18 1102    Subjective  No new falls. Having right sided pain from moving furniture into his house (converted garage behind his mom's house). Does report his right knee also want to "give out" when he's been busy/active.     Pertinent History  Pt goes by "[name]"    Limitations  House hold activities;Walking    How long can you stand comfortably?  about 15 mins    How long can you walk comfortably?  about 15 mins     Patient Stated Goals  "Work on my right side and get more balanced."     Currently in Pain?  Yes    Pain Score  7     Pain Location  Generalized    Pain Orientation  Right    Pain Descriptors / Indicators  Sore    Pain Onset  In the past 7 days    Pain Frequency  Intermittent    Aggravating Factors   increase activity    Pain Relieving Factors  rest           [facility] Adult PT Treatment/Exercise -  06/16/18 1106      Transfers   Transfers  Sit to Stand;Stand to Sit    Sit to Stand  5: Supervision;4: Min guard    Stand to Sit  5: Supervision;4: Min guard    Number of Reps  1 set;10 reps    Comments  feet across blue foam beam: sit<>stands x 10 reps with emphasis on tall posture and slow, controlled descent with sitting down. min assist needed for balance at times.       Ambulation/Gait   Ambulation/Gait  Yes    Ambulation/Gait Assistance  5: Supervision;4: Min guard    Ambulation/Gait Assistance Details  min guard when right LE fatigued after activities    Ambulation Distance (Feet)  --   around gym with activity   Assistive device  None    Gait Pattern  Decreased arm swing - right;Decreased stride length;Decreased stance time - right;Decreased step length - left;Decreased dorsiflexion - right;Decreased weight shift to right;Right circumduction;Right hip hike;Trunk flexed;Poor foot clearance - right    Ambulation Surface  Level;Indoor  Neuro Re-ed    Neuro Re-ed Details   for strengthening/coordination/NMR: fwd/bwd reciprocal stepping airex<>4 inch box<>march leg coming off airex<>step back to airex<>floor, alternating lead leg x ~10 reps each side, light UE support on bars, cues on ex form and technique; standing at bottom of stairs: right foot/toe taps up/down bottom 3 for 3 reps, then bottom 2 for remaing 7 reps due to fatigue and removed 3rd step. left UE support on rail with cues for incr hip/knee flexion for taps and to not "drag" foot along. min guard assist for balance.                                 Balance Exercises - 06/16/18 1113      Balance Exercises: Standing   Standing Eyes Closed  Narrow base of support (BOS);Head turns;Foam/compliant surface;Other reps (comment);30 secs;Limitations    Rockerboard  Anterior/posterior;Lateral;EO;EC;30 seconds    Balance Beam  standing across blue foam beam: alternating fwd heel taps to floor/back onto beam with min guard to  min assist, no UE support, cues for increased step length/height to clear beam surfaces.                          Balance Exercises: Standing   Standing Eyes Closed Limitations  standing across blue foam beam with no UE support: EC no head movements, progressing to EC head movements left<>right, up<>down, and diagonals both ways. min assist for balance with cues on weight shifting to assist with balance recovery.                  Rebounder Limitations  performed both ways on balance board in corner with no UE support/chair in front for safety: rocking the board with emphasis on tall posture with EC; holding board steady with EC no head movements. min to mod assist for balance, cues on posture and weight shifting to assist with balance recovery.                             PT Short Term Goals - 05/29/18 1037      PT SHORT TERM GOAL #1   Title  Pt will initiate HEP in order to indicate improved functional mobility and decreased fall risk.  (Target Date: 06/28/18)    Time  4    Period  Weeks    Status  New    Target Date  06/28/18      PT SHORT TERM GOAL #2   Title  Pt will perform 5TSS in </=11 secs without UE support and good B LE weight bearing in order to indicate decreased fall risk.     Time  4    Period  Weeks    Status  New      PT SHORT TERM GOAL #3   Title  Pt will improve DGI to >/=17/24 in order to indicate decreased fall risk.     Time  4    Period  Weeks    Status  New      PT SHORT TERM GOAL #4   Title  Will assess need for RLE AFO and proceed appropriately.     Time  4    Period  Weeks    Status  New      PT SHORT TERM GOAL #5   Title  Pt will ambulate  500' over unlevel outdoor paved surfaces w/ LRAD (brace as needed) at mod I level in order to return to leisure and community activity.     Time  4    Period  Weeks    Status  New      Additional Short Term Goals   Additional Short Term Goals  Yes        PT Long Term Goals - 05/29/18 1040      PT LONG  TERM GOAL #1   Title  Pt will be independnet with final HEP in order to indicate improved functional mobility and decreased fall risk.  (Target Date: 07/28/18)    Time  8    Period  Weeks    Status  New    Target Date  07/28/18      PT LONG TERM GOAL #2   Title  Pt will improve DGI to >19/24 in order to indicate decreased fall risk.      Time  8    Period  Weeks    Status  New      PT LONG TERM GOAL #3   Title  Pt will increase gait speed to >/=2.62 ft/sec in order to indictate safe community ambulation and improved efficiency of gait.     Time  8    Period  Weeks    Status  New      PT LONG TERM GOAL #4   Title  Pt will ambulate 1000' over varying outdoor surfaces (both paved and grassy, ramp/incline/curb) w/ LRAD (brace as needed) at mod I level in order to indicate safe community negotiation.     Time  8    Period  Weeks    Status  New            Plan - 06/16/18 1106    Clinical Impression Statement  Today's skilled session focused on LE strengthening and balance reactions with only fatigue reported which was relieved with rest breaks. Unable to trial braces for right LE today due to pt wearing boots. He is to wear sneakers at the next session for trail of AFO's. The pt is progressing toward goals and should benefit from continued PT to progress toward unmet goals .    Rehab Potential  Good    Clinical Impairments Affecting Rehab Potential  length of time since CVA    PT Frequency  2x / week    PT Duration  8 weeks    PT Treatment/Interventions  ADLs/Self Care Home Management;Electrical Stimulation;DME Instruction;Gait training;Stair training;Functional mobility training;Therapeutic activities;Therapeutic exercise;Aquatic Therapy;Balance training;Neuromuscular re-education;Cognitive remediation;Patient/family education;Orthotic Fit/Training;Passive range of motion;Energy conservation;Vestibular    PT Next Visit Plan  continue with Bioness for gait/balance/strengthening, NMR  for right LE- tall kneeling, half kneeling activities with emphasis on glut/hamstring activation, ? trial of clinic braces     Consulted and Agree with Plan of Care  Patient       Patient will benefit from skilled therapeutic intervention in order to improve the following deficits and impairments:  Abnormal gait, Decreased activity tolerance, Decreased balance, Decreased coordination, Decreased endurance, Decreased knowledge of use of DME, Decreased mobility, Decreased range of motion, Decreased strength, Impaired perceived functional ability, Impaired flexibility, Postural dysfunction, Improper body mechanics, Impaired UE functional use, Impaired tone, Impaired sensation  Visit Diagnosis: Hemiplegia and hemiparesis following cerebral infarction affecting right dominant side (HCC)  Unsteadiness on feet  Other abnormalities of gait and mobility  Muscle weakness (generalized)     Problem List Patient Active  Problem List   Diagnosis Date Noted  . CKD (chronic kidney disease) 12/18/2016  . Benign essential HTN   . Idiopathic gout   . Diastolic dysfunction   . Cocaine abuse ([place])   . Right hemiparesis ([place])   . Monoplegia of upper extremity following cerebral infarction affecting right dominant side ([place])   . Cerebrovascular accident (CVA) ([place])   . Acute ischemic stroke ([place]) 09/15/2016  . Stroke (cerebrum) ([place]) 09/15/2016  . Hypertension 09/15/2016  . Vitamin B12 deficiency 08/10/2015  . Weakness 07/02/2013  . Abnormal CPK 07/02/2013    Willow Ora, PTA, [facility] Outpatient Neuro [facility] [address] [place], [place] [address] [phone] 06/16/18, 9:59 PM   Name: Randall Mccall MRN: [id number] Date of Birth: 03-04-1971

## 2018-06-16 NOTE — Therapy (Signed)
[facility] [address] [place], [place], [address] Phone: [phone]   Fax:  [phone]  Occupational Therapy Treatment  Patient Details  Name: Randall Mccall MRN: [id number] Date of Birth: Jul 11, 1971 Referring Provider (OT): Dr. Benito Mccreedy   Encounter Date: 06/16/2018  OT End of Session - 06/16/18 1341    Visit Number  5    Number of Visits  17    Date for OT Re-Evaluation  07/29/18    Authorization Type  UHC MCR primary, MCD secondary    OT Start Time  1020    OT Stop Time  1103    OT Time Calculation (min)  43 min    Activity Tolerance  Patient tolerated treatment well    Behavior During Therapy  [facility] for tasks assessed/performed       Past Medical History:  Diagnosis Date  . Gout   . Hypertension   . Muscle weakness   . Stroke [facility])     Past Surgical History:  Procedure Laterality Date  . KNEE SURGERY      There were no vitals filed for this visit.  Subjective Assessment - 06/16/18 1049    Subjective   My thumb is moving better now    Pertinent History  CVA Jan 2018. PMH: HTN, gout    Patient Stated Goals  Get my Rt hand better so I can drive    Currently in Pain?  No/denies                   OT Treatments/Exercises (OP) - 06/16/18 0001      ADLs   ADL Comments  Pt issued handouts on A/E recommendations and told how/where to purchase if interested. Pt practiced use of rocker knife and therapist demo use of shoe buttons.  Assessed STG's and progress to date.       Neurological Re-education Exercises   Other Exercises 1  low level open chain reaching to pick up, move, and release small cylindrical objects w/ only occasional assist to stabalize cone when releasing.                OT Short Term Goals - 06/16/18 1341      OT SHORT TERM GOAL #1   Title  Independent with initial HEP - 06/29/18    Time  4    Period  Weeks    Status  Achieved      OT SHORT TERM  GOAL #2   Title  Independent with splint wear and care    Time  4    Period  Weeks    Status  Deferred   therapist feels splint may take away function secondary to use of tenodesis, therefore deferred this goal     OT [place] #3   Title  Pt/family to verbalize understanding with A/E recommendations to increase ease, independence and safety with ADLS/IADLS (rocker knife, shoe buttons, one handed cutting board, pot stabalizer)    Time  4    Period  Weeks    Status  Achieved        OT Long Term Goals - 05/29/18 1213      OT LONG TERM GOAL #1   Title  Independent with updated HEP - 07/29/18    Time  8    Period  Weeks    Status  New      OT LONG TERM GOAL #2   Title  Pt to perform low  to mid level reaching w/ sufficient finger extension for placement on cylindrical objects for grasp/release    Time  8    Period  Weeks    Status  New      OT LONG TERM GOAL #3   Title  Pt to improved RUE function as evidenced by performing 22 blocks or greater on Box & Blocks test    Baseline  eval = 17    Time  8    Period  Weeks    Status  New      OT LONG TERM GOAL #4   Title  Pt will consistently use RUE as stabalizer to min assist for low bilateral tasks    Time  8    Period  Weeks    Status  New            Plan - 06/16/18 1342    Clinical Impression Statement  Pt met 2 STG's. Pt with improved use of RUE for low range open chain activities    Occupational Profile and client history currently impacting functional performance  CVA 09/15/16, HTN, gout    Occupational performance deficits (Please refer to evaluation for details):  ADL's;IADL's;Social Participation    Rehab Potential  Fair    Current Impairments/barriers affecting progress:  time since onset    OT Frequency  2x / week    OT Duration  8 weeks    OT Treatment/Interventions  Self-care/ADL training;Moist Heat;DME and/or AE instruction;Splinting;Aquatic Therapy;Therapeutic activities;Cognitive  remediation/compensation;Coping strategies training;Therapeutic exercise;Neuromuscular education;Functional Mobility Training;Passive range of motion;Visual/perceptual remediation/compensation;Electrical Stimulation;Manual Therapy;Patient/family education    Plan  continue NMR RUE/trunk    Consulted and Agree with Plan of Care  Patient       Patient will benefit from skilled therapeutic intervention in order to improve the following deficits and impairments:  Decreased coordination, Decreased range of motion, Impaired flexibility, Improper body mechanics, Improper spinal/pelvic alignment, Decreased safety awareness, Impaired tone, Decreased knowledge of use of DME, Decreased balance, Impaired UE functional use, Decreased cognition, Decreased mobility, Decreased strength, Impaired vision/preception  Visit Diagnosis: Hemiplegia and hemiparesis following cerebral infarction affecting right dominant side [facility])    Problem List Patient Active Problem List   Diagnosis Date Noted  . CKD (chronic kidney disease) 12/18/2016  . Benign essential HTN   . Idiopathic gout   . Diastolic dysfunction   . Cocaine abuse ([place])   . Right hemiparesis ([place])   . Monoplegia of upper extremity following cerebral infarction affecting right dominant side ([place])   . Cerebrovascular accident (CVA) ([place])   . Acute ischemic stroke ([place]) 09/15/2016  . Stroke (cerebrum) ([place]) 09/15/2016  . Hypertension 09/15/2016  . Vitamin B12 deficiency 08/10/2015  . Weakness 07/02/2013  . Abnormal CPK 07/02/2013    Carey Bullocks, OTR/L 06/16/2018, 1:43 PM  [facility] [address] [place], [place], [address] Phone: [phone]   Fax:  [phone]  Name: Randall Mccall MRN: [id number] Date of Birth: 04-22-1971

## 2018-06-18 ENCOUNTER — Encounter: Payer: Self-pay | Admitting: Physical Therapy

## 2018-06-18 ENCOUNTER — Ambulatory Visit: Payer: Medicare Other | Admitting: Occupational Therapy

## 2018-06-18 ENCOUNTER — Ambulatory Visit: Payer: Medicare Other | Admitting: Physical Therapy

## 2018-06-18 DIAGNOSIS — R2689 Other abnormalities of gait and mobility: Secondary | ICD-10-CM

## 2018-06-18 DIAGNOSIS — R2681 Unsteadiness on feet: Secondary | ICD-10-CM

## 2018-06-18 DIAGNOSIS — M6281 Muscle weakness (generalized): Secondary | ICD-10-CM | POA: Diagnosis not present

## 2018-06-18 DIAGNOSIS — I69351 Hemiplegia and hemiparesis following cerebral infarction affecting right dominant side: Secondary | ICD-10-CM

## 2018-06-18 DIAGNOSIS — I69318 Other symptoms and signs involving cognitive functions following cerebral infarction: Secondary | ICD-10-CM | POA: Diagnosis not present

## 2018-06-18 DIAGNOSIS — R278 Other lack of coordination: Secondary | ICD-10-CM | POA: Diagnosis not present

## 2018-06-18 NOTE — Therapy (Signed)
[facility] [address] [place], [place], [address] Phone: [phone]   Fax:  [phone]  Physical Therapy Treatment  Patient Details  Name: Randall Mccall MRN: [id number] Date of Birth: March 21, 1971 Referring Provider (PT): Benito Mccreedy, MD   Encounter Date: 06/18/2018  PT End of Session - 06/18/18 1203    Visit Number  5    Number of Visits  17    Date for PT Re-Evaluation  08/27/18    Authorization Type  UHC Medicare and Medicaid    PT Start Time  0737    PT Stop Time  1228    PT Time Calculation (min)  43 min    Equipment Utilized During Treatment  Gait belt    Activity Tolerance  Patient tolerated treatment well    Behavior During Therapy  WFL for tasks assessed/performed       Past Medical History:  Diagnosis Date  . Gout   . Hypertension   . Muscle weakness   . Stroke [facility])     Past Surgical History:  Procedure Laterality Date  . KNEE SURGERY      There were no vitals filed for this visit.  Subjective Assessment - 06/18/18 1146    Subjective  doing well - fell last night - hand slipped - denies injury    Pertinent History  Pt goes by "Randall Mccall"    Patient Stated Goals  "Work on my right side and get more balanced."     Currently in Pain?  No/denies    Pain Score  0-No pain                       [facility] Adult PT Treatment/Exercise - 06/18/18 0001      Neuro Re-ed    Neuro Re-ed Details   in quadruped on fist (rather than open palm) with L shoulder taps by R UE for NMR x 12 reps - min guard for safety/stability; heel sit to tall kneel with k-bench x 12      Exercises   Exercises  Knee/Hip      Knee/Hip Exercises: Aerobic   Nustep  L4 x 5 min - B UE/LE       Knee/Hip Exercises: Supine   Bridges  Strengthening;Both;2 sets;10 reps    Bridges Limitations  2nd set with alternating march - difficulty following direction          Balance Exercises - 06/18/18 1235      Balance Exercises: Standing   Standing Eyes Opened  Narrow base of support (BOS);Solid surface;2 reps;20 secs;Head turns   horizontal and vertical   Standing Eyes Closed  Narrow base of support (BOS);Solid surface;3 reps;20 secs    Sit to Stand Time  sit to stand from mat table x 10; sit to stand from mat table with AirEx under feet x 10 - no UE suppor throughout    Other Standing Exercises  alternating to taps to 6" step - cueing to reduce circumduction          PT Short Term Goals - 05/29/18 1037      PT SHORT TERM GOAL #1   Title  Pt will initiate HEP in order to indicate improved functional mobility and decreased fall risk.  (Target Date: 06/28/18)    Time  4    Period  Weeks    Status  New    Target Date  06/28/18      PT SHORT TERM  GOAL #2   Title  Pt will perform 5TSS in </=11 secs without UE support and good B LE weight bearing in order to indicate decreased fall risk.     Time  4    Period  Weeks    Status  New      PT SHORT TERM GOAL #3   Title  Pt will improve DGI to >/=17/24 in order to indicate decreased fall risk.     Time  4    Period  Weeks    Status  New      PT SHORT TERM GOAL #4   Title  Will assess need for RLE AFO and proceed appropriately.     Time  4    Period  Weeks    Status  New      PT SHORT TERM GOAL #5   Title  Pt will ambulate 500' over unlevel outdoor paved surfaces w/ LRAD (brace as needed) at mod I level in order to return to leisure and community activity.     Time  4    Period  Weeks    Status  New      Additional Short Term Goals   Additional Short Term Goals  Yes        PT Long Term Goals - 05/29/18 1040      PT LONG TERM GOAL #1   Title  Pt will be independnet with final HEP in order to indicate improved functional mobility and decreased fall risk.  (Target Date: 07/28/18)    Time  8    Period  Weeks    Status  New    Target Date  07/28/18      PT LONG TERM GOAL #2   Title  Pt will improve DGI to >19/24 in order to  indicate decreased fall risk.      Time  8    Period  Weeks    Status  New      PT LONG TERM GOAL #3   Title  Pt will increase gait speed to >/=2.62 ft/sec in order to indictate safe community ambulation and improved efficiency of gait.     Time  8    Period  Weeks    Status  New      PT LONG TERM GOAL #4   Title  Pt will ambulate 1000' over varying outdoor surfaces (both paved and grassy, ramp/incline/curb) w/ LRAD (brace as needed) at mod I level in order to indicate safe community negotiation.     Time  8    Period  Weeks    Status  New            Plan - 06/18/18 1203    Clinical Impression Statement  Patient today reporting fall yesterday but does deny any injury. Session today focusing on hip extension/glute strengthing with balance and weight bearing in quadruped for NMR. Patient requires multiple seated rest breaks in session with patient visibly fatigued. Will continue to progress towards goals.     Rehab Potential  Good    Clinical Impairments Affecting Rehab Potential  length of time since CVA    PT Frequency  2x / week    PT Duration  8 weeks    PT Treatment/Interventions  ADLs/Self Care Home Management;Electrical Stimulation;DME Instruction;Gait training;Stair training;Functional mobility training;Therapeutic activities;Therapeutic exercise;Aquatic Therapy;Balance training;Neuromuscular re-education;Cognitive remediation;Patient/family education;Orthotic Fit/Training;Passive range of motion;Energy conservation;Vestibular    PT Next Visit Plan  continue with [place] for gait/balance/strengthening, NMR for right  LE- tall kneeling, half kneeling activities with emphasis on glut/hamstring activation, ? trial of clinic braces     Consulted and Agree with Plan of Care  Patient       Patient will benefit from skilled therapeutic intervention in order to improve the following deficits and impairments:  Abnormal gait, Decreased activity tolerance, Decreased balance, Decreased  coordination, Decreased endurance, Decreased knowledge of use of DME, Decreased mobility, Decreased range of motion, Decreased strength, Impaired perceived functional ability, Impaired flexibility, Postural dysfunction, Improper body mechanics, Impaired UE functional use, Impaired tone, Impaired sensation  Visit Diagnosis: Hemiplegia and hemiparesis following cerebral infarction affecting right dominant side (HCC)  Unsteadiness on feet  Muscle weakness (generalized)  Other abnormalities of gait and mobility     Problem List Patient Active Problem List   Diagnosis Date Noted  . CKD (chronic kidney disease) 12/18/2016  . Benign essential HTN   . Idiopathic gout   . Diastolic dysfunction   . Cocaine abuse ([place])   . Right hemiparesis ([place])   . Monoplegia of upper extremity following cerebral infarction affecting right dominant side ([place])   . Cerebrovascular accident (CVA) ([place])   . Acute ischemic stroke ([place]) 09/15/2016  . Stroke (cerebrum) ([place]) 09/15/2016  . Hypertension 09/15/2016  . Vitamin B12 deficiency 08/10/2015  . Weakness 07/02/2013  . Abnormal CPK 07/02/2013    Lanney Gins, PT, DPT Supplemental Physical Therapist 06/18/18 12:50 PM Pager: [phone] Office: [facility] [address] [place], [place], [address] Phone: [phone]   Fax:  [phone]  Name: Randall Mccall MRN: [id number] Date of Birth: [birth year]

## 2018-06-18 NOTE — Therapy (Signed)
[facility] [address] [place], [place], [address] Phone: [phone]   Fax:  [phone]  Occupational Therapy Treatment  Patient Details  Name: Randall Mccall MRN: [id number] Date of Birth: 01/20/1971 Referring Provider (OT): Dr. Benito Mccreedy   Encounter Date: 06/18/2018  OT End of Session - 06/18/18 1244    Visit Number  6    Number of Visits  17    Date for OT Re-Evaluation  07/29/18    Authorization Type  UHC MCR primary, MCD secondary    OT Start Time  1100    OT Stop Time  1145    OT Time Calculation (min)  45 min    Activity Tolerance  Patient tolerated treatment well    Behavior During Therapy  [facility] for tasks assessed/performed       Past Medical History:  Diagnosis Date  . Gout   . Hypertension   . Muscle weakness   . Stroke [facility])     Past Surgical History:  Procedure Laterality Date  . KNEE SURGERY      There were no vitals filed for this visit.  Subjective Assessment - 06/18/18 1103    Subjective   I fell yesterday doing an exercise I should not have done alone. But I'm ok    Pertinent History  CVA Jan 2018. PMH: HTN, gout    Patient Stated Goals  Get my Rt hand better so I can drive    Currently in Pain?  No/denies         Supine: chest press and sh flexion using PVC frame BUE's. Sidelying: wt bearing over Rt elbow/forearm for scapula depression/sh girdle stability while reaching LUE.  Seated: wt bearing over RUE performing various tasks including: forward reaching LUE, trunk rotation bilaterally, abduction w/ lateral reaching; followed by bilateral UE wt bearing for sit to squat positions.  Open chain reaching in flexion and abduction low to mid ranges for cylindrical objects. Progressed to independent reaching w/ focus on placement of hand/wrist when grasping and releasing. Pt has to compensate into wrist flexion to release object.                     OT Short  Term Goals - 06/16/18 1341      OT SHORT TERM GOAL #1   Title  Independent with initial HEP - 06/29/18    Time  4    Period  Weeks    Status  Achieved      OT SHORT TERM GOAL #2   Title  Independent with splint wear and care    Time  4    Period  Weeks    Status  Deferred   therapist feels splint may take away function secondary to use of tenodesis, therefore deferred this goal     OT [place] #3   Title  Pt/family to verbalize understanding with A/E recommendations to increase ease, independence and safety with ADLS/IADLS (rocker knife, shoe buttons, one handed cutting board, pot stabalizer)    Time  4    Period  Weeks    Status  Achieved        OT Long Term Goals - 05/29/18 1213      OT LONG TERM GOAL #1   Title  Independent with updated HEP - 07/29/18    Time  8    Period  Weeks    Status  New      OT LONG TERM  GOAL #2   Title  Pt to perform low to mid level reaching w/ sufficient finger extension for placement on cylindrical objects for grasp/release    Time  8    Period  Weeks    Status  New      OT LONG TERM GOAL #3   Title  Pt to improved RUE function as evidenced by performing 22 blocks or greater on Box & Blocks test    Baseline  eval = 17    Time  8    Period  Weeks    Status  New      OT LONG TERM GOAL #4   Title  Pt will consistently use RUE as stabalizer to min assist for low bilateral tasks    Time  8    Period  Weeks    Status  New            Plan - 06/18/18 1245    Clinical Impression Statement  Pt progressing with RUE movement in low to mid ranges. Pt limited distally by spasticity    Occupational Profile and client history currently impacting functional performance  CVA 09/15/16, HTN, gout    Occupational performance deficits (Please refer to evaluation for details):  ADL's;IADL's;Social Participation    Rehab Potential  Fair    Current Impairments/barriers affecting progress:  time since onset    OT Frequency  2x / week    OT  Duration  8 weeks    OT Treatment/Interventions  Self-care/ADL training;Moist Heat;DME and/or AE instruction;Splinting;Aquatic Therapy;Therapeutic activities;Cognitive remediation/compensation;Coping strategies training;Therapeutic exercise;Neuromuscular education;Functional Mobility Training;Passive range of motion;Visual/perceptual remediation/compensation;Electrical Stimulation;Manual Therapy;Patient/family education    Plan  continue NMR RUE/trunk and distal control       Patient will benefit from skilled therapeutic intervention in order to improve the following deficits and impairments:  Decreased coordination, Decreased range of motion, Impaired flexibility, Improper body mechanics, Improper spinal/pelvic alignment, Decreased safety awareness, Impaired tone, Decreased knowledge of use of DME, Decreased balance, Impaired UE functional use, Decreased cognition, Decreased mobility, Decreased strength, Impaired vision/preception  Visit Diagnosis: Hemiplegia and hemiparesis following cerebral infarction affecting right dominant side (HCC)  Unsteadiness on feet  Muscle weakness (generalized)    Problem List Patient Active Problem List   Diagnosis Date Noted  . CKD (chronic kidney disease) 12/18/2016  . Benign essential HTN   . Idiopathic gout   . Diastolic dysfunction   . Cocaine abuse ([place])   . Right hemiparesis ([place])   . Monoplegia of upper extremity following cerebral infarction affecting right dominant side ([place])   . Cerebrovascular accident (CVA) ([place])   . Acute ischemic stroke ([place]) 09/15/2016  . Stroke (cerebrum) ([place]) 09/15/2016  . Hypertension 09/15/2016  . Vitamin B12 deficiency 08/10/2015  . Weakness 07/02/2013  . Abnormal CPK 07/02/2013    Carey Bullocks, OTR/L 06/18/2018, 12:46 PM  [facility] [address] [place], [place], [address] Phone: [phone]   Fax:  [phone]  Name: MALCOLM QUAST MRN: [id number] Date of Birth: Sep 29, 1970

## 2018-06-23 ENCOUNTER — Ambulatory Visit: Payer: Medicare Other | Admitting: Occupational Therapy

## 2018-06-23 ENCOUNTER — Ambulatory Visit: Payer: Medicare Other | Admitting: Rehabilitation

## 2018-06-23 ENCOUNTER — Encounter: Payer: Self-pay | Admitting: Rehabilitation

## 2018-06-23 DIAGNOSIS — R278 Other lack of coordination: Secondary | ICD-10-CM | POA: Diagnosis not present

## 2018-06-23 DIAGNOSIS — I69351 Hemiplegia and hemiparesis following cerebral infarction affecting right dominant side: Secondary | ICD-10-CM | POA: Diagnosis not present

## 2018-06-23 DIAGNOSIS — R2689 Other abnormalities of gait and mobility: Secondary | ICD-10-CM

## 2018-06-23 DIAGNOSIS — M6281 Muscle weakness (generalized): Secondary | ICD-10-CM

## 2018-06-23 DIAGNOSIS — R2681 Unsteadiness on feet: Secondary | ICD-10-CM | POA: Diagnosis not present

## 2018-06-23 DIAGNOSIS — I69318 Other symptoms and signs involving cognitive functions following cerebral infarction: Secondary | ICD-10-CM | POA: Diagnosis not present

## 2018-06-23 NOTE — Therapy (Signed)
[facility] [address] [place], [place], [address] Phone: [phone]   Fax:  [phone]  Occupational Therapy Treatment  Patient Details  Name: Randall Mccall MRN: [id number] Date of Birth: 1971-06-11 Referring Provider (OT): Dr. Benito Mccreedy   Encounter Date: 06/23/2018  OT End of Session - 06/23/18 1422    Visit Number  7    Number of Visits  17    Date for OT Re-Evaluation  07/29/18    Authorization Type  U[employer] MCR primary, MCD secondary    OT Start Time  1230    OT Stop Time  1315    OT Time Calculation (min)  45 min    Activity Tolerance  Patient tolerated treatment well    Behavior During Therapy  [facility] for tasks assessed/performed       Past Medical History:  Diagnosis Date  . Gout   . Hypertension   . Muscle weakness   . Stroke [facility])     Past Surgical History:  Procedure Laterality Date  . KNEE SURGERY      There were no vitals filed for this visit.  Subjective Assessment - 06/23/18 1235    Pertinent History  CVA Jan 2018. PMH: HTN, gout    Patient Stated Goals  Get my Rt hand better so I can drive    Currently in Pain?  No/denies                   OT Treatments/Exercises (OP) - 06/23/18 0001      ADLs   ADL Comments  Discussed option of aquatic therapy as part of treatment and neuro re-education. Will explore further if pt has caregiver that can go with him      Neurological Re-education Exercises   Other Exercises 1  Supine: chest press and sh flexion using PVC frame. Progressed to chest press motion in standing w/ frame first along table, then without table.     Other Exercises 2  Holding ball BUE's (Rt fingers flexed) in standing to pick up ball from floor and lift to low diagnonal patterns for functional UE movement as well as wt shifts to Rt side and trunk rotation    Other Weight-Bearing Exercises 1  Modified quadraped over K-bench on elbows for A/P wt shifts, then  progressed to disengaging LUE to place wt over Rt elbow/forearm for scapula stability and downward depression w/ forward and upward ipsilateral reaching LUE.     Other Weight-Bearing Exercises 2  Tall kneeling w/ BUE's on ball: sh flexion on ball w/ short kneeling, back to tall kneeling while wt bearing through ball to bring back to neutral position               OT Short Term Goals - 06/16/18 1341      OT SHORT TERM GOAL #1   Title  Independent with initial HEP - 06/29/18    Time  4    Period  Weeks    Status  Achieved      OT SHORT TERM GOAL #2   Title  Independent with splint wear and care    Time  4    Period  Weeks    Status  Deferred   therapist feels splint may take away function secondary to use of tenodesis, therefore deferred this goal     OT [place] #3   Title  Pt/family to verbalize understanding with A/E recommendations to increase ease, independence and safety  with ADLS/IADLS (rocker knife, shoe buttons, one handed cutting board, pot stabalizer)    Time  4    Period  Weeks    Status  Achieved        OT Long Term Goals - 05/29/18 1213      OT LONG TERM GOAL #1   Title  Independent with updated HEP - 07/29/18    Time  8    Period  Weeks    Status  New      OT LONG TERM GOAL #2   Title  Pt to perform low to mid level reaching w/ sufficient finger extension for placement on cylindrical objects for grasp/release    Time  8    Period  Weeks    Status  New      OT LONG TERM GOAL #3   Title  Pt to improved RUE function as evidenced by performing 22 blocks or greater on Box & Blocks test    Baseline  eval = 17    Time  8    Period  Weeks    Status  New      OT LONG TERM GOAL #4   Title  Pt will consistently use RUE as stabalizer to min assist for low bilateral tasks    Time  8    Period  Weeks    Status  New            Plan - 06/23/18 1422    Clinical Impression Statement  Pt progressing with RUE movement in low to mid ranges. Pt  limited distally by spasticity. Pt also with weak pelvic control    Occupational Profile and client history currently impacting functional performance  CVA 09/15/16, HTN, gout    Occupational performance deficits (Please refer to evaluation for details):  ADL's;IADL's;Social Participation    Rehab Potential  Fair    Current Impairments/barriers affecting progress:  time since onset    OT Frequency  2x / week    OT Duration  8 weeks    OT Treatment/Interventions  Self-care/ADL training;Moist Heat;DME and/or AE instruction;Splinting;Aquatic Therapy;Therapeutic activities;Cognitive remediation/compensation;Coping strategies training;Therapeutic exercise;Neuromuscular education;Functional Mobility Training;Passive range of motion;Visual/perceptual remediation/compensation;Electrical Stimulation;Manual Therapy;Patient/family education    Plan  continue NMR RUE/trunk and distal control    Consulted and Agree with Plan of Care  Patient       Patient will benefit from skilled therapeutic intervention in order to improve the following deficits and impairments:  Decreased coordination, Decreased range of motion, Impaired flexibility, Improper body mechanics, Improper spinal/pelvic alignment, Decreased safety awareness, Impaired tone, Decreased knowledge of use of DME, Decreased balance, Impaired UE functional use, Decreased cognition, Decreased mobility, Decreased strength, Impaired vision/preception  Visit Diagnosis: Hemiplegia and hemiparesis following cerebral infarction affecting right dominant side (HCC)  Unsteadiness on feet  Muscle weakness (generalized)    Problem List Patient Active Problem List   Diagnosis Date Noted  . CKD (chronic kidney disease) 12/18/2016  . Benign essential HTN   . Idiopathic gout   . Diastolic dysfunction   . Cocaine abuse ([place])   . Right hemiparesis ([place])   . Monoplegia of upper extremity following cerebral infarction affecting right dominant side ([place])   .  Cerebrovascular accident (CVA) ([place])   . Acute ischemic stroke ([place]) 09/15/2016  . Stroke (cerebrum) ([place]) 09/15/2016  . Hypertension 09/15/2016  . Vitamin B12 deficiency 08/10/2015  . Weakness 07/02/2013  . Abnormal CPK 07/02/2013    Carey Bullocks, OTR/L 06/23/2018, 2:23 PM  [facility] [address] [place], [place], [address] Phone: [phone]   Fax:  [phone]  Name: MICKAL MENO MRN: [id number] Date of Birth: [birth year]

## 2018-06-23 NOTE — Therapy (Signed)
[facility] [address] [place], [place], [address] Phone: [phone]   Fax:  [phone]  Physical Therapy Treatment  Patient Details  Name: Randall Mccall MRN: [id number] Date of Birth: Jan 17, 1971 Referring Provider (PT): Benito Mccreedy, MD   Encounter Date: 06/23/2018  PT End of Session - 06/23/18 1319    Visit Number  6    Number of Visits  17    Date for PT Re-Evaluation  08/27/18    Authorization Type  UHC Medicare and Medicaid    PT Start Time  1315    PT Stop Time  1400    PT Time Calculation (min)  45 min    Equipment Utilized During Treatment  Gait belt    Activity Tolerance  Patient tolerated treatment well    Behavior During Therapy  [facility] for tasks assessed/performed       Past Medical History:  Diagnosis Date  . Gout   . Hypertension   . Muscle weakness   . Stroke [facility])     Past Surgical History:  Procedure Laterality Date  . KNEE SURGERY      There were no vitals filed for this visit.  Subjective Assessment - 06/23/18 1317    Subjective  Pt reports he is doing well.  No falls since last visit.     Pertinent History  Pt goes by "[name]"    Limitations  House hold activities;Walking    How long can you stand comfortably?  about 15 mins    How long can you walk comfortably?  about 15 mins     Patient Stated Goals  "Work on my right side and get more balanced."     Currently in Pain?  No/denies                       [facility] Adult PT Treatment/Exercise - 06/23/18 1315      Ambulation/Gait   Ambulation/Gait  Yes    Ambulation/Gait Assistance  5: Supervision;4: Min guard;4: Min assist    Ambulation/Gait Assistance Details  Trialed use of two AFOs on RLE to assist with R foot clearance and decrease fall risk.  Initially tried [place] walk on PLS AFO which provided adequate foot clearance but was difficult to get into shoe.  He was wearing high top shoes today which likley  impacted ability to don regardless, therefore he will plan to wear low top shoes at next visit to ensure most appropriate brace.  Then tried R toe off AFO which also provided good clearance and was slighlty easier to don, however pt did report this brace felt better to him.  Again, will continue to assess with different shoes.  During gait, note that he tends to keep R knee flexed and during R swing phase of gait tends to demonstrate R trunk shortening and and R forward/dropped shoulder.  Provided cues for improved R knee extension with R hip extension during stance phase of gait and provided tactile cues at R scapula/trunk to maintaining active upright trunk during all gait.      Ambulation Distance (Feet)  230 Feet   300' x 1, 100' x 1   Assistive device  None    Gait Pattern  Decreased arm swing - right;Decreased stride length;Decreased stance time - right;Decreased step length - left;Decreased dorsiflexion - right;Decreased weight shift to right;Right circumduction;Right hip hike;Trunk flexed;Poor foot clearance - right    Ambulation Surface  Level;Indoor    Pre-Gait  Activities  Due to noted gait abnormalities listed above, had pt stand at countertop with LUE support with R LE staggered in front of LLE performing forward/backward weight shift with emphasis on improving R knee extension and R hip extension without overt trunk flexion.  Pt was able to do very well with repetition during session and did demo mild improvement in carryover in session.       Exercises   Exercises  Knee/Hip    Other Exercises   Issued exercises for R LE strengthening and flexibility.  Performed supione BLE briding x 10 reps before progressing to RLE only bridging  x 10 reps (pt crossing L heel over R knee) to decrease LLE use.  R hip flex stretch with R LE off edge of mat x 2 mins.  Sit<>stand with LLE placed out in front of RLE x 10 reps.              PT Education - 06/23/18 1318    Education Details  additions to  HEP    Person(s) Educated  Patient    Methods  Explanation    Comprehension  Verbalized understanding       PT Short Term Goals - 05/29/18 1037      PT SHORT TERM GOAL #1   Title  Pt will initiate HEP in order to indicate improved functional mobility and decreased fall risk.  (Target Date: 06/28/18)    Time  4    Period  Weeks    Status  New    Target Date  06/28/18      PT SHORT TERM GOAL #2   Title  Pt will perform 5TSS in </=11 secs without UE support and good B LE weight bearing in order to indicate decreased fall risk.     Time  4    Period  Weeks    Status  New      PT SHORT TERM GOAL #3   Title  Pt will improve DGI to >/=17/24 in order to indicate decreased fall risk.     Time  4    Period  Weeks    Status  New      PT SHORT TERM GOAL #4   Title  Will assess need for RLE AFO and proceed appropriately.     Time  4    Period  Weeks    Status  New      PT SHORT TERM GOAL #5   Title  Pt will ambulate 500' over unlevel outdoor paved surfaces w/ LRAD (brace as needed) at mod I level in order to return to leisure and community activity.     Time  4    Period  Weeks    Status  New      Additional Short Term Goals   Additional Short Term Goals  Yes        PT Long Term Goals - 05/29/18 1040      PT LONG TERM GOAL #1   Title  Pt will be independnet with final HEP in order to indicate improved functional mobility and decreased fall risk.  (Target Date: 07/28/18)    Time  8    Period  Weeks    Status  New    Target Date  07/28/18      PT LONG TERM GOAL #2   Title  Pt will improve DGI to >19/24 in order to indicate decreased fall risk.      Time  8  Period  Weeks    Status  New      PT LONG TERM GOAL #3   Title  Pt will increase gait speed to >/=2.62 ft/sec in order to indictate safe community ambulation and improved efficiency of gait.     Time  8    Period  Weeks    Status  New      PT LONG TERM GOAL #4   Title  Pt will ambulate 1000' over varying  outdoor surfaces (both paved and grassy, ramp/incline/curb) w/ LRAD (brace as needed) at mod I level in order to indicate safe community negotiation.     Time  8    Period  Weeks    Status  New            Plan - 06/23/18 2000    Clinical Impression Statement  Skilled session focused on assessing gait with varying AFOs for RLE clearance.  Note that both PLS and toe off brace provided adequate foot clearance but pt reporting increased comfort with allard toe off.  Will continue to assess when pt brings in low top shoes.  Also added exercises to HEP.  Pt tolerated well.     Rehab Potential  Good    Clinical Impairments Affecting Rehab Potential  length of time since CVA    PT Frequency  2x / week    PT Duration  8 weeks    PT Treatment/Interventions  ADLs/Self Care Home Management;Electrical Stimulation;DME Instruction;Gait training;Stair training;Functional mobility training;Therapeutic activities;Therapeutic exercise;Aquatic Therapy;Balance training;Neuromuscular re-education;Cognitive remediation;Patient/family education;Orthotic Fit/Training;Passive range of motion;Energy conservation;Vestibular    PT Next Visit Plan  check STGs, As pt brings in low top shoes-try allard toe off AFO to ensure still appropriate, continue with [place] for gait/balance/strengthening, NMR for right LE- tall kneeling, half kneeling activities with emphasis on glut/hamstring activation, ? trial of clinic braces     PT Home Exercise Plan  HEP from [place]: [place] and Agree with Plan of Care  Patient       Patient will benefit from skilled therapeutic intervention in order to improve the following deficits and impairments:  Abnormal gait, Decreased activity tolerance, Decreased balance, Decreased coordination, Decreased endurance, Decreased knowledge of use of DME, Decreased mobility, Decreased range of motion, Decreased strength, Impaired perceived functional ability, Impaired flexibility, Postural  dysfunction, Improper body mechanics, Impaired UE functional use, Impaired tone, Impaired sensation  Visit Diagnosis: Hemiplegia and hemiparesis following cerebral infarction affecting right dominant side (HCC)  Unsteadiness on feet  Muscle weakness (generalized)  Other abnormalities of gait and mobility     Problem List Patient Active Problem List   Diagnosis Date Noted  . CKD (chronic kidney disease) 12/18/2016  . Benign essential HTN   . Idiopathic gout   . Diastolic dysfunction   . Cocaine abuse ([place])   . Right hemiparesis ([place])   . Monoplegia of upper extremity following cerebral infarction affecting right dominant side ([place])   . Cerebrovascular accident (CVA) ([place])   . Acute ischemic stroke ([place]) 09/15/2016  . Stroke (cerebrum) ([place]) 09/15/2016  . Hypertension 09/15/2016  . Vitamin B12 deficiency 08/10/2015  . Weakness 07/02/2013  . Abnormal CPK 07/02/2013    Cameron Sprang, PT, MPT [facility] [address] [place], [place], [address] Phone: [phone]   Fax:  [phone] 06/23/18, 8:04 PM  Name: Randall Mccall MRN: [id number] Date of Birth: 01-Jun-1971

## 2018-06-24 ENCOUNTER — Other Ambulatory Visit: Payer: Self-pay | Admitting: *Deleted

## 2018-06-24 NOTE — Patient Outreach (Signed)
[facility]) Care Management   06/24/2018  SANI MADARIAGA 1971/08/16 [id number]  MARLO GOODRICH is an 47 y.o. male  Subjective:  HTN: Pt reports recent BP readings within his normal reads with no residual symptoms. Pt able to recite some of the signs and symptoms along with what to do if acute symptoms are encountered. MEDICATION: Pt states he needs a refill on one of his medications and will contact his pharmacy with this request. Pt states he has his dose for today and get a refill today.   OUTPATIENT : Pt reports he has started his  PT/OT with outpatient therapy. Pt states she is making progress very well and will continue this therapy until December.  Pt reports he is getting feeling back in his legs with the ongoing therapy.   Pt denies any falls or incidents over the next last  However indicates he loss his license and can no longer drive due to his limited mobility when driving a car. Pt states he has attended all his medical appointments with no delays.  Objective:   Review of Systems  Constitutional: Negative.   HENT: Negative.   Eyes: Negative.   Respiratory: Negative.   Cardiovascular: Negative.   Gastrointestinal: Negative.   Genitourinary: Negative.   Musculoskeletal: Negative.   Skin: Negative.   Neurological: Negative.   Endo/Heme/Allergies: Negative.   Psychiatric/Behavioral: Negative.     Physical Exam  Constitutional: He is oriented to person, place, and time. He appears well-developed and well-nourished.  HENT:  Right Ear: External ear normal.  Left Ear: External ear normal.  Eyes: EOM are normal.  Neck: Normal range of motion.  Cardiovascular: Normal heart sounds.  Respiratory: Effort normal and breath sounds normal.  GI: Soft. Bowel sounds are normal.  Musculoskeletal:  Limited mobility to the right side due to history of CVA with residual effects  Neurological: He is alert and oriented to person, place, and time.  Skin: Skin is warm  and dry.  Psychiatric: He has a normal mood and affect. His behavior is normal. Judgment and thought content normal.    Encounter Medications:   Outpatient Encounter Medications as of 06/24/2018  Medication Sig  . allopurinol (ZYLOPRIM) 100 MG tablet Take 1 tablet (100 mg total) by mouth daily.  Marland Kitchen amLODipine (NORVASC) 10 MG tablet Take 1 tablet (10 mg total) by mouth daily.  Marland Kitchen aspirin 325 MG tablet Take 1 tablet (325 mg total) by mouth daily.  Marland Kitchen atorvastatin (LIPITOR) 80 MG tablet Take 1 tablet (80 mg total) by mouth daily at 6 PM.  . Cholecalciferol (VITAMIN D3) 5000 units TABS Take 1 tablet by mouth daily.  . cloNIDine (CATAPRES) 0.1 MG tablet Take 1 tablet (0.1 mg total) by mouth 2 (two) times daily.  . colchicine 0.6 MG tablet Take 1 tablet (0.6 mg total) by mouth every hour as needed. Up to 4 in one 24-hour period, if needed for pain  . gabapentin (NEURONTIN) 300 MG capsule Take 300 mg by mouth at bedtime.  Marland Kitchen HYDROcodone-acetaminophen (NORCO/VICODIN) 5-325 MG tablet Take 1 tablet by mouth every 4 (four) hours as needed.  Marland Kitchen oxyCODONE-acetaminophen (PERCOCET/ROXICET) 5-325 MG tablet Take 1 tablet by mouth every 8 (eight) hours as needed for severe pain.  . predniSONE (STERAPRED UNI-PAK 21 TAB) 10 MG (21) TBPK tablet Take by mouth daily. Take 6 tabs by mouth daily  for 1 days, then 5 tabs for 2 days, then 4 tabs for 2 days, then 3 tabs for 2 days,  2 tabs for 2 days, then 1 tab by mouth daily for 2 days (Patient not taking: Reported on 05/23/2018)  . senna-docusate (SENOKOT-S) 8.6-50 MG tablet Take 1 tablet by mouth at bedtime as needed for mild constipation.   No facility-administered encounter medications on file as of 06/24/2018.     Functional Status:   In your present state of health, do you have any difficulty performing the following activities: 03/03/2018  Hearing? N  Vision? N  Difficulty concentrating or making decisions? N  Walking or climbing stairs? Y  Dressing or bathing? Y   Doing errands, shopping? Y  Preparing Food and eating ? N  Using the Toilet? N  In the past six months, have you accidently leaked urine? N  Do you have problems with loss of bowel control? N  Managing your Medications? N  Managing your Finances? N  Housekeeping or managing your Housekeeping? Y  Comment staying with mother  Some recent data might be hidden  BP 126/86 (BP Location: Left Arm, Patient Position: Sitting, Cuff Size: Normal)   Pulse 86   Resp 20   Ht 1.854 m (6' 1")   Wt 260 lb (117.9 kg)   SpO2 98%   BMI 34.30 kg/m   Fall/Depression Screening:    Fall Risk  03/20/2018  Falls in the past year? No   PHQ 2/9 Scores 03/20/2018 03/03/2018  PHQ - 2 Score 0 1    Assessment:   Ongoing case management related to HTN Refills needed via mediation Will follow up on out patient therapy (PT/OT) Follow up on dietary habits related to sodium  Plan:  Will review and reiterate on the plan of care related to his HTN and update the interventions accordingly. Will verify stable vitals today and continue to encouraged adherence with his daily monitoring. Will continue to inquire on any signs or symptoms experienced as pt denies. Will offer to call pt's pharmacy for refill however pt indicated he would contact his pharmacy with this request today.  Will verify pt is currently active with participating in out patient therapy for PT/OT. Will continue to encouraged pt on his participation.  Will follow up and verify pt continue to consume a low sodium diet. Will continue to encourage adherence.  Will update pt's primary provider of pt's ongoing participation in the program with plans to graduate pt next month if he continue to do well on the current plan of care.  No additional questions or inquires at this time.  THN CM Care Plan Problem One     Most Recent Value  THN CM Short Term Goal #2   Pt will consume a healthier diet with low sodium food items to lower his risk over the next 30  days.  THN CM Short Term Goal #2 Start Date  03/20/18 [This goal has been met however ended missing END DATE ]  Interventions for Short Term Goal #2  GOAL MET    THN CM Care Plan Problem Two     Most Recent Value  Care Plan Problem Two  Limited ROM and mobility  Role Documenting the Problem Two  Care Management Coordinator  Care Plan for Problem Two  Active  THN CM Short Term Goal #1    Pt will be receptive to the out-patient therapy over the next 30 days  THN CM Short Term Goal #1 Start Date  05/23/18  Interventions for Short Term Goal #2   Verified pt has began this therapy 2-3 times week, y  with PT/OT. Will extend this goal and follow up next month on pt's process.        Raina Mina, RN Care Management Coordinator [place] Office [phone]

## 2018-06-26 ENCOUNTER — Encounter: Payer: Self-pay | Admitting: Physical Therapy

## 2018-06-26 ENCOUNTER — Ambulatory Visit: Payer: Medicare Other | Admitting: Occupational Therapy

## 2018-06-26 ENCOUNTER — Ambulatory Visit: Payer: Medicare Other | Admitting: Physical Therapy

## 2018-06-26 DIAGNOSIS — R2689 Other abnormalities of gait and mobility: Secondary | ICD-10-CM | POA: Diagnosis not present

## 2018-06-26 DIAGNOSIS — M6281 Muscle weakness (generalized): Secondary | ICD-10-CM

## 2018-06-26 DIAGNOSIS — R2681 Unsteadiness on feet: Secondary | ICD-10-CM

## 2018-06-26 DIAGNOSIS — I69351 Hemiplegia and hemiparesis following cerebral infarction affecting right dominant side: Secondary | ICD-10-CM

## 2018-06-26 DIAGNOSIS — R278 Other lack of coordination: Secondary | ICD-10-CM | POA: Diagnosis not present

## 2018-06-26 DIAGNOSIS — I69318 Other symptoms and signs involving cognitive functions following cerebral infarction: Secondary | ICD-10-CM | POA: Diagnosis not present

## 2018-06-26 NOTE — Therapy (Signed)
[facility] [address] [place], [place], [address] Phone: [phone]   Fax:  [phone]  Occupational Therapy Treatment  Patient Details  Name: Randall Mccall MRN: [id number] Date of Birth: [birth year] Referring Provider (OT): Dr. Benito Mccreedy   Encounter Date: 06/26/2018  OT End of Session - 06/26/18 1124    Visit Number  8    Number of Visits  17    Date for OT Re-Evaluation  07/29/18    Authorization Type  UHC MCR primary, MCD secondary    OT Start Time  1015    OT Stop Time  1100    OT Time Calculation (min)  45 min    Activity Tolerance  Patient tolerated treatment well    Behavior During Therapy  [facility] for tasks assessed/performed       Past Medical History:  Diagnosis Date  . Gout   . Hypertension   . Muscle weakness   . Stroke [facility])     Past Surgical History:  Procedure Laterality Date  . KNEE SURGERY      There were no vitals filed for this visit.  Subjective Assessment - 06/26/18 1024    Pertinent History  CVA Jan 2018. PMH: HTN, gout    Patient Stated Goals  Get my Rt hand better so I can drive    Currently in Pain?  No/denies                   OT Treatments/Exercises (OP) - 06/26/18 1120      Neurological Re-education Exercises   Other Exercises 1  Supine: chest press and sh flexion using PVC frame. Progressed to same motion seated against gravity w/ cues to prevent sh. hiking. Sidelying: wt bearing over Rt elbow while reaching LUE in abduction for Rt scapula depression and strengthening    Other Exercises 2  Standing: holding ball BUE's (Rt fingers flexed) moving ball from floor to mid range flexion, then to low range diagonal patterns. Seated: functional grasping/releasing to reach for cones in low flexion and abduction planes.     Other Weight-Bearing Exercises 2  Sit to squat position while wt bearing through UE's. Seated: leaning back onto Rt elbow with scapula  depression ex's               OT Short Term Goals - 06/16/18 1341      OT SHORT TERM GOAL #1   Title  Independent with initial HEP - 06/29/18    Time  4    Period  Weeks    Status  Achieved      OT SHORT TERM GOAL #2   Title  Independent with splint wear and care    Time  4    Period  Weeks    Status  Deferred   therapist feels splint may take away function secondary to use of tenodesis, therefore deferred this goal     OT [place] #3   Title  Pt/family to verbalize understanding with A/E recommendations to increase ease, independence and safety with ADLS/IADLS (rocker knife, shoe buttons, one handed cutting board, pot stabalizer)    Time  4    Period  Weeks    Status  Achieved        OT Long Term Goals - 06/26/18 1126      OT LONG TERM GOAL #1   Title  Independent with updated HEP - 07/29/18    Time  8  Period  Weeks    Status  New      OT LONG TERM GOAL #2   Title  Pt to perform low to mid level reaching w/ sufficient finger extension for placement on cylindrical objects for grasp/release    Time  8    Period  Weeks    Status  On-going      OT LONG TERM GOAL #3   Title  Pt to improved RUE function as evidenced by performing 22 blocks or greater on Box & Blocks test    Baseline  eval = 17    Time  8    Period  Weeks    Status  On-going      OT LONG TERM GOAL #4   Title  Pt will consistently use RUE as stabalizer to min assist for low bilateral tasks    Time  8    Period  Weeks    Status  On-going            Plan - 06/26/18 1124    Clinical Impression Statement  Pt continues to make steady progress towards goals. Pt limited only by distal spasticity    Occupational Profile and client history currently impacting functional performance  CVA 09/15/16, HTN, gout    Occupational performance deficits (Please refer to evaluation for details):  ADL's;IADL's;Social Participation    Rehab Potential  Fair    OT Frequency  2x / week    OT Duration   8 weeks    OT Treatment/Interventions  Self-care/ADL training;Moist Heat;DME and/or AE instruction;Splinting;Aquatic Therapy;Therapeutic activities;Cognitive remediation/compensation;Coping strategies training;Therapeutic exercise;Neuromuscular education;Functional Mobility Training;Passive range of motion;Visual/perceptual remediation/compensation;Electrical Stimulation;Manual Therapy;Patient/family education    Plan  continue NMR RUE/trunk and distal control    Consulted and Agree with Plan of Care  Patient       Patient will benefit from skilled therapeutic intervention in order to improve the following deficits and impairments:  Decreased coordination, Decreased range of motion, Impaired flexibility, Improper body mechanics, Improper spinal/pelvic alignment, Decreased safety awareness, Impaired tone, Decreased knowledge of use of DME, Decreased balance, Impaired UE functional use, Decreased cognition, Decreased mobility, Decreased strength, Impaired vision/preception  Visit Diagnosis: Hemiplegia and hemiparesis following cerebral infarction affecting right dominant side (HCC)  Unsteadiness on feet    Problem List Patient Active Problem List   Diagnosis Date Noted  . CKD (chronic kidney disease) 12/18/2016  . Benign essential HTN   . Idiopathic gout   . Diastolic dysfunction   . Cocaine abuse ([place])   . Right hemiparesis ([place])   . 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 Cerebrovascular accident (CVA) ([place])   . Acute ischemic stroke ([place]) 09/15/2016  . Stroke (cerebrum) ([place]) 09/15/2016  . Hypertension 09/15/2016  . Vitamin B12 deficiency 08/10/2015  . Weakness 07/02/2013  . Abnormal CPK 07/02/2013    Carey Bullocks, OTR/L 06/26/2018, 11:26 AM  [facility] [address] [place], [place], [address] Phone: [phone]   Fax:  [phone]  Name:  DELMAR ARRIAGA MRN: [id number] Date of Birth: Jan 16, 1971

## 2018-06-26 NOTE — Therapy (Signed)
[facility] [address] [place], [place], [address] Phone: [phone]   Fax:  [phone]  Physical Therapy Treatment  Patient Details  Name: Randall Mccall MRN: [id number] Date of Birth: Dec 25, 1970 Referring Provider (PT): Benito Mccreedy, MD   Encounter Date: 06/26/2018  PT End of Session - 06/26/18 0934    Visit Number  7    Number of Visits  17    Date for PT Re-Evaluation  08/27/18    Authorization Type  UHC Medicare and Medicaid    PT Start Time  0933    PT Stop Time  1013    PT Time Calculation (min)  40 min    Equipment Utilized During Treatment  Gait belt    Activity Tolerance  Patient tolerated treatment well    Behavior During Therapy  [facility] for tasks assessed/performed       Past Medical History:  Diagnosis Date  . Gout   . Hypertension   . Muscle weakness   . Stroke [facility])     Past Surgical History:  Procedure Laterality Date  . KNEE SURGERY      There were no vitals filed for this visit.  Subjective Assessment - 06/26/18 0934    Subjective  No new complaints. No falls or pain.     Pertinent History  Pt goes by "[name]"    Limitations  House hold activities;Walking    How long can you stand comfortably?  about 15 mins    How long can you walk comfortably?  about 15 mins     Patient Stated Goals  "Work on my right side and get more balanced."     Currently in Pain?  No/denies    Pain Score  0-No pain         [facility] PT Assessment - 06/26/18 0936      Transfers   Transfers  Sit to Stand;Stand to Sit    Sit to Stand  5: Supervision;With upper extremity assist;From bed    Five time sit to stand comments   12.31 sec's using standard chair without UE support    Stand to Sit  5: Supervision;With upper extremity assist;To bed      Ambulation/Gait   Ambulation/Gait  Yes    Ambulation/Gait Assistance  5: Supervision;4: Min guard    Ambulation Distance (Feet)  --   around gym with testing    Assistive device  None    Gait Pattern  Decreased arm swing - right;Decreased stride length;Decreased stance time - right;Decreased step length - left;Decreased dorsiflexion - right;Decreased weight shift to right;Right circumduction;Right hip hike;Trunk flexed;Poor foot clearance - right    Ambulation Surface  Level;Indoor    Gait velocity  15.10 sec's= 2.17 ft/sec      Dynamic Gait Index   Level Surface  Mild Impairment    Change in Gait Speed  Mild Impairment    Gait with Horizontal Head Turns  Mild Impairment    Gait with Vertical Head Turns  Normal    Gait and Pivot Turn  Mild Impairment    Step Over Obstacle  Mild Impairment    Step Around Obstacles  Normal    Steps  Mild Impairment    Total Score  18          [facility] Adult PT Treatment/Exercise - 06/26/18 0936      Neuro Re-ed    Neuro Re-ed Details   for strengthening/balance reactions: fwd/bwd reciprocal stepping floor<>airex<>march with non stance  leg for 8-10 reps each side with light support on parallel bars, cues needed for task, step length and posture; attempted to stand on solid base of BOSU, pt unable to gait stability for single leg stance activity therefore switched to single leg stance on green foam disc- performed fwd<>lateral kicks x 10 reps with each leg. light UE support on bars, min guard assist with cues on form/technique.          PT Short Term Goals - 06/26/18 0935      PT SHORT TERM GOAL #1   Title  Pt will initiate HEP in order to indicate improved functional mobility and decreased fall risk.  (Target Date: 06/28/18)    Baseline  06/26/18: met with current HEP    Time  --    Period  --    Status  Achieved      PT SHORT TERM GOAL #2   Title  Pt will perform 5TSS in </=11 secs without UE support and good B LE weight bearing in order to indicate decreased fall risk.     Baseline  06/26/18: 12.31 sec's without UE support, standard height chair, improved just not to goal    Time  --    Period  --     Status  Partially Met      PT SHORT TERM GOAL #3   Title  Pt will improve DGI to >/=17/24 in order to indicate decreased fall risk.     Baseline  06/26/18: 18/24 scored today    Time  --    Period  --    Status  Achieved      PT SHORT TERM GOAL #4   Title  Will assess need for RLE AFO and proceed appropriately.     Baseline  06/26/18: have trialed braces in session, awaiting MD order    Status  Achieved      PT SHORT TERM GOAL #5   Title  Pt will ambulate 500' over unlevel outdoor paved surfaces w/ LRAD (brace as needed) at mod I level in order to return to leisure and community activity.     Time  4    Period  Weeks    Status  On-going                PT Long Term Goals - 05/29/18 1040      PT LONG TERM GOAL #1   Title  Pt will be independnet with final HEP in order to indicate improved functional mobility and decreased fall risk.  (Target Date: 07/28/18)    Time  8    Period  Weeks    Status  New    Target Date  07/28/18      PT LONG TERM GOAL #2   Title  Pt will improve DGI to >19/24 in order to indicate decreased fall risk.      Time  8    Period  Weeks    Status  New      PT LONG TERM GOAL #3   Title  Pt will increase gait speed to >/=2.62 ft/sec in order to indictate safe community ambulation and improved efficiency of gait.     Time  8    Period  Weeks    Status  New      PT LONG TERM GOAL #4   Title  Pt will ambulate 1000' over varying outdoor surfaces (both paved and grassy, ramp/incline/curb) w/ LRAD (brace as needed) at mod I  level in order to indicate safe community negotiation.     Time  8    Period  Weeks    Status  New          06/26/18 0934  Plan  Clinical Impression Statement Today's skilled session focused on progress toward STGs with pt meeting the HEP and Dynamic Gait Index goals. He improved on the gait speed and 5 time sit to stand, just not to goal. Will plan to check outdoor goal next session as it was raining today. Remainder of  session address LE strengthening/balance without any issues reported.   Pt will benefit from skilled therapeutic intervention in order to improve on the following deficits Abnormal gait;Decreased activity tolerance;Decreased balance;Decreased coordination;Decreased endurance;Decreased knowledge of use of DME;Decreased mobility;Decreased range of motion;Decreased strength;Impaired perceived functional ability;Impaired flexibility;Postural dysfunction;Improper body mechanics;Impaired UE functional use;Impaired tone;Impaired sensation  Rehab Potential Good  Clinical Impairments Affecting Rehab Potential length of time since CVA  PT Frequency 2x / week  PT Duration 8 weeks  PT Treatment/Interventions ADLs/Self Care Home Management;Electrical Stimulation;DME Instruction;Gait training;Stair training;Functional mobility training;Therapeutic activities;Therapeutic exercise;Aquatic Therapy;Balance training;Neuromuscular re-education;Cognitive remediation;Patient/family education;Orthotic Fit/Training;Passive range of motion;Energy conservation;Vestibular  PT Next Visit Plan check remaining STG, As pt brings in low top shoes-try allard toe off AFO to ensure still appropriate, continue with Bioness for gait/balance/strengthening, NMR for right LE- tall kneeling, half kneeling activities with emphasis on glut/hamstring activation, ? trial of clinic braces   PT Home Exercise Plan HEP from [place]: [place] and Agree with Plan of Care Patient      Patient will benefit from skilled therapeutic intervention in order to improve the following deficits and impairments:  Abnormal gait, Decreased activity tolerance, Decreased balance, Decreased coordination, Decreased endurance, Decreased knowledge of use of DME, Decreased mobility, Decreased range of motion, Decreased strength, Impaired perceived functional ability, Impaired flexibility, Postural dysfunction, Improper body mechanics, Impaired UE functional  use, Impaired tone, Impaired sensation  Visit Diagnosis: Hemiplegia and hemiparesis following cerebral infarction affecting right dominant side (HCC)  Unsteadiness on feet  Muscle weakness (generalized)  Other abnormalities of gait and mobility     Problem List Patient Active Problem List   Diagnosis Date Noted  . CKD (chronic kidney disease) 12/18/2016  . Benign essential HTN   . Idiopathic gout   . Diastolic dysfunction   . Cocaine abuse ([place])   . Right hemiparesis ([place])   . Monoplegia of upper extremity following cerebral infarction affecting right dominant side ([place])   . Cerebrovascular accident (CVA) ([place])   . Acute ischemic stroke ([place]) 09/15/2016  . Stroke (cerebrum) ([place]) 09/15/2016  . Hypertension 09/15/2016  . Vitamin B12 deficiency 08/10/2015  . Weakness 07/02/2013  . Abnormal CPK 07/02/2013    Willow Ora, PTA, [facility] [address] [place], [place] [address] [phone] 06/29/18, 5:18 PM   Name: Randall Mccall MRN: [id number] Date of Birth: [birth year]

## 2018-07-01 ENCOUNTER — Ambulatory Visit: Payer: Medicare Other | Admitting: Physical Therapy

## 2018-07-01 ENCOUNTER — Encounter: Payer: Self-pay | Admitting: Physical Therapy

## 2018-07-01 ENCOUNTER — Ambulatory Visit: Payer: Medicare Other | Attending: Internal Medicine | Admitting: Occupational Therapy

## 2018-07-01 DIAGNOSIS — I69351 Hemiplegia and hemiparesis following cerebral infarction affecting right dominant side: Secondary | ICD-10-CM | POA: Insufficient documentation

## 2018-07-01 DIAGNOSIS — M6281 Muscle weakness (generalized): Secondary | ICD-10-CM | POA: Insufficient documentation

## 2018-07-01 DIAGNOSIS — R2681 Unsteadiness on feet: Secondary | ICD-10-CM | POA: Diagnosis not present

## 2018-07-01 DIAGNOSIS — R2689 Other abnormalities of gait and mobility: Secondary | ICD-10-CM

## 2018-07-01 NOTE — Therapy (Signed)
[facility] [address] [place], [place], [address] Phone: [phone]   Fax:  [phone]  Physical Therapy Treatment  Patient Details  Name: Randall Mccall MRN: [id number] Date of Birth: 1971-05-27 Referring Provider (PT): Benito Mccreedy, MD   Encounter Date: 07/01/2018  PT End of Session - 07/01/18 1026    Visit Number  8    Number of Visits  17    Date for PT Re-Evaluation  08/27/18    Authorization Type  UHC Medicare and Medicaid    PT Start Time  1021    PT Stop Time  1102    PT Time Calculation (min)  41 min    Equipment Utilized During Treatment  Gait belt    Activity Tolerance  Patient tolerated treatment well    Behavior During Therapy  [facility] for tasks assessed/performed       Past Medical History:  Diagnosis Date  . Gout   . Hypertension   . Muscle weakness   . Stroke [facility])     Past Surgical History:  Procedure Laterality Date  . KNEE SURGERY      There were no vitals filed for this visit.  Subjective Assessment - 07/01/18 1021    Subjective  No new complaints. No falls.     Pertinent History  Pt goes by "[name]"    Limitations  House hold activities;Walking    How long can you stand comfortably?  about 15 mins    How long can you walk comfortably?  about 15 mins     Patient Stated Goals  "Work on my right side and get more balanced."     Currently in Pain?  No/denies                       [facility] Adult PT Treatment/Exercise - 07/01/18 1115      Ambulation/Gait   Ambulation/Gait  Yes    Ambulation/Gait Assistance  5: Supervision;4: Min guard    Ambulation/Gait Assistance Details  Allard toe off brace used on right. LOB x1 when initially walking onto paved sidewalk due to poor clearance of RLE.      Ambulation Distance (Feet)  500 Feet    Assistive device  None    Gait Pattern  Step-through pattern;Decreased arm swing - right;Decreased stance time - right;Decreased  dorsiflexion - right;Decreased weight shift to right    Ambulation Surface  Unlevel;Outdoor;Paved      High Level Balance   High Level Balance Comments  In parallel bars: pt performs toe taps to raised wooden platform. 10 reps with supervision and UE support. 10 reps with min-mod a without UE support and hesitation. Verbal cue to slow pace for safely and for clearance of RLE.       Knee/Hip Exercises: Standing   Forward Lunges  Both;1 set;10 reps    Forward Lunges Limitations  Pt performs mini lunges with min a and heavy reliance on UE. Required verbal cues to lighten UE support and demo for proper exercise technique.     Side Lunges  Both;1 set;10 reps    Side Lunges Limitations  In parallel bars: Pt performs side lunges with min a and heavy reliance on UE. Verbal cues needed to lighten UE support and to keep toes foward during lunge.     Forward Step Up  Both;1 set;10 reps;Hand Hold: 1    Forward Step Up Limitations  Pt performs foward step ups on stairs with opposite  side step through to next step with min a and use of left handrail. Verbal cues and demo needed for proper exercise technique and sequence.      Other Standing Knee Exercises  In parallel bars: pt performs deep squat 2 sets, 10 reps with heavy UE reliance noted requiring min a and verbal cues for technique and lighten UE support.                 PT Short Term Goals - 07/01/18 1142      PT SHORT TERM GOAL #1   Title  Pt will initiate HEP in order to indicate improved functional mobility and decreased fall risk.  (Target Date: 06/28/18)    Baseline  06/26/18: met with current HEP    Status  Achieved      PT SHORT TERM GOAL #2   Title  Pt will perform 5TSS in </=11 secs without UE support and good B LE weight bearing in order to indicate decreased fall risk.     Baseline  06/26/18: 12.31 sec's without UE support, standard height chair, improved just not to goal    Status  Partially Met      PT SHORT TERM GOAL #3    Title  Pt will improve DGI to >/=17/24 in order to indicate decreased fall risk.     Baseline  06/26/18: 18/24 scored today    Status  Achieved      PT SHORT TERM GOAL #4   Title  Will assess need for RLE AFO and proceed appropriately.     Baseline  06/26/18: have trialed braces in session, awaiting MD order    Status  Achieved      PT SHORT TERM GOAL #5   Title  Pt will ambulate 500' over unlevel outdoor paved surfaces w/ LRAD (brace as needed) at mod I level in order to return to leisure and community activity.     Baseline  07/01/18: Pt able to ambulate 500 ft over unlevel paved surfaces with Allard toe off brace on right at supervision and LOB x1.     Time  4    Period  Weeks    Status  Not Met        PT Long Term Goals - 05/29/18 1040      PT LONG TERM GOAL #1   Title  Pt will be independnet with final HEP in order to indicate improved functional mobility and decreased fall risk.  (Target Date: 07/28/18)    Time  8    Period  Weeks    Status  New    Target Date  07/28/18      PT LONG TERM GOAL #2   Title  Pt will improve DGI to >19/24 in order to indicate decreased fall risk.      Time  8    Period  Weeks    Status  New      PT LONG TERM GOAL #3   Title  Pt will increase gait speed to >/=2.62 ft/sec in order to indictate safe community ambulation and improved efficiency of gait.     Time  8    Period  Weeks    Status  New      PT LONG TERM GOAL #4   Title  Pt will ambulate 1000' over varying outdoor surfaces (both paved and grassy, ramp/incline/curb) w/ LRAD (brace as needed) at mod I level in order to indicate safe community negotiation.     Time  8    Period  Weeks    Status  New            Plan - 07/01/18 1141    Clinical Impression Statement  Today's session focused on assessing remaining ambulation STG and BLE functional strengthening/balance. Pt unable to meet remaining STG due to LOB x1 due to poor RLE clearance with use of Allard toe off brace. Pt  performs hip/knee strengthening with heavy reliance on UE and verbal cues/demo for safety and correct exercise form. Pt would benefit from further PT to continue progressing toward established goals.      Rehab Potential  Good    Clinical Impairments Affecting Rehab Potential  length of time since CVA    PT Frequency  2x / week    PT Duration  8 weeks    PT Treatment/Interventions  ADLs/Self Care Home Management;Electrical Stimulation;DME Instruction;Gait training;Stair training;Functional mobility training;Therapeutic activities;Therapeutic exercise;Aquatic Therapy;Balance training;Neuromuscular re-education;Cognitive remediation;Patient/family education;Orthotic Fit/Training;Passive range of motion;Energy conservation;Vestibular    PT Next Visit Plan As pt brings in low top shoes-try allard toe off AFO to ensure still appropriate, continue with Bioness for gait/balance/strengthening, NMR for right LE- tall kneeling, half kneeling activities with emphasis on glut/hamstring activation, ? Try other braces vs orthotic consult    PT Home Exercise Plan  HEP from [place]: [place] and Agree with Plan of Care  Patient       Patient will benefit from skilled therapeutic intervention in order to improve the following deficits and impairments:  Abnormal gait, Decreased activity tolerance, Decreased balance, Decreased coordination, Decreased endurance, Decreased knowledge of use of DME, Decreased mobility, Decreased range of motion, Decreased strength, Impaired perceived functional ability, Impaired flexibility, Postural dysfunction, Improper body mechanics, Impaired UE functional use, Impaired tone, Impaired sensation  Visit Diagnosis: Hemiplegia and hemiparesis following cerebral infarction affecting right dominant side (HCC)  Unsteadiness on feet  Muscle weakness (generalized)  Other abnormalities of gait and mobility     Problem List Patient Active Problem List   Diagnosis Date  Noted  . CKD (chronic kidney disease) 12/18/2016  . Benign essential HTN   . Idiopathic gout   . Diastolic dysfunction   . Cocaine abuse ([place])   . Right hemiparesis ([place])   . Monoplegia of upper extremity following cerebral infarction affecting right dominant side ([place])   . Cerebrovascular accident (CVA) ([place])   . Acute ischemic stroke ([place]) 09/15/2016  . Stroke (cerebrum) ([place]) 09/15/2016  . Hypertension 09/15/2016  . Vitamin B12 deficiency 08/10/2015  . Weakness 07/02/2013  . Abnormal CPK 07/02/2013    Cecile Sheerer, SPTA 07/01/2018, 11:56 AM  [facility] [address] [place], [place], [address] Phone: [phone]   Fax:  [phone]  Name: Randall Mccall MRN: [id number] Date of Birth: 12-14-1970

## 2018-07-01 NOTE — Therapy (Signed)
[facility] [address] [place], [place], [address] Phone: [phone]   Fax:  [phone]  Occupational Therapy Treatment  Patient Details  Name: Randall Mccall MRN: [id number] Date of Birth: 12/22/1970 Referring Provider (OT): Dr. Benito Mccreedy   Encounter Date: 07/01/2018  OT End of Session - 07/01/18 1039    Visit Number  9    Number of Visits  17    Date for OT Re-Evaluation  07/29/18    Authorization Type  UHC MCR primary, MCD secondary    OT Start Time  0935    OT Stop Time  1015    OT Time Calculation (min)  40 min    Activity Tolerance  Patient tolerated treatment well    Behavior During Therapy  [facility] for tasks assessed/performed       Past Medical History:  Diagnosis Date  . Gout   . Hypertension   . Muscle weakness   . Stroke [facility]     Past Surgical History:  Procedure Laterality Date  . KNEE SURGERY      There were no vitals filed for this visit.  Subjective Assessment - 07/01/18 0941    Subjective   I can do the pool therapy    Pertinent History  CVA Jan 2018. PMH: HTN, gout    Patient Stated Goals  Get my Rt hand better so I can drive    Currently in Pain?  No/denies       Further discussed Aquatic Therapy and pt reports caregiver (mom) can bring patient and stay during sessions. O.T. made referral and will coordinate with A.T.   Supine: chest press and shoulder flexion with PVC frame.  Seated: wt bearing through BUE's for forward leaning, followed by sit to squat x 5 Tall kneeling: rolling physioball out BUE's while moving to short kneeling, then returning to tall kneeling as pt brings ball back w/ light wt bearing Standing: wt bearing over RUE only while performing forward reaching activities LUE, followed by wt shifts and trunk rotation w/ lateral reaching LUE. Progressed to BUE wt bearing in standing w/ feet further back from BOS w/ A/P wt  shifts                      OT Short Term Goals - 06/16/18 1341      OT SHORT TERM GOAL #1   Title  Independent with initial HEP - 06/29/18    Time  4    Period  Weeks    Status  Achieved      OT SHORT TERM GOAL #2   Title  Independent with splint wear and care    Time  4    Period  Weeks    Status  Deferred   therapist feels splint may take away function secondary to use of tenodesis, therefore deferred this goal     OT [place] #3   Title  Pt/family to verbalize understanding with A/E recommendations to increase ease, independence and safety with ADLS/IADLS (rocker knife, shoe buttons, one handed cutting board, pot stabalizer)    Time  4    Period  Weeks    Status  Achieved        OT Long Term Goals - 07/01/18 1040      OT LONG TERM GOAL #1   Title  Independent with updated HEP - 07/29/18    Time  8    Period  Weeks  Status  New      OT LONG TERM GOAL #2   Title  Pt to perform low to mid level reaching w/ sufficient finger extension for placement on cylindrical objects for grasp/release    Time  8    Period  Weeks    Status  On-going      OT LONG TERM GOAL #3   Title  Pt to improved RUE function as evidenced by performing 22 blocks or greater on Box & Blocks test    Baseline  eval = 17    Time  8    Period  Weeks    Status  On-going      OT LONG TERM GOAL #4   Title  Pt will consistently use RUE as stabalizer to min assist for low bilateral tasks    Time  8    Period  Weeks    Status  On-going            Plan - 07/01/18 1040    Clinical Impression Statement  Pt continues to make steady progress towards goals. Pt limited only by distal spasticity, however has increased passive wrist extension for weight bearing activities now.     Occupational Profile and client history currently impacting functional performance  CVA 09/15/16, HTN, gout    Occupational performance deficits (Please refer to evaluation for details):   ADL's;IADL's;Social Participation    Rehab Potential  Good    Current Impairments/barriers affecting progress:  time since onset    OT Frequency  2x / week    OT Duration  8 weeks    OT Treatment/Interventions  Self-care/ADL training;Moist Heat;DME and/or AE instruction;Splinting;Aquatic Therapy;Therapeutic activities;Cognitive remediation/compensation;Coping strategies training;Therapeutic exercise;Neuromuscular education;Functional Mobility Training;Passive range of motion;Visual/perceptual remediation/compensation;Electrical Stimulation;Manual Therapy;Patient/family education    Plan  continue NMR RUE/trunk and distal control    Consulted and Agree with Plan of Care  Patient       Patient will benefit from skilled therapeutic intervention in order to improve the following deficits and impairments:  Decreased coordination, Decreased range of motion, Impaired flexibility, Improper body mechanics, Improper spinal/pelvic alignment, Decreased safety awareness, Impaired tone, Decreased knowledge of use of DME, Decreased balance, Impaired UE functional use, Decreased cognition, Decreased mobility, Decreased strength, Impaired vision/preception  Visit Diagnosis: Hemiplegia and hemiparesis following cerebral infarction affecting right dominant side (HCC)  Unsteadiness on feet    Problem List Patient Active Problem List   Diagnosis Date Noted  . CKD (chronic kidney disease) 12/18/2016  . Benign essential HTN   . Idiopathic gout   . Diastolic dysfunction   . Cocaine abuse ([place])   . Right hemiparesis ([place])   . Monoplegia of upper extremity following cerebral infarction affecting right dominant side ([place])   . Cerebrovascular accident (CVA) ([place])   . Acute ischemic stroke ([place]) 09/15/2016  . Stroke (cerebrum) ([place]) 09/15/2016  . Hypertension 09/15/2016  . Vitamin B12 deficiency 08/10/2015  . Weakness 07/02/2013  . Abnormal CPK 07/02/2013    Carey Bullocks, OTR/L 07/01/2018, 10:41  AM  [facility] [address] [place], [place], [address] Phone: [phone]   Fax:  [phone]  Name: Randall Mccall MRN: [id number] Date of Birth: [birth year]

## 2018-07-02 ENCOUNTER — Ambulatory Visit: Payer: Medicare Other | Admitting: Occupational Therapy

## 2018-07-02 DIAGNOSIS — M6281 Muscle weakness (generalized): Secondary | ICD-10-CM

## 2018-07-02 DIAGNOSIS — R2689 Other abnormalities of gait and mobility: Secondary | ICD-10-CM | POA: Diagnosis not present

## 2018-07-02 DIAGNOSIS — R2681 Unsteadiness on feet: Secondary | ICD-10-CM

## 2018-07-02 DIAGNOSIS — I69351 Hemiplegia and hemiparesis following cerebral infarction affecting right dominant side: Secondary | ICD-10-CM | POA: Diagnosis not present

## 2018-07-02 NOTE — Therapy (Addendum)
[facility] [address] [place], [place], [address] Phone: [phone]   Fax:  [phone]  Occupational Therapy Treatment  Patient Details  Name: Randall Mccall MRN: [id number] Date of Birth: [birth year] Referring Provider (OT): Dr. Benito Mccreedy   Encounter Date: 07/02/2018  OT End of Session - 07/02/18 1416    Visit Number  10    Number of Visits  17    Date for OT Re-Evaluation  07/29/18    Authorization Type  UHC MCR primary, MCD secondary    OT Start Time  1320    OT Stop Time  1400    OT Time Calculation (min)  40 min    Activity Tolerance  Patient tolerated treatment well    Behavior During Therapy  [facility] for tasks assessed/performed       Past Medical History:  Diagnosis Date  . Gout   . Hypertension   . Muscle weakness   . Stroke [facility])     Past Surgical History:  Procedure Laterality Date  . KNEE SURGERY      There were no vitals filed for this visit.  Subjective Assessment - 07/02/18 1326    Pertinent History  CVA Jan 2018. PMH: HTN, gout    Patient Stated Goals  Get my Rt hand better so I can drive    Currently in Pain?  No/denies       Box & Blocks RUE = 17  Practiced using BUE's to pick up and carry laundry basket. Pt practicing folding towels using Rt hand as min assist. Discussed functional tasks/ADLS where pt can use Rt hand (dressing, bathing, carrying laundry basket, folding towels) Pt practiced using RUE to open/close drawers/cabinets.  UBE x 5 min. Level 1 for reciprocal movement pattern                      OT Short Term Goals - 06/16/18 1341      OT SHORT TERM GOAL #1   Title  Independent with initial HEP - 06/29/18    Time  4    Period  Weeks    Status  Achieved      OT SHORT TERM GOAL #2   Title  Independent with splint wear and care    Time  4    Period  Weeks    Status  Deferred   therapist feels splint may take away function secondary to use of  tenodesis, therefore deferred this goal     OT [place] #3   Title  Pt/family to verbalize understanding with A/E recommendations to increase ease, independence and safety with ADLS/IADLS (rocker knife, shoe buttons, one handed cutting board, pot stabalizer)    Time  4    Period  Weeks    Status  Achieved        OT Long Term Goals - 07/01/18 1040      OT LONG TERM GOAL #1   Title  Independent with updated HEP - 07/29/18    Time  8    Period  Weeks    Status  New      OT LONG TERM GOAL #2   Title  Pt to perform low to mid level reaching w/ sufficient finger extension for placement on cylindrical objects for grasp/release    Time  8    Period  Weeks    Status  On-going      OT LONG TERM GOAL #3  Title  Pt to improved RUE function as evidenced by performing 22 blocks or greater on Box & Blocks test    Baseline  eval = 17    Time  8    Period  Weeks    Status  On-going      OT LONG TERM GOAL #4   Title  Pt will consistently use RUE as stabalizer to min assist for low bilateral tasks    Time  8    Period  Weeks    Status  On-going            Plan - 07/02/18 1416    Clinical Impression Statement  This 10th progress note covers from 05/29/18 to 07/02/18: pt has met all STG's and approximating LTG's. Pt using RUE more consistently as stabalizer and occaisonal min assist for ADLS and bilateral tasks.     Occupational Profile and client history currently impacting functional performance  CVA 09/15/16, HTN, gout    Occupational performance deficits (Please refer to evaluation for details):  ADL's;IADL's;Social Participation    Rehab Potential  Good    Current Impairments/barriers affecting progress:  time since onset    OT Frequency  2x / week    OT Duration  8 weeks    OT Treatment/Interventions  Self-care/ADL training;Moist Heat;DME and/or AE instruction;Splinting;Aquatic Therapy;Therapeutic activities;Cognitive remediation/compensation;Coping strategies  training;Therapeutic exercise;Neuromuscular education;Functional Mobility Training;Passive range of motion;Visual/perceptual remediation/compensation;Electrical Stimulation;Manual Therapy;Patient/family education    Plan  continue NMR RUE/trunk and distal control    Consulted and Agree with Plan of Care  Patient       Patient will benefit from skilled therapeutic intervention in order to improve the following deficits and impairments:  Decreased coordination, Decreased range of motion, Impaired flexibility, Improper body mechanics, Improper spinal/pelvic alignment, Decreased safety awareness, Impaired tone, Decreased knowledge of use of DME, Decreased balance, Impaired UE functional use, Decreased cognition, Decreased mobility, Decreased strength, Impaired vision/preception  Visit Diagnosis: Hemiplegia and hemiparesis following cerebral infarction affecting right dominant side (HCC)  Unsteadiness on feet  Muscle weakness (generalized)    Problem List Patient Active Problem List   Diagnosis Date Noted  . CKD (chronic kidney disease) 12/18/2016  . Benign essential HTN   . Idiopathic gout   . Diastolic dysfunction   . Cocaine abuse ([place])   . Right hemiparesis ([place])   . Monoplegia of upper extremity following cerebral infarction affecting right dominant side ([place])   . Cerebrovascular accident (CVA) ([place])   . Acute ischemic stroke ([place]) 09/15/2016  . Stroke (cerebrum) ([place]) 09/15/2016  . Hypertension 09/15/2016  . Vitamin B12 deficiency 08/10/2015  . Weakness 07/02/2013  . Abnormal CPK 07/02/2013    Carey Bullocks, OTR/L 07/02/2018, 2:28 PM  [facility] [address] [place], [place], [address] Phone: [phone]   Fax:  [phone]  Name: DONELLE BABA MRN: [id number] Date of Birth: [birth year]

## 2018-07-03 ENCOUNTER — Ambulatory Visit: Payer: Medicare Other | Admitting: Physical Therapy

## 2018-07-03 DIAGNOSIS — R2681 Unsteadiness on feet: Secondary | ICD-10-CM

## 2018-07-03 DIAGNOSIS — I69351 Hemiplegia and hemiparesis following cerebral infarction affecting right dominant side: Secondary | ICD-10-CM | POA: Diagnosis not present

## 2018-07-03 DIAGNOSIS — R2689 Other abnormalities of gait and mobility: Secondary | ICD-10-CM | POA: Diagnosis not present

## 2018-07-03 DIAGNOSIS — M6281 Muscle weakness (generalized): Secondary | ICD-10-CM | POA: Diagnosis not present

## 2018-07-03 NOTE — Therapy (Signed)
[facility] [address] [place], [place], [address] Phone: [phone]   Fax:  [phone]  Physical Therapy Treatment  Patient Details  Name: Randall Mccall MRN: [id number] Date of Birth: 02/20/1971 Referring Provider (PT): Benito Mccreedy, MD   Encounter Date: 07/03/2018  PT End of Session - 07/03/18 1134    Visit Number  9    Number of Visits  17    Date for PT Re-Evaluation  08/27/18    Authorization Type  UHC Medicare and Medicaid    PT Start Time  1016    PT Stop Time  1100    PT Time Calculation (min)  44 min    Equipment Utilized During Treatment  Gait belt    Activity Tolerance  Patient tolerated treatment well    Behavior During Therapy  WFL for tasks assessed/performed       Past Medical History:  Diagnosis Date  . Gout   . Hypertension   . Muscle weakness   . Stroke [facility])     Past Surgical History:  Procedure Laterality Date  . KNEE SURGERY      There were no vitals filed for this visit.  Subjective Assessment - 07/03/18 1020    Subjective  Pt stumbled over right foot and fell yesterday on grass outside by his dog kennel and his uncle had to help him up. Pt denies any injury. Primary PT made aware.    Pertinent History  Pt goes by "Randall Mccall"    Limitations  House hold activities;Walking    How long can you stand comfortably?  about 15 mins    How long can you walk comfortably?  about 15 mins     Patient Stated Goals  "Work on my right side and get more balanced."     Currently in Pain?  No/denies           [facility] Adult PT Treatment/Exercise - 07/03/18 1106      Ambulation/Gait   Ambulation/Gait  Yes    Ambulation/Gait Assistance  5: Supervision    Ambulation/Gait Assistance Details  Trials with blue rocker brace first lap and needed readjustment. Continued with brace for 2 more laps around track with no LOB noted. Improved foot clearance with brace, continued with decreased hip/knee  flexion with swing phase.    Ambulation Distance (Feet)  345 Feet   145f x1; 236fx1   Assistive device  None    Gait Pattern  Step-through pattern;Decreased arm swing - right;Decreased stance time - right;Decreased dorsiflexion - right;Decreased weight shift to right;Decreased step length - right    Ambulation Surface  Level;Indoor      High Level Balance   High Level Balance Comments  In parallel bars: narrow BOS EC on floor progressing to narrow BOS EC on airex pad 3 reps x30 sec with supervision; semi-tendem stance on airex pad 3 reps x30 sec with supervision; squats on airex pad with UE support progressing to mini squats with no UE support requiring min a to maintain balance, verbal cues and demo needed for proper technique and equal weight bearing on BLE.        Neuro Re-ed    Neuro Re-ed Details   In parallel bars: resisted walking foward and backwards with green Tband x3 laps. pt required min a to maintain balance with LOBx1. verbal cues to and increase step height and length of RLE. Pt Bilateral LEs fatigued quickly        Exercises  Other Exercises   weighted high marches foward and backward x 2 laps in parallel bars with min assist and verbal cues to increase step height.           PT Short Term Goals - 07/01/18 1142      PT SHORT TERM GOAL #1   Title  Pt will initiate HEP in order to indicate improved functional mobility and decreased fall risk.  (Target Date: 06/28/18)    Baseline  06/26/18: met with current HEP    Status  Achieved      PT SHORT TERM GOAL #2   Title  Pt will perform 5TSS in </=11 secs without UE support and good B LE weight bearing in order to indicate decreased fall risk.     Baseline  06/26/18: 12.31 sec's without UE support, standard height chair, improved just not to goal    Status  Partially Met      PT SHORT TERM GOAL #3   Title  Pt will improve DGI to >/=17/24 in order to indicate decreased fall risk.     Baseline  06/26/18: 18/24 scored today     Status  Achieved      PT SHORT TERM GOAL #4   Title  Will assess need for RLE AFO and proceed appropriately.     Baseline  06/26/18: have trialed braces in session, awaiting MD order    Status  Achieved      PT SHORT TERM GOAL #5   Title  Pt will ambulate 500' over unlevel outdoor paved surfaces w/ LRAD (brace as needed) at mod I level in order to return to leisure and community activity.     Baseline  07/01/18: Pt able to ambulate 500 ft over unlevel paved surfaces with [place] toe off brace on right at supervision and LOB x1.     Time  4    Period  Weeks    Status  Not Met        PT Long Term Goals - 05/29/18 1040      PT LONG TERM GOAL #1   Title  Pt will be independnet with final HEP in order to indicate improved functional mobility and decreased fall risk.  (Target Date: 07/28/18)    Time  8    Period  Weeks    Status  New    Target Date  07/28/18      PT LONG TERM GOAL #2   Title  Pt will improve DGI to >19/24 in order to indicate decreased fall risk.      Time  8    Period  Weeks    Status  New      PT LONG TERM GOAL #3   Title  Pt will increase gait speed to >/=2.62 ft/sec in order to indictate safe community ambulation and improved efficiency of gait.     Time  8    Period  Weeks    Status  New      PT LONG TERM GOAL #4   Title  Pt will ambulate 1000' over varying outdoor surfaces (both paved and grassy, ramp/incline/curb) w/ LRAD (brace as needed) at mod I level in order to indicate safe community negotiation.     Time  8    Period  Weeks    Status  New        Plan - 07/03/18 1137    Clinical Impression Statement  Today's session focused on gait training with blue rocker brace with  no LOB noted and neuromuscular re-ed emphasizing single leg stabilization and increased muscle activation of RLE against resistance requiring up to min assist to maintain balance. Verbal cues needed to increase step length, weight bearing on RLE, and for proper exercise technique.  Pt would benefit fro further PT to continuing progressing toward established goals.     Rehab Potential  Good    Clinical Impairments Affecting Rehab Potential  length of time since CVA    PT Frequency  2x / week    PT Duration  8 weeks    PT Treatment/Interventions  ADLs/Self Care Home Management;Electrical Stimulation;DME Instruction;Gait training;Stair training;Functional mobility training;Therapeutic activities;Therapeutic exercise;Aquatic Therapy;Balance training;Neuromuscular re-education;Cognitive remediation;Patient/family education;Orthotic Fit/Training;Passive range of motion;Energy conservation;Vestibular    PT Next Visit Plan  10 visit progress note due next session; continue with [place] for gait/balance/strengthening, NMR for right LE- tall kneeling, half kneeling activities with emphasis on glut/hamstring activation, ? trial of clinic braces- have tried the toe off and blue rocker to date. ? need for orthotic consult in session with orthotist    PT Home Exercise Plan  HEP from [place]: [place] and Agree with Plan of Care  Patient    Family Member Consulted  Mother [name]             Patient will benefit from skilled therapeutic intervention in order to improve the following deficits and impairments:  Abnormal gait, Decreased activity tolerance, Decreased balance, Decreased coordination, Decreased endurance, Decreased knowledge of use of DME, Decreased mobility, Decreased range of motion, Decreased strength, Impaired perceived functional ability, Impaired flexibility, Postural dysfunction, Improper body mechanics, Impaired UE functional use, Impaired tone, Impaired sensation  Visit Diagnosis: Hemiplegia and hemiparesis following cerebral infarction affecting right dominant side (HCC)  Unsteadiness on feet  Muscle weakness (generalized)  Other abnormalities of gait and mobility     Problem List Patient Active Problem List   Diagnosis Date Noted  . CKD  (chronic kidney disease) 12/18/2016  . Benign essential HTN   . Idiopathic gout   . Diastolic dysfunction   . Cocaine abuse ([place])   . Right hemiparesis ([place])   . Monoplegia of upper extremity following cerebral infarction affecting right dominant side ([place])   . Cerebrovascular accident (CVA) ([place])   . Acute ischemic stroke ([place]) 09/15/2016  . Stroke (cerebrum) ([place]) 09/15/2016  . Hypertension 09/15/2016  . Vitamin B12 deficiency 08/10/2015  . Weakness 07/02/2013  . Abnormal CPK 07/02/2013    Cecile Sheerer, SPTA 07/03/2018, 12:08 PM  [facility] [address] [place], [place], [address] Phone: [phone]   Fax:  [phone]  Name: KODI STEIL MRN: [id number] Date of Birth: 01/15/1971

## 2018-07-07 ENCOUNTER — Ambulatory Visit: Payer: Medicare Other | Admitting: Rehabilitation

## 2018-07-07 ENCOUNTER — Encounter: Payer: Self-pay | Admitting: Rehabilitation

## 2018-07-07 DIAGNOSIS — I69351 Hemiplegia and hemiparesis following cerebral infarction affecting right dominant side: Secondary | ICD-10-CM

## 2018-07-07 DIAGNOSIS — M6281 Muscle weakness (generalized): Secondary | ICD-10-CM | POA: Diagnosis not present

## 2018-07-07 DIAGNOSIS — R2689 Other abnormalities of gait and mobility: Secondary | ICD-10-CM | POA: Diagnosis not present

## 2018-07-07 DIAGNOSIS — R2681 Unsteadiness on feet: Secondary | ICD-10-CM | POA: Diagnosis not present

## 2018-07-07 NOTE — Therapy (Signed)
[facility] [address] [place], [place], [address] Phone: [phone]   Fax:  [phone]  Physical Therapy Treatment and Progress Note   Patient Details  Name: Randall Mccall MRN: [id number] Date of Birth: 1971/06/26 Referring Provider (PT): Benito Mccreedy, MD   Encounter Date: 07/07/2018  PT End of Session - 07/07/18 2016    Visit Number  10    Number of Visits  17    Date for PT Re-Evaluation  08/27/18    Authorization Type  UHC Medicare and Medicaid    PT Start Time  2482    PT Stop Time  1400    PT Time Calculation (min)  43 min    Equipment Utilized During Treatment  Gait belt    Activity Tolerance  Patient tolerated treatment well    Behavior During Therapy  [facility] for tasks assessed/performed       Past Medical History:  Diagnosis Date  . Gout   . Hypertension   . Muscle weakness   . Stroke [facility])     Past Surgical History:  Procedure Laterality Date  . KNEE SURGERY      There were no vitals filed for this visit.  Subjective Assessment - 07/07/18 2004    Subjective  Pt reports no changes since last visit, no falls.     Pertinent History  Pt goes by "[name]"    Limitations  House hold activities;Walking    How long can you stand comfortably?  about 15 mins    How long can you walk comfortably?  about 15 mins     Patient Stated Goals  "Work on my right side and get more balanced."     Currently in Pain?  No/denies                       [facility] Adult PT Treatment/Exercise - 07/07/18 1320      Ambulation/Gait   Ambulation/Gait  Yes    Ambulation/Gait Assistance  5: Supervision    Ambulation/Gait Assistance Details  Continue to address RLE deficits with blue rocker AFO this session.  Note good foot clearance and knee control in this brace, however pt reports he does like other brace "better" (toe off AFO) as blue rocker feels "too stiff."  PT will follow up and call MD for order  and will plan to have orthotist come to future session for more formal assessment.      Ambulation Distance (Feet)  230 Feet   300'   Assistive device  None    Gait Pattern  Step-through pattern;Decreased arm swing - right;Decreased stance time - right;Decreased dorsiflexion - right;Decreased weight shift to right;Decreased step length - right    Ambulation Surface  Level;Indoor      Neuro Re-ed    Neuro Re-ed Details   Continue to note that during gait, pt doesn't fully load RLE during stance with increased R hip retraction.  Addressed this with NMR tasks as listed: tall kneeling squats x 10 reps, L half kneeling with light UE support from PT with cues for improved hip extension.  Also had pt shift weight forward for increased R hip flexor stretch.  Pt tolerated well.  Continue to encourage hip flexor stretch at home.  Standing without support with R foot placed anterior to LLE (staggered stance) shifting weight onto RLE and back to LLE x 15 reps with cues for increased R hip extension and less forward trunk flexion.  Continued with R LE placed on 4" step elevating onto step and lifting LLE from floor reaching upward for cabinet with RUE again to increase R LE activation and R hip extension.  Pt did very well therefore carried over to gait with emphasis on slower gait speed to improve postural control, R hip extension during stance phase of gait and forward gaze.  Pt continues to have slight increase in R trunk flex during swing, but is improved within session.  Also note that he tends to "fall" on LLE during terminal stance on RLE.  Quickly assessed for leg length descrepancy and note that RLE is slightly longer (approx 1/2") but did decrease slightly when assisted into long sitting, therefore could be related to muscle imbalance.  Pt also notes he has scoliosis which could be causing leg length.  Will continue to address in future session.              PT Education - 07/07/18 2016    Education  Details  getting order for brace, assessing for leg length descrepancy    Person(s) Educated  Patient    Methods  Explanation    Comprehension  Verbalized understanding       PT Short Term Goals - 07/01/18 1142      PT SHORT TERM GOAL #1   Title  Pt will initiate HEP in order to indicate improved functional mobility and decreased fall risk.  (Target Date: 06/28/18)    Baseline  06/26/18: met with current HEP    Status  Achieved      PT SHORT TERM GOAL #2   Title  Pt will perform 5TSS in </=11 secs without UE support and good B LE weight bearing in order to indicate decreased fall risk.     Baseline  06/26/18: 12.31 sec's without UE support, standard height chair, improved just not to goal    Status  Partially Met      PT SHORT TERM GOAL #3   Title  Pt will improve DGI to >/=17/24 in order to indicate decreased fall risk.     Baseline  06/26/18: 18/24 scored today    Status  Achieved      PT SHORT TERM GOAL #4   Title  Will assess need for RLE AFO and proceed appropriately.     Baseline  06/26/18: have trialed braces in session, awaiting MD order    Status  Achieved      PT SHORT TERM GOAL #5   Title  Pt will ambulate 500' over unlevel outdoor paved surfaces w/ LRAD (brace as needed) at mod I level in order to return to leisure and community activity.     Baseline  07/01/18: Pt able to ambulate 500 ft over unlevel paved surfaces with Allard toe off brace on right at supervision and LOB x1.     Time  4    Period  Weeks    Status  Not Met        PT Long Term Goals - 05/29/18 1040      PT LONG TERM GOAL #1   Title  Pt will be independnet with final HEP in order to indicate improved functional mobility and decreased fall risk.  (Target Date: 07/28/18)    Time  8    Period  Weeks    Status  New    Target Date  07/28/18      PT LONG TERM GOAL #2   Title  Pt will improve DGI to >  19/24 in order to indicate decreased fall risk.      Time  8    Period  Weeks    Status  New       PT LONG TERM GOAL #3   Title  Pt will increase gait speed to >/=2.62 ft/sec in order to indictate safe community ambulation and improved efficiency of gait.     Time  8    Period  Weeks    Status  New      PT LONG TERM GOAL #4   Title  Pt will ambulate 1000' over varying outdoor surfaces (both paved and grassy, ramp/incline/curb) w/ LRAD (brace as needed) at mod I level in order to indicate safe community negotiation.     Time  8    Period  Weeks    Status  New          Progress Note Reporting Period 05/29/18 to 07/07/18  See note below for Objective Data and Assessment of Progress/Goals.         Plan - 07/07/18 2017    Clinical Impression Statement  Skilled session continues to assess for most appropriate AFO.  Pt continues to do well with blue rocker AFO as this is best fitting brace for shoe size, however he prefers toe off AFO due to blue rocker being more restrictive.  Will call MD office to get order and plan to have orthotist come to future session for more formal assessment.  Also continue to work on [employer] for imrpoved RLE activation and postural control during gait.      Rehab Potential  Good    Clinical Impairments Affecting Rehab Potential  length of time since CVA    PT Frequency  2x / week    PT Duration  8 weeks    PT Treatment/Interventions  ADLs/Self Care Home Management;Electrical Stimulation;DME Instruction;Gait training;Stair training;Functional mobility training;Therapeutic activities;Therapeutic exercise;Aquatic Therapy;Balance training;Neuromuscular re-education;Cognitive remediation;Patient/family education;Orthotic Fit/Training;Passive range of motion;Energy conservation;Vestibular    PT Next Visit Plan  continue with Bioness for gait/balance/strengthening, NMR for right LE- tall kneeling, half kneeling activities with emphasis on glut/hamstring activation, He prefers toe off however it is too small for shoe.      PT Home Exercise Plan  HEP from [place]:  [id number]    Recommended Other Services  Raquel Sarna to call [facility] and MD to get order and have chris come over for consult.     Consulted and Agree with Plan of Care  Patient    Family Member Consulted  Mother [name]       Patient will benefit from skilled therapeutic intervention in order to improve the following deficits and impairments:  Abnormal gait, Decreased activity tolerance, Decreased balance, Decreased coordination, Decreased endurance, Decreased knowledge of use of DME, Decreased mobility, Decreased range of motion, Decreased strength, Impaired perceived functional ability, Impaired flexibility, Postural dysfunction, Improper body mechanics, Impaired UE functional use, Impaired tone, Impaired sensation  Visit Diagnosis: Hemiplegia and hemiparesis following cerebral infarction affecting right dominant side (HCC)  Unsteadiness on feet  Muscle weakness (generalized)  Other abnormalities of gait and mobility     Problem List Patient Active Problem List   Diagnosis Date Noted  . CKD (chronic kidney disease) 12/18/2016  . Benign essential HTN   . Idiopathic gout   . Diastolic dysfunction   . Cocaine abuse ([place])   . Right hemiparesis ([place])   . Monoplegia of upper extremity following cerebral infarction affecting right dominant side ([place])   . Cerebrovascular accident (CVA) (  [place])   . Acute ischemic stroke ([place]) 09/15/2016  . Stroke (cerebrum) ([place]) 09/15/2016  . Hypertension 09/15/2016  . Vitamin B12 deficiency 08/10/2015  . Weakness 07/02/2013  . Abnormal CPK 07/02/2013    Cameron Sprang, PT, MPT [facility] [address] [place], [place], [address] Phone: [phone]   Fax:  [phone] 07/07/18, 8:25 PM  Name: Randall Mccall MRN: [id number] Date of Birth: [birth year]

## 2018-07-08 ENCOUNTER — Ambulatory Visit: Payer: Medicare Other | Admitting: Occupational Therapy

## 2018-07-09 ENCOUNTER — Ambulatory Visit: Payer: Medicare Other | Admitting: Physical Therapy

## 2018-07-09 ENCOUNTER — Ambulatory Visit: Payer: Medicare Other | Admitting: Occupational Therapy

## 2018-07-09 ENCOUNTER — Encounter: Payer: Self-pay | Admitting: Physical Therapy

## 2018-07-09 DIAGNOSIS — M6281 Muscle weakness (generalized): Secondary | ICD-10-CM | POA: Diagnosis not present

## 2018-07-09 DIAGNOSIS — R2681 Unsteadiness on feet: Secondary | ICD-10-CM

## 2018-07-09 DIAGNOSIS — I69351 Hemiplegia and hemiparesis following cerebral infarction affecting right dominant side: Secondary | ICD-10-CM | POA: Diagnosis not present

## 2018-07-09 DIAGNOSIS — R2689 Other abnormalities of gait and mobility: Secondary | ICD-10-CM

## 2018-07-09 NOTE — Therapy (Signed)
[facility] [address] [place], [place], [address] Phone: [phone]   Fax:  [phone]  Occupational Therapy Treatment  Patient Details  Name: Randall Mccall MRN: [id number] Date of Birth: Jun 12, 1971 Referring Provider (OT): Dr. Benito Mccreedy   Encounter Date: 07/09/2018  OT End of Session - 07/09/18 0844    Visit Number  11    Number of Visits  17    Date for OT Re-Evaluation  07/29/18    Authorization Type  UHC MCR primary, MCD secondary    OT Start Time  0800    OT Stop Time  0845    OT Time Calculation (min)  45 min    Activity Tolerance  Patient tolerated treatment well    Behavior During Therapy  [facility] for tasks assessed/performed       Past Medical History:  Diagnosis Date  . Gout   . Hypertension   . Muscle weakness   . Stroke [facility])     Past Surgical History:  Procedure Laterality Date  . KNEE SURGERY      There were no vitals filed for this visit.  Subjective Assessment - 07/09/18 0806    Pertinent History  CVA Jan 2018. PMH: HTN, gout  (Pended)     Patient Stated Goals  Get my Rt hand better so I can drive  (Pended)     Currently in Pain?  No/denies  (Pended)         Supine: chest press, followed by shoulder flexion BUE's w/ PVC frame.  Tall kneeling: bilateral shoulder flexion along ball while moving to short kneeling, then transitioning back to tall kneeling rolling ball back. Holding ball BUE's to perform trunk rotation bilaterally in tall kneeling Seated: Wt bearing over BUE's in prep from sit-stand, followed by sit to squat x 5 for wt bearing and Rt wrist extension stretch. AA/ROM RUE in abduction (supported on ball) midrange level; progressed to low level A/ROM for functional reaching. Pt then moving from low level flexion to abduction and back for functional reaching, grasp and releasing.  Standing: wt shifts LE's for ipsilateral and contralateral reaching BUE's. Picking up  physioball from floor and bringing to low level diagonal patterns each side BUE's UBE x 5 min for reciprocal movement patterns                      OT Short Term Goals - 06/16/18 1341      OT SHORT TERM GOAL #1   Title  Independent with initial HEP - 06/29/18    Time  4    Period  Weeks    Status  Achieved      OT SHORT TERM GOAL #2   Title  Independent with splint wear and care    Time  4    Period  Weeks    Status  Deferred   therapist feels splint may take away function secondary to use of tenodesis, therefore deferred this goal     OT [place] #3   Title  Pt/family to verbalize understanding with A/E recommendations to increase ease, independence and safety with ADLS/IADLS (rocker knife, shoe buttons, one handed cutting board, pot stabalizer)    Time  4    Period  Weeks    Status  Achieved        OT Long Term Goals - 07/01/18 1040      OT LONG TERM GOAL #1   Title  Independent with  updated HEP - 07/29/18    Time  8    Period  Weeks    Status  New      OT LONG TERM GOAL #2   Title  Pt to perform low to mid level reaching w/ sufficient finger extension for placement on cylindrical objects for grasp/release    Time  8    Period  Weeks    Status  On-going      OT LONG TERM GOAL #3   Title  Pt to improved RUE function as evidenced by performing 22 blocks or greater on Box & Blocks test    Baseline  eval = 17    Time  8    Period  Weeks    Status  On-going      OT LONG TERM GOAL #4   Title  Pt will consistently use RUE as stabalizer to min assist for low bilateral tasks    Time  8    Period  Weeks    Status  On-going            Plan - 07/09/18 0844    Clinical Impression Statement  Pt progressing towards goals. Pt continues to demo progress; pt most limited by ability to perform wrist and finger extension compositely for release and placement of objects    Occupational Profile and client history currently impacting functional  performance  CVA 09/15/16, HTN, gout    Occupational performance deficits (Please refer to evaluation for details):  ADL's;IADL's;Social Participation    Rehab Potential  Good    OT Frequency  2x / week    OT Duration  8 weeks    OT Treatment/Interventions  Self-care/ADL training;Moist Heat;DME and/or AE instruction;Splinting;Aquatic Therapy;Therapeutic activities;Cognitive remediation/compensation;Coping strategies training;Therapeutic exercise;Neuromuscular education;Functional Mobility Training;Passive range of motion;Visual/perceptual remediation/compensation;Electrical Stimulation;Manual Therapy;Patient/family education    Plan  continue NMR RUE/trunk and distal control    Consulted and Agree with Plan of Care  Patient       Patient will benefit from skilled therapeutic intervention in order to improve the following deficits and impairments:  Decreased coordination, Decreased range of motion, Impaired flexibility, Improper body mechanics, Improper spinal/pelvic alignment, Decreased safety awareness, Impaired tone, Decreased knowledge of use of DME, Decreased balance, Impaired UE functional use, Decreased cognition, Decreased mobility, Decreased strength, Impaired vision/preception  Visit Diagnosis: Hemiplegia and hemiparesis following cerebral infarction affecting right dominant side (HCC)  Unsteadiness on feet  Muscle weakness (generalized)    Problem List Patient Active Problem List   Diagnosis Date Noted  . CKD (chronic kidney disease) 12/18/2016  . Benign essential HTN   . Idiopathic gout   . Diastolic dysfunction   . Cocaine abuse ([place])   . Right hemiparesis ([place])   . Monoplegia of upper extremity following cerebral infarction affecting right dominant side ([place])   . Cerebrovascular accident (CVA) ([place])   . Acute ischemic stroke ([place]) 09/15/2016  . Stroke (cerebrum) ([place]) 09/15/2016  . Hypertension 09/15/2016  . Vitamin B12 deficiency 08/10/2015  . Weakness 07/02/2013  .  Abnormal CPK 07/02/2013    Carey Bullocks, OTR/L 07/09/2018, 8:54 AM  [facility] [address] [place], [place], [address] Phone: [phone]   Fax:  [phone]  Name: CHRLES SELLEY MRN: [id number] Date of Birth: 09-28-1970

## 2018-07-09 NOTE — Therapy (Signed)
[facility] [address] [place], [place], [address] Phone: [phone]   Fax:  [phone]  Physical Therapy Treatment  Patient Details  Name: Randall Mccall MRN: [id number] Date of Birth: February 01, 1971 Referring Provider (PT): Benito Mccreedy, MD   Encounter Date: 07/09/2018  PT End of Session - 07/09/18 0949    Visit Number  11    Number of Visits  17    Date for PT Re-Evaluation  08/27/18    Authorization Type  UHC Medicare and Medicaid    PT Start Time  0845    PT Stop Time  0930    PT Time Calculation (min)  45 min    Equipment Utilized During Treatment  Gait belt    Activity Tolerance  Patient tolerated treatment well    Behavior During Therapy  [facility] for tasks assessed/performed       Past Medical History:  Diagnosis Date  . Gout   . Hypertension   . Muscle weakness   . Stroke [facility])     Past Surgical History:  Procedure Laterality Date  . KNEE SURGERY      There were no vitals filed for this visit.  Subjective Assessment - 07/09/18 0847    Subjective  Pt reports no falls since last visit. No new complaints.     Pertinent History  Pt goes by "Randall Mccall"    Limitations  House hold activities;Walking    How long can you stand comfortably?  about 15 mins    How long can you walk comfortably?  about 15 mins     Patient Stated Goals  "Work on my right side and get more balanced."     Currently in Pain?  No/denies         [facility] Adult PT Treatment/Exercise - 07/09/18 0814      Ambulation/Gait   Ambulation/Gait  Yes    Ambulation/Gait Assistance  5: Supervision    Ambulation/Gait Assistance Details  Used blue rocker brace. verbal cues to emphasize heel strike and toe off on RLE. No LOB noted with good RLE clearance.     Ambulation Distance (Feet)  350 Feet    Assistive device  None    Gait Pattern  Step-through pattern;Decreased arm swing - right;Decreased stance time - right;Decreased stride  length;Decreased dorsiflexion - right;Decreased weight shift to right    Ambulation Surface  Level;Indoor      Exercises   Exercises  Knee/Hip    Other Exercises   On red mat: tall kneeling walking foward, backwards, and to side across mat x3 laps; tall kneeling mini squats x10 reps, tall kneeling trunk rotation x10 reps; quadraped fire hydrant x10 reps, min asisst to maintain balance and to stabilize RUE; tall kneeling kick backs x10 reps. demo and verbal cues for exercise technique, verbal and tactile cues for upright posture.       Knee/Hip Exercises: Supine   Bridges  Strengthening;Both;2 sets;10 reps   red Tband used around knees on second rep    Bridges Limitations  Approximation to right LE during bridges to promote weight bearing and muscle activiation          PT Short Term Goals - 07/01/18 1142      PT SHORT TERM GOAL #1   Title  Pt will initiate HEP in order to indicate improved functional mobility and decreased fall risk.  (Target Date: 06/28/18)    Baseline  06/26/18: met with current HEP    Status  Achieved  PT SHORT TERM GOAL #2   Title  Pt will perform 5TSS in </=11 secs without UE support and good B LE weight bearing in order to indicate decreased fall risk.     Baseline  06/26/18: 12.31 sec's without UE support, standard height chair, improved just not to goal    Status  Partially Met      PT SHORT TERM GOAL #3   Title  Pt will improve DGI to >/=17/24 in order to indicate decreased fall risk.     Baseline  06/26/18: 18/24 scored today    Status  Achieved      PT SHORT TERM GOAL #4   Title  Will assess need for RLE AFO and proceed appropriately.     Baseline  06/26/18: have trialed braces in session, awaiting MD order    Status  Achieved      PT SHORT TERM GOAL #5   Title  Pt will ambulate 500' over unlevel outdoor paved surfaces w/ LRAD (brace as needed) at mod I level in order to return to leisure and community activity.     Baseline  07/01/18: Pt able to  ambulate 500 ft over unlevel paved surfaces with Allard toe off brace on right at supervision and LOB x1.     Time  4    Period  Weeks    Status  Not Met        PT Long Term Goals - 05/29/18 1040      PT LONG TERM GOAL #1   Title  Pt will be independnet with final HEP in order to indicate improved functional mobility and decreased fall risk.  (Target Date: 07/28/18)    Time  8    Period  Weeks    Status  New    Target Date  07/28/18      PT LONG TERM GOAL #2   Title  Pt will improve DGI to >19/24 in order to indicate decreased fall risk.      Time  8    Period  Weeks    Status  New      PT LONG TERM GOAL #3   Title  Pt will increase gait speed to >/=2.62 ft/sec in order to indictate safe community ambulation and improved efficiency of gait.     Time  8    Period  Weeks    Status  New      PT LONG TERM GOAL #4   Title  Pt will ambulate 1000' over varying outdoor surfaces (both paved and grassy, ramp/incline/curb) w/ LRAD (brace as needed) at mod I level in order to indicate safe community negotiation.     Time  8    Period  Weeks    Status  New            Plan - 07/09/18 0951    Clinical Impression Statement  Today's session focused on glute/hamstring activation exercises in tall kneeling and quadraped postion as well as gait training with blue rocker brace. RLE muscle fatigue noted quickly, however pt was able to perform all exercises without complication with multiple rest breaks. Pt is very motivated and would benefit from further PT to continue progressing toward established goals.     Rehab Potential  Good    Clinical Impairments Affecting Rehab Potential  length of time since CVA    PT Frequency  2x / week    PT Duration  8 weeks    PT Treatment/Interventions  ADLs/Self Care  Home Management;Electrical Stimulation;DME Instruction;Gait training;Stair training;Functional mobility training;Therapeutic activities;Therapeutic exercise;Aquatic Therapy;Balance  training;Neuromuscular re-education;Cognitive remediation;Patient/family education;Orthotic Fit/Training;Passive range of motion;Energy conservation;Vestibular    PT Next Visit Plan  continue with Bioness for gait/balance/strengthening, NMR for right LE- tall kneeling, half kneeling activities with emphasis on glut/hamstring activation, He prefers toe off however it is too small for shoe.      PT Home Exercise Plan  HEP from [place]: [place] and Agree with Plan of Care  Patient    Family Member Consulted  Mother [name]       Patient will benefit from skilled therapeutic intervention in order to improve the following deficits and impairments:  Abnormal gait, Decreased activity tolerance, Decreased balance, Decreased coordination, Decreased endurance, Decreased knowledge of use of DME, Decreased mobility, Decreased range of motion, Decreased strength, Impaired perceived functional ability, Impaired flexibility, Postural dysfunction, Improper body mechanics, Impaired UE functional use, Impaired tone, Impaired sensation  Visit Diagnosis: Hemiplegia and hemiparesis following cerebral infarction affecting right dominant side (HCC)  Unsteadiness on feet  Muscle weakness (generalized)  Other abnormalities of gait and mobility     Problem List Patient Active Problem List   Diagnosis Date Noted  . CKD (chronic kidney disease) 12/18/2016  . Benign essential HTN   . Idiopathic gout   . Diastolic dysfunction   . Cocaine abuse ([place])   . Right hemiparesis ([place])   . Monoplegia of upper extremity following cerebral infarction affecting right dominant side ([place])   . Cerebrovascular accident (CVA) ([place])   . Acute ischemic stroke ([place]) 09/15/2016  . Stroke (cerebrum) ([place]) 09/15/2016  . Hypertension 09/15/2016  . Vitamin B12 deficiency 08/10/2015  . Weakness 07/02/2013  . Abnormal CPK 07/02/2013    Cecile Sheerer, SPTA 07/09/2018, 9:59 AM  [facility] [address] [place], [place], [address] Phone: [phone]   Fax:  [phone]  Name: Randall Mccall MRN: [id number] Date of Birth: 11-05-1970

## 2018-07-14 ENCOUNTER — Encounter: Payer: Self-pay | Admitting: Physical Therapy

## 2018-07-14 ENCOUNTER — Ambulatory Visit: Payer: Medicare Other | Admitting: Physical Therapy

## 2018-07-14 ENCOUNTER — Ambulatory Visit: Payer: Medicare Other | Admitting: Occupational Therapy

## 2018-07-14 DIAGNOSIS — R2689 Other abnormalities of gait and mobility: Secondary | ICD-10-CM | POA: Diagnosis not present

## 2018-07-14 DIAGNOSIS — M6281 Muscle weakness (generalized): Secondary | ICD-10-CM

## 2018-07-14 DIAGNOSIS — R2681 Unsteadiness on feet: Secondary | ICD-10-CM

## 2018-07-14 DIAGNOSIS — I69351 Hemiplegia and hemiparesis following cerebral infarction affecting right dominant side: Secondary | ICD-10-CM

## 2018-07-14 NOTE — Therapy (Signed)
[facility] [address] [place], [place], [address] Phone: [phone]   Fax:  [phone]  Physical Therapy Treatment  Patient Details  Name: Randall Mccall MRN: [id number] Date of Birth: 08/23/1971 Referring Provider (PT): Benito Mccreedy, MD   Encounter Date: 07/14/2018  PT End of Session - 07/14/18 1248    Visit Number  12    Number of Visits  17    Date for PT Re-Evaluation  08/27/18    Authorization Type  UHC Medicare and Medicaid    PT Start Time  1148    PT Stop Time  1238    PT Time Calculation (min)  50 min    Equipment Utilized During Treatment  Gait belt    Activity Tolerance  Patient tolerated treatment well    Behavior During Therapy  WFL for tasks assessed/performed       Past Medical History:  Diagnosis Date  . Gout   . Hypertension   . Muscle weakness   . Stroke [facility])     Past Surgical History:  Procedure Laterality Date  . KNEE SURGERY      There were no vitals filed for this visit.  Subjective Assessment - 07/14/18 1154    Subjective  Since last visit, fell doing sit<>stands exercise from the bed, lost his grip. no injury, was able to get up be himself from the floor.    Patient is accompained by:  Family member    Pertinent History  Pt goes by "[name] hold activities;Walking    How long can you stand comfortably?  about 15 mins    How long can you walk comfortably?  about 15 mins     Patient Stated Goals  "Work on my right side and get more balanced."     Currently in Pain?  No/denies                       [facility] Adult PT Treatment/Exercise - 07/14/18 0001      Ambulation/Gait   Ambulation/Gait  Yes    Ambulation/Gait Assistance  5: Supervision    Ambulation/Gait Assistance Details  Used blue rocker brace. verbal cues to emphasize heel strike and toe off on RLE. No LOB noted with good RLE clearance but continues to circumduct RLE to swing  it forward, cues to weight shift over the RLE more during  Terminal stance phase.     Ambulation Distance (Feet)  230 Feet    Assistive device  None;Other (Comment)   Blue Rocker   Gait Pattern  Step-through pattern;Decreased arm swing - right;Decreased stance time - right;Decreased stride length;Decreased dorsiflexion - right;Decreased weight shift to right    Ambulation Surface  Level;Indoor      Exercises   Exercises  Knee/Hip;Lumbar       Exercises: Modified Quadruped, tall kneel, 1/2 kneel   Other  Tall knee- lateral weight shifts, 1/2 knee with left UE support ( min A) - mini suats with right LE in supporting position,  Modified quadruped position with UE s on mat- alternate hip flexion (marching), R LE extensions (siding LE back and holding extension)           Balance Exercises - 07/14/18 1202      Balance Exercises: Standing   Standing Eyes Opened  Wide (BOA);Foam/compliant surface (blue balance beam);Head turns   + holding ball with bilateral UEs, upper trunk roatations, Min guard.   Sit  to Stand Time  From mat table, no UE support, progressing with stagger stance, then standing on foam balance beam + head turns          PT Short Term Goals - 07/01/18 1142      PT SHORT TERM GOAL #1   Title  Pt will initiate HEP in order to indicate improved functional mobility and decreased fall risk.  (Target Date: 06/28/18)    Baseline  06/26/18: met with current HEP    Status  Achieved      PT SHORT TERM GOAL #2   Title  Pt will perform 5TSS in </=11 secs without UE support and good B LE weight bearing in order to indicate decreased fall risk.     Baseline  06/26/18: 12.31 sec's without UE support, standard height chair, improved just not to goal    Status  Partially Met      PT SHORT TERM GOAL #3   Title  Pt will improve DGI to >/=17/24 in order to indicate decreased fall risk.     Baseline  06/26/18: 18/24 scored today    Status  Achieved      PT SHORT TERM GOAL #4    Title  Will assess need for RLE AFO and proceed appropriately.     Baseline  06/26/18: have trialed braces in session, awaiting MD order    Status  Achieved      PT SHORT TERM GOAL #5   Title  Pt will ambulate 500' over unlevel outdoor paved surfaces w/ LRAD (brace as needed) at mod I level in order to return to leisure and community activity.     Baseline  07/01/18: Pt able to ambulate 500 ft over unlevel paved surfaces with Allard toe off brace on right at supervision and LOB x1.     Time  4    Period  Weeks    Status  Not Met        PT Long Term Goals - 05/29/18 1040      PT LONG TERM GOAL #1   Title  Pt will be independnet with final HEP in order to indicate improved functional mobility and decreased fall risk.  (Target Date: 07/28/18)    Time  8    Period  Weeks    Status  New    Target Date  07/28/18      PT LONG TERM GOAL #2   Title  Pt will improve DGI to >19/24 in order to indicate decreased fall risk.      Time  8    Period  Weeks    Status  New      PT LONG TERM GOAL #3   Title  Pt will increase gait speed to >/=2.62 ft/sec in order to indictate safe community ambulation and improved efficiency of gait.     Time  8    Period  Weeks    Status  New      PT LONG TERM GOAL #4   Title  Pt will ambulate 1000' over varying outdoor surfaces (both paved and grassy, ramp/incline/curb) w/ LRAD (brace as needed) at mod I level in order to indicate safe community negotiation.     Time  8    Period  Weeks    Status  New            Plan - 07/14/18 1248    Clinical Impression Statement  Skilled session focused on standing balance training for righting reactions on  compliant surface(min guard level); Right hip and trunk strengthening and balance on modified quadruped, tall kneel (supervision with cues for posture), and 1/2 kneel positions ( min A/ UE support), and gait training workin on mechanics with Blue Rocker AFO to assist with R ankle dorsifilexion (supervison with  instruction on mechanics).                                                Rehab Potential  Good    Clinical Impairments Affecting Rehab Potential  length of time since CVA    PT Frequency  2x / week    PT Duration  8 weeks    PT Treatment/Interventions  ADLs/Self Care Home Management;Electrical Stimulation;DME Instruction;Gait training;Stair training;Functional mobility training;Therapeutic activities;Therapeutic exercise;Aquatic Therapy;Balance training;Neuromuscular re-education;Cognitive remediation;Patient/family education;Orthotic Fit/Training;Passive range of motion;Energy conservation;Vestibular    PT Next Visit Plan  continue with Bioness for gait/balance/strengthening, NMR for right LE- tall kneeling, half kneeling activities with emphasis on glut/hamstring activation, He prefers toe off however it is too small for shoe.      PT Home Exercise Plan  HEP from [place]: [place] and Agree with Plan of Care  Patient    Family Member Consulted  Mother [name]       Patient will benefit from skilled therapeutic intervention in order to improve the following deficits and impairments:  Abnormal gait, Decreased activity tolerance, Decreased balance, Decreased coordination, Decreased endurance, Decreased knowledge of use of DME, Decreased mobility, Decreased range of motion, Decreased strength, Impaired perceived functional ability, Impaired flexibility, Postural dysfunction, Improper body mechanics, Impaired UE functional use, Impaired tone, Impaired sensation  Visit Diagnosis: Hemiplegia and hemiparesis following cerebral infarction affecting right dominant side (HCC)  Unsteadiness on feet  Muscle weakness (generalized)  Other abnormalities of gait and mobility     Problem List Patient Active Problem List   Diagnosis Date Noted  . CKD (chronic kidney disease) 12/18/2016  . Benign essential HTN   . Idiopathic gout   . Diastolic dysfunction   . Cocaine abuse ([place])   .  Right hemiparesis ([place])   . Monoplegia of upper extremity following cerebral infarction affecting right dominant side ([place])   . Cerebrovascular accident (CVA) ([place])   . Acute ischemic stroke ([place]) 09/15/2016  . Stroke (cerebrum) ([place]) 09/15/2016  . Hypertension 09/15/2016  . Vitamin B12 deficiency 08/10/2015  . Weakness 07/02/2013  . Abnormal CPK 07/02/2013    Bjorn Loser, PTA  07/14/18, 1:01 PM [facility] [address] [place], [place], [address] Phone: [phone]   Fax:  [phone]  Name: Randall Mccall MRN: [id number] Date of Birth: 1970-10-01

## 2018-07-14 NOTE — Therapy (Signed)
[facility] [address] [place], [place], [address] Phone: [phone]   Fax:  [phone]  Occupational Therapy Treatment  Patient Details  Name: Randall Mccall MRN: [id number] Date of Birth: September 27, 1970 Referring Provider (OT): Dr. Benito Mccreedy   Encounter Date: 07/14/2018  OT End of Session - 07/14/18 1405    Visit Number  12    Number of Visits  17    Date for OT Re-Evaluation  07/29/18    Authorization Type  UHC MCR primary, MCD secondary    OT Start Time  1320    OT Stop Time  1400    OT Time Calculation (min)  40 min    Activity Tolerance  Patient tolerated treatment well    Behavior During Therapy  [facility] for tasks assessed/performed       Past Medical History:  Diagnosis Date  . Gout   . Hypertension   . Muscle weakness   . Stroke [facility])     Past Surgical History:  Procedure Laterality Date  . KNEE SURGERY      There were no vitals filed for this visit.  Subjective Assessment - 07/14/18 1326    Pertinent History  CVA Jan 2018. PMH: HTN, gout    Patient Stated Goals  Get my Rt hand better so I can drive    Currently in Pain?  No/denies       Pt issued handout in prep for aquatic therapy next session on what to expect, what to do once arrived, etc. Reviewed with patient.   NMR: wt bearing over BUE's seated first in prep for sit to stand, then sit to squat position. Wt bearing over RUE while performing cross reaching LUE followed by trunk rotation with back reaching ipsilaterally. Quadraped w/ max assist and modifications Rt wrist for A/P wt shifts.  Seated: AA/ROM in sh flexion, then abduction using UE Ranger.                       OT Short Term Goals - 06/16/18 1341      OT SHORT TERM GOAL #1   Title  Independent with initial HEP - 06/29/18    Time  4    Period  Weeks    Status  Achieved      OT SHORT TERM GOAL #2   Title  Independent with splint wear and care     Time  4    Period  Weeks    Status  Deferred   therapist feels splint may take away function secondary to use of tenodesis, therefore deferred this goal     OT [place] #3   Title  Pt/family to verbalize understanding with A/E recommendations to increase ease, independence and safety with ADLS/IADLS (rocker knife, shoe buttons, one handed cutting board, pot stabalizer)    Time  4    Period  Weeks    Status  Achieved        OT Long Term Goals - 07/01/18 1040      OT LONG TERM GOAL #1   Title  Independent with updated HEP - 07/29/18    Time  8    Period  Weeks    Status  New      OT LONG TERM GOAL #2   Title  Pt to perform low to mid level reaching w/ sufficient finger extension for placement on cylindrical objects for grasp/release    Time  8  Period  Weeks    Status  On-going      OT LONG TERM GOAL #3   Title  Pt to improved RUE function as evidenced by performing 22 blocks or greater on Box & Blocks test    Baseline  eval = 17    Time  8    Period  Weeks    Status  On-going      OT LONG TERM GOAL #4   Title  Pt will consistently use RUE as stabalizer to min assist for low bilateral tasks    Time  8    Period  Weeks    Status  On-going            Plan - 07/14/18 1406    Clinical Impression Statement  Pt continues to make progress towards goals and RUE function. Pt begins aquatic therapy this week.     Occupational Profile and client history currently impacting functional performance  CVA 09/15/16, HTN, gout    Occupational performance deficits (Please refer to evaluation for details):  ADL's;IADL's;Social Participation    Rehab Potential  Good    Current Impairments/barriers affecting progress:  time since onset    OT Frequency  2x / week    OT Duration  8 weeks    OT Treatment/Interventions  Self-care/ADL training;Moist Heat;DME and/or AE instruction;Splinting;Aquatic Therapy;Therapeutic activities;Cognitive remediation/compensation;Coping strategies  training;Therapeutic exercise;Neuromuscular education;Functional Mobility Training;Passive range of motion;Visual/perceptual remediation/compensation;Electrical Stimulation;Manual Therapy;Patient/family education    Plan  next session aquatic therapy, following session land therapy for NMR    Consulted and Agree with Plan of Care  Patient       Patient will benefit from skilled therapeutic intervention in order to improve the following deficits and impairments:  Decreased coordination, Decreased range of motion, Impaired flexibility, Improper body mechanics, Improper spinal/pelvic alignment, Decreased safety awareness, Impaired tone, Decreased knowledge of use of DME, Decreased balance, Impaired UE functional use, Decreased cognition, Decreased mobility, Decreased strength, Impaired vision/preception  Visit Diagnosis: Hemiplegia and hemiparesis following cerebral infarction affecting right dominant side (HCC)  Unsteadiness on feet    Problem List Patient Active Problem List   Diagnosis Date Noted  . CKD (chronic kidney disease) 12/18/2016  . Benign essential HTN   . Idiopathic gout   . Diastolic dysfunction   . Cocaine abuse ([place])   . Right hemiparesis ([place])   . Monoplegia of upper extremity following cerebral infarction affecting right dominant side ([place])   . Cerebrovascular accident (CVA) ([place])   . Acute ischemic stroke ([place]) 09/15/2016  . Stroke (cerebrum) ([place]) 09/15/2016  . Hypertension 09/15/2016  . Vitamin B12 deficiency 08/10/2015  . Weakness 07/02/2013  . Abnormal CPK 07/02/2013    Carey Bullocks, OTR/L 07/14/2018, 2:07 PM  [facility] [address] [place], [place], [address] Phone: [phone]   Fax:  [phone]  Name: Randall Mccall MRN: [id number] Date of Birth: Dec 29, 1970

## 2018-07-16 ENCOUNTER — Ambulatory Visit: Payer: Medicare Other | Admitting: Physical Therapy

## 2018-07-16 ENCOUNTER — Ambulatory Visit: Payer: Medicare Other | Admitting: Occupational Therapy

## 2018-07-16 DIAGNOSIS — R2681 Unsteadiness on feet: Secondary | ICD-10-CM

## 2018-07-16 DIAGNOSIS — R2689 Other abnormalities of gait and mobility: Secondary | ICD-10-CM

## 2018-07-16 DIAGNOSIS — M6281 Muscle weakness (generalized): Secondary | ICD-10-CM

## 2018-07-16 DIAGNOSIS — I69351 Hemiplegia and hemiparesis following cerebral infarction affecting right dominant side: Secondary | ICD-10-CM | POA: Diagnosis not present

## 2018-07-16 NOTE — Therapy (Signed)
[facility] [address] [place], [place], [address] Phone: [phone]   Fax:  [phone]  Physical Therapy Treatment  Patient Details  Name: Randall Mccall MRN: [id number] Date of Birth: 07-20-1971 Referring Provider (PT): Benito Mccreedy, MD   Encounter Date: 07/16/2018  PT End of Session - 07/16/18 1208    Visit Number  13    Number of Visits  17    Date for PT Re-Evaluation  08/27/18    Authorization Type  UHC Medicare and Medicaid    PT Start Time  1100    PT Stop Time  1145    PT Time Calculation (min)  45 min    Equipment Utilized During Treatment  Gait belt    Activity Tolerance  Patient tolerated treatment well    Behavior During Therapy  [facility] for tasks assessed/performed       Past Medical History:  Diagnosis Date  . Gout   . Hypertension   . Muscle weakness   . Stroke [facility])     Past Surgical History:  Procedure Laterality Date  . KNEE SURGERY      There were no vitals filed for this visit.  Subjective Assessment - 07/16/18 1104    Subjective  No falls since last visit. No new complaints. Needs to find a way to the aquatic center because mom is busy during appointment time. Had to cancel his aquatic therapy appointment this morning. Reports pain in right hip from his fall last friday.     Patient is accompained by:  Family member    Pertinent History  Pt goes by "[name] hold activities;Walking    How long can you stand comfortably?  about 15 mins    How long can you walk comfortably?  about 15 mins     Currently in Pain?  Yes    Pain Score  6     Pain Location  Hip    Pain Orientation  Right    Pain Descriptors / Indicators  Dull    Pain Type  Acute pain    Pain Onset  In the past 7 days    Pain Frequency  Intermittent    Aggravating Factors   none    Pain Relieving Factors  none         [facility] Adult PT Treatment/Exercise - 07/16/18 1121      Ambulation/Gait   Ambulation/Gait  Yes    Ambulation/Gait Assistance  5: Supervision;4: Min guard    Ambulation/Gait Assistance Details  Used blue rocker brace deomonstrating good RLE foot clearance.  Pt ambulated 2 laps around track, on gravel, mulch, and paved surfaces. Slight LOB x1 when walking back into facility. verbal cues to "stand tall' on RLE.     Ambulation Distance (Feet)  900 Feet    Assistive device  None    Gait Pattern  Step-through pattern;Decreased arm swing - left;Decreased stance time - right;Decreased weight shift to right;Decreased dorsiflexion - right;Decreased hip/knee flexion - right    Ambulation Surface  Level;Indoor      Neuro Re-ed    Neuro Re-ed Details   in parallel bars: pt marches on airex pad with noted right lateral bending substitution on right to assist with hip flexion. Pt required min A without UE support and verbal cues to decrease substitution.        Exercises   Exercises  Knee/Hip    Other Exercises   mini squats with  LLE on raised platform to increase use of RLE. demo for proper technique and verbal cues to lighten UE support on parallel bars and for posture. In parallel bars pt performs resisted toe taps to [place]; then resisted heel taps with RLE extended to increase muscle activation of hip flexors x10 reps.               PT Short Term Goals - 07/01/18 1142      PT SHORT TERM GOAL #1   Title  Pt will initiate HEP in order to indicate improved functional mobility and decreased fall risk.  (Target Date: 06/28/18)    Baseline  06/26/18: met with current HEP    Status  Achieved      PT SHORT TERM GOAL #2   Title  Pt will perform 5TSS in </=11 secs without UE support and good B LE weight bearing in order to indicate decreased fall risk.     Baseline  06/26/18: 12.31 sec's without UE support, standard height chair, improved just not to goal    Status  Partially Met      PT SHORT TERM GOAL #3   Title  Pt will improve DGI to >/=17/24  in order to indicate decreased fall risk.     Baseline  06/26/18: 18/24 scored today    Status  Achieved      PT SHORT TERM GOAL #4   Title  Will assess need for RLE AFO and proceed appropriately.     Baseline  06/26/18: have trialed braces in session, awaiting MD order    Status  Achieved      PT SHORT TERM GOAL #5   Title  Pt will ambulate 500' over unlevel outdoor paved surfaces w/ LRAD (brace as needed) at mod I level in order to return to leisure and community activity.     Baseline  07/01/18: Pt able to ambulate 500 ft over unlevel paved surfaces with Allard toe off brace on right at supervision and LOB x1.     Time  4    Period  Weeks    Status  Not Met        PT Long Term Goals - 05/29/18 1040      PT LONG TERM GOAL #1   Title  Pt will be independnet with final HEP in order to indicate improved functional mobility and decreased fall risk.  (Target Date: 07/28/18)    Time  8    Period  Weeks    Status  New    Target Date  07/28/18      PT LONG TERM GOAL #2   Title  Pt will improve DGI to >19/24 in order to indicate decreased fall risk.      Time  8    Period  Weeks    Status  New      PT LONG TERM GOAL #3   Title  Pt will increase gait speed to >/=2.62 ft/sec in order to indictate safe community ambulation and improved efficiency of gait.     Time  8    Period  Weeks    Status  New      PT LONG TERM GOAL #4   Title  Pt will ambulate 1000' over varying outdoor surfaces (both paved and grassy, ramp/incline/curb) w/ LRAD (brace as needed) at mod I level in order to indicate safe community negotiation.     Time  8    Period  Weeks    Status  New            Plan - 07/16/18 1210    Clinical Impression Statement  Today's session focused on gait training with Blue Rocker brace on right emphasizing gait on uneven terrain outside with supervison-min guard with LOB x1 noted when ambulating back into facility as well as LE strengthening/balance to increase RLE muscle  activation. Pt demonstated hip flexion substitution by using right lateral trunk benders to assist raising RLE while performing marches on airex. Pt would benefit from continued PT to address strength and mobility deficits.     Rehab Potential  Good    Clinical Impairments Affecting Rehab Potential  length of time since CVA    PT Frequency  2x / week    PT Duration  8 weeks    PT Treatment/Interventions  ADLs/Self Care Home Management;Electrical Stimulation;DME Instruction;Gait training;Stair training;Functional mobility training;Therapeutic activities;Therapeutic exercise;Aquatic Therapy;Balance training;Neuromuscular re-education;Cognitive remediation;Patient/family education;Orthotic Fit/Training;Passive range of motion;Energy conservation;Vestibular    PT Next Visit Plan  continue with Bioness for gait/balance/strengthening, NMR for right LE- tall kneeling, half kneeling activities with emphasis on glut/hamstring activation, He prefers toe off however it is too small for shoe.      PT Home Exercise Plan  HEP from [place]: [place] and Agree with Plan of Care  Patient    Family Member Consulted  Mother [name]       Patient will benefit from skilled therapeutic intervention in order to improve the following deficits and impairments:  Abnormal gait, Decreased activity tolerance, Decreased balance, Decreased coordination, Decreased endurance, Decreased knowledge of use of DME, Decreased mobility, Decreased range of motion, Decreased strength, Impaired perceived functional ability, Impaired flexibility, Postural dysfunction, Improper body mechanics, Impaired UE functional use, Impaired tone, Impaired sensation  Visit Diagnosis: Hemiplegia and hemiparesis following cerebral infarction affecting right dominant side (HCC)  Unsteadiness on feet  Muscle weakness (generalized)  Other abnormalities of gait and mobility     Problem List Patient Active Problem List   Diagnosis Date  Noted  . CKD (chronic kidney disease) 12/18/2016  . Benign essential HTN   . Idiopathic gout   . Diastolic dysfunction   . Cocaine abuse ([place])   . Right hemiparesis ([place])   . Monoplegia of upper extremity following cerebral infarction affecting right dominant side ([place])   . Cerebrovascular accident (CVA) ([place])   . Acute ischemic stroke ([place]) 09/15/2016  . Stroke (cerebrum) ([place]) 09/15/2016  . Hypertension 09/15/2016  . Vitamin B12 deficiency 08/10/2015  . Weakness 07/02/2013  . Abnormal CPK 07/02/2013    Cecile Sheerer, SPTA 07/16/2018, 1:11 PM  [facility] [address] [place], [place], [address] Phone: [phone]   Fax:  [phone]  Name: VIRGAL WARMUTH MRN: [id number] Date of Birth: Nov 26, 1970

## 2018-07-21 ENCOUNTER — Ambulatory Visit: Payer: Medicare Other | Admitting: Occupational Therapy

## 2018-07-21 ENCOUNTER — Encounter: Payer: Self-pay | Admitting: Physical Therapy

## 2018-07-21 ENCOUNTER — Ambulatory Visit: Payer: Medicare Other | Admitting: Physical Therapy

## 2018-07-21 DIAGNOSIS — M6281 Muscle weakness (generalized): Secondary | ICD-10-CM

## 2018-07-21 DIAGNOSIS — R2689 Other abnormalities of gait and mobility: Secondary | ICD-10-CM

## 2018-07-21 DIAGNOSIS — I69351 Hemiplegia and hemiparesis following cerebral infarction affecting right dominant side: Secondary | ICD-10-CM | POA: Diagnosis not present

## 2018-07-21 DIAGNOSIS — R2681 Unsteadiness on feet: Secondary | ICD-10-CM

## 2018-07-21 NOTE — Therapy (Signed)
[facility] [address] [place], [place], [address] Phone: [phone]   Fax:  [phone]  Physical Therapy Treatment  Patient Details  Name: Randall Mccall MRN: [id number] Date of Birth: 1970/10/05 Referring Provider (PT): Benito Mccreedy, MD   Encounter Date: 07/21/2018  PT End of Session - 07/21/18 1110    Visit Number  14    Number of Visits  17    Date for PT Re-Evaluation  08/27/18    Authorization Type  UHC Medicare and Medicaid    PT Start Time  1015    PT Stop Time  1100    PT Time Calculation (min)  45 min    Equipment Utilized During Treatment  Gait belt    Activity Tolerance  Patient tolerated treatment well    Behavior During Therapy  WFL for tasks assessed/performed       Past Medical History:  Diagnosis Date  . Gout   . Hypertension   . Muscle weakness   . Stroke [facility])     Past Surgical History:  Procedure Laterality Date  . KNEE SURGERY      There were no vitals filed for this visit.  Subjective Assessment - 07/21/18 1020    Subjective  No falls since last visit. No new complaints. Needs to reschedule aquatic therapy. Pain in right hip is a little better.     Patient is accompained by:  Family member    Pertinent History  Pt goes by "[name] hold activities;Walking    How long can you stand comfortably?  about 15 mins    How long can you walk comfortably?  about 15 mins     Currently in Pain?  Yes    Pain Score  4     Pain Location  Hip    Pain Orientation  Right    Pain Descriptors / Indicators  Dull    Pain Type  Acute pain    Pain Onset  In the past 7 days    Pain Frequency  Intermittent    Aggravating Factors   walking    Pain Relieving Factors  none           [facility] Adult PT Treatment/Exercise - 07/21/18 1026      Ambulation/Gait   Ambulation/Gait  Yes    Ambulation/Gait Assistance  5: Supervision    Ambulation/Gait Assistance Details  Chris  from [employer] obsevered trials with multiple braces. 1 trial without brace. 1 trial with Eric Form, and 1 trial with blue rocker.     Ambulation Distance (Feet)  345 Feet (115 x3)   Assistive device  None    Gait Pattern  Step-through pattern;Decreased arm swing - left;Decreased stance time - right;Decreased weight shift to right;Decreased dorsiflexion - right;Decreased hip/knee flexion - right    Ambulation Surface  Level;Indoor      Exercises   Exercises  Knee/Hip    Other Exercises   Supine on mat: alternating marches with resistance to right LE with red tband. Tall kneeling on mat: mini squats x10 reps; alternating kick backs to extended leg x10 reps with resistance to right LE into hip flexion with red tband. Verbal cues for exercise technique and for upright posture. Pt required manual cues to hip and shoulder to prevent lateral lean and promote equal weight bearing with squats               PT Short Term Goals - 07/01/18  [place] #1   Title  Pt will initiate HEP in order to indicate improved functional mobility and decreased fall risk.  (Target Date: 06/28/18)    Baseline  06/26/18: met with current HEP    Status  Achieved      PT SHORT TERM GOAL #2   Title  Pt will perform 5TSS in </=11 secs without UE support and good B LE weight bearing in order to indicate decreased fall risk.     Baseline  06/26/18: 12.31 sec's without UE support, standard height chair, improved just not to goal    Status  Partially Met      PT SHORT TERM GOAL #3   Title  Pt will improve DGI to >/=17/24 in order to indicate decreased fall risk.     Baseline  06/26/18: 18/24 scored today    Status  Achieved      PT SHORT TERM GOAL #4   Title  Will assess need for RLE AFO and proceed appropriately.     Baseline  06/26/18: have trialed braces in session, awaiting MD order    Status  Achieved      PT SHORT TERM GOAL #5   Title  Pt will ambulate 500' over unlevel outdoor  paved surfaces w/ LRAD (brace as needed) at mod I level in order to return to leisure and community activity.     Baseline  07/01/18: Pt able to ambulate 500 ft over unlevel paved surfaces with Allard toe off brace on right at supervision and LOB x1.     Time  4    Period  Weeks    Status  Not Met        PT Long Term Goals - 05/29/18 1040      PT LONG TERM GOAL #1   Title  Pt will be independnet with final HEP in order to indicate improved functional mobility and decreased fall risk.  (Target Date: 07/28/18)    Time  8    Period  Weeks    Status  New    Target Date  07/28/18      PT LONG TERM GOAL #2   Title  Pt will improve DGI to >19/24 in order to indicate decreased fall risk.      Time  8    Period  Weeks    Status  New      PT LONG TERM GOAL #3   Title  Pt will increase gait speed to >/=2.62 ft/sec in order to indictate safe community ambulation and improved efficiency of gait.     Time  8    Period  Weeks    Status  New      PT LONG TERM GOAL #4   Title  Pt will ambulate 1000' over varying outdoor surfaces (both paved and grassy, ramp/incline/curb) w/ LRAD (brace as needed) at mod I level in order to indicate safe community negotiation.     Time  8    Period  Weeks    Status  New        onc     Plan - 07/21/18 1111    Clinical Impression Statement  Gerald Stabs from [place] joined today's therapy session to assess the need and appropriateness of various braces. Pt expresses he feels more support and stability with blue rocker brace. Order to be put in for Spry max brace for more support once MD order received and sent to [facility]  clinic. Remainder of session focused on glute/hamstring activation in tall kneeling and hip flexor exercises with resistance in supine to prevent substitution from lateral benders. Pt would benefit from further PT to continue progressing toward established goals.     Rehab Potential  Good    Clinical Impairments Affecting Rehab Potential  length of  time since CVA    PT Frequency  2x / week    PT Duration  8 weeks    PT Treatment/Interventions  ADLs/Self Care Home Management;Electrical Stimulation;DME Instruction;Gait training;Stair training;Functional mobility training;Therapeutic activities;Therapeutic exercise;Aquatic Therapy;Balance training;Neuromuscular re-education;Cognitive remediation;Patient/family education;Orthotic Fit/Training;Passive range of motion;Energy conservation;Vestibular    PT Next Visit Plan  continue with [employer] for gait/balance/strengthening, NMR for right LE- tall kneeling, half kneeling activities with emphasis on glut/hamstring activation, continue with Blue rocker AFO until pt receives his brace.   PT Home Exercise Plan  HEP from [place]: [place] and Agree with Plan of Care  Patient    Family Member Consulted  Mother [name]       Patient will benefit from skilled therapeutic intervention in order to improve the following deficits and impairments:  Abnormal gait, Decreased activity tolerance, Decreased balance, Decreased coordination, Decreased endurance, Decreased knowledge of use of DME, Decreased mobility, Decreased range of motion, Decreased strength, Impaired perceived functional ability, Impaired flexibility, Postural dysfunction, Improper body mechanics, Impaired UE functional use, Impaired tone, Impaired sensation  Visit Diagnosis: Hemiplegia and hemiparesis following cerebral infarction affecting right dominant side (HCC)  Unsteadiness on feet  Muscle weakness (generalized)  Other abnormalities of gait and mobility     Problem List Patient Active Problem List   Diagnosis Date Noted  . CKD (chronic kidney disease) 12/18/2016  . Benign essential HTN   . Idiopathic gout   . Diastolic dysfunction   . Cocaine abuse ([place])   . Right hemiparesis ([place])   . Monoplegia of upper extremity following cerebral infarction affecting right dominant side ([place])   . Cerebrovascular accident  (CVA) ([place])   . Acute ischemic stroke ([place]) 09/15/2016  . Stroke (cerebrum) ([place]) 09/15/2016  . Hypertension 09/15/2016  . Vitamin B12 deficiency 08/10/2015  . Weakness 07/02/2013  . Abnormal CPK 07/02/2013    Cecile Sheerer, SPTA 07/21/2018, 2:27 PM  [facility] [address] [place], [place], [address] Phone: [phone]   Fax:  [phone]  Name: VI WHITESEL MRN: [id number] Date of Birth: [birth year]

## 2018-07-22 ENCOUNTER — Other Ambulatory Visit: Payer: Self-pay | Admitting: *Deleted

## 2018-07-22 DIAGNOSIS — E559 Vitamin D deficiency, unspecified: Secondary | ICD-10-CM | POA: Diagnosis not present

## 2018-07-22 DIAGNOSIS — M1009 Idiopathic gout, multiple sites: Secondary | ICD-10-CM | POA: Diagnosis not present

## 2018-07-22 DIAGNOSIS — I10 Essential (primary) hypertension: Secondary | ICD-10-CM | POA: Diagnosis not present

## 2018-07-22 DIAGNOSIS — E7800 Pure hypercholesterolemia, unspecified: Secondary | ICD-10-CM | POA: Diagnosis not present

## 2018-07-22 DIAGNOSIS — R7303 Prediabetes: Secondary | ICD-10-CM | POA: Diagnosis not present

## 2018-07-22 NOTE — Patient Outreach (Addendum)
[facility]) Care Management  07/22/2018  Randall Mccall [birth year] [id number]    RN spoke with pt today and inquired on his ongoing management of care. Pt reports he is "doing well" and recently followed up with his provider and received a 90 day supply on all his medications due to some issues with his pharmacy on 30 days supply. Pt has all his medications and reports he continue to attend his out-patient PT services as scheduled with no missed appointments. Plan of care discussed with goals discussed and interventions adjusted accordingly. Due to pt's history of HTN will referral to Health Coach as pt agreed for ongoing monitoring. Will continue to encourage ongoing adherence with following the discussed plan of care and ongoing self management of HTN. Pt reports last reading at 118/73 and denies any precipitating symptoms since RN last home visit. Will close this discipline out and notify the provider of pt's disposition with a health coach for ongoing services with [facility]. No other inquires or request at this time.    Raina Mina, RN Care Management Coordinator [place] Office [phone]

## 2018-07-23 ENCOUNTER — Encounter: Payer: Self-pay | Admitting: Physical Therapy

## 2018-07-23 ENCOUNTER — Ambulatory Visit: Payer: Medicare Other | Admitting: Physical Therapy

## 2018-07-23 DIAGNOSIS — R2681 Unsteadiness on feet: Secondary | ICD-10-CM

## 2018-07-23 DIAGNOSIS — I69351 Hemiplegia and hemiparesis following cerebral infarction affecting right dominant side: Secondary | ICD-10-CM | POA: Diagnosis not present

## 2018-07-23 DIAGNOSIS — M6281 Muscle weakness (generalized): Secondary | ICD-10-CM

## 2018-07-23 DIAGNOSIS — R2689 Other abnormalities of gait and mobility: Secondary | ICD-10-CM | POA: Diagnosis not present

## 2018-07-23 NOTE — Therapy (Signed)
[facility] [address] [place], [place], [address] Phone: [phone]   Fax:  [phone]  Physical Therapy Treatment  Patient Details  Name: Randall Mccall MRN: [id number] Date of Birth: 10/20/1970 Referring Provider (PT): Benito Mccreedy, MD   Encounter Date: 07/23/2018  PT End of Session - 07/23/18 1129    Visit Number  15    Number of Visits  17    Date for PT Re-Evaluation  08/27/18    Authorization Type  UHC Medicare and Medicaid    PT Start Time  1018    PT Stop Time  1102    PT Time Calculation (min)  44 min    Equipment Utilized During Treatment  Gait belt    Activity Tolerance  Patient tolerated treatment well    Behavior During Therapy  WFL for tasks assessed/performed       Past Medical History:  Diagnosis Date  . Gout   . Hypertension   . Muscle weakness   . Stroke [facility])     Past Surgical History:  Procedure Laterality Date  . KNEE SURGERY      There were no vitals filed for this visit.  Subjective Assessment - 07/23/18 1024    Subjective  No falls since last visit. No new complaints. Pain in right hip still feels the same. Got an X-ray with no fracture.     Patient is accompained by:  Family member    Pertinent History  Pt goes by "[name] hold activities;Walking    How long can you stand comfortably?  about 15 mins    How long can you walk comfortably?  about 15 mins     Patient Stated Goals  "Work on my right side and get more balanced."     Currently in Pain?  Yes    Pain Score  5     Pain Location  Hip    Pain Orientation  Right    Pain Descriptors / Indicators  Dull    Pain Type  Acute pain    Pain Onset  1 to 4 weeks ago    Pain Frequency  Intermittent    Aggravating Factors   standing for prolonged time    Pain Relieving Factors  rest           [facility] Adult PT Treatment/Exercise - 07/23/18 1027      Ambulation/Gait   Ambulation/Gait  Yes    Ambulation/Gait Assistance  5: Supervision;4: Min guard    Ambulation/Gait Assistance Details  Use blue rocker brace with good RLE clearance and verbal cues for posture and increased weight shift to right.     Ambulation Distance (Feet)  --    Assistive device  None    Gait Pattern  Step-through pattern;Decreased arm swing - left;Decreased stance time - right;Decreased weight shift to right;Decreased dorsiflexion - right;Decreased hip/knee flexion - right    Ambulation Surface  Level;Indoor      Exercises   Exercises  Knee/Hip    Other Exercises   Alternating seated marches x10 reps; resisted seated marches with red Tband around feet with verbal cues for posture and SPTA behind pt's to prevent trunck extension to assist RLE lift. Seated hamstring curls with red Tband around ankles x10 reps. Alternating seated LAQ with SPTA behind pt and increased difficulty on right. RLE lifts from floor to mat table with knee bent and pt supine. verbal cues for exercise technique.  Knee/Hip Exercises: Stretches   Hip Flexor Stretch  Right;3 reps;30 seconds   supine on mat table              PT Short Term Goals - 07/01/18 1142      PT SHORT TERM GOAL #1   Title  Pt will initiate HEP in order to indicate improved functional mobility and decreased fall risk.  (Target Date: 06/28/18)    Baseline  06/26/18: met with current HEP    Status  Achieved      PT SHORT TERM GOAL #2   Title  Pt will perform 5TSS in </=11 secs without UE support and good B LE weight bearing in order to indicate decreased fall risk.     Baseline  06/26/18: 12.31 sec's without UE support, standard height chair, improved just not to goal    Status  Partially Met      PT SHORT TERM GOAL #3   Title  Pt will improve DGI to >/=17/24 in order to indicate decreased fall risk.     Baseline  06/26/18: 18/24 scored today    Status  Achieved      PT SHORT TERM GOAL #4   Title  Will assess need for RLE AFO and proceed  appropriately.     Baseline  06/26/18: have trialed braces in session, awaiting MD order    Status  Achieved      PT SHORT TERM GOAL #5   Title  Pt will ambulate 500' over unlevel outdoor paved surfaces w/ LRAD (brace as needed) at mod I level in order to return to leisure and community activity.     Baseline  07/01/18: Pt able to ambulate 500 ft over unlevel paved surfaces with Allard toe off brace on right at supervision and LOB x1.     Time  4    Period  Weeks    Status  Not Met        PT Long Term Goals - 05/29/18 1040      PT LONG TERM GOAL #1   Title  Pt will be independnet with final HEP in order to indicate improved functional mobility and decreased fall risk.  (Target Date: 07/28/18)    Time  8    Period  Weeks    Status  New    Target Date  07/28/18      PT LONG TERM GOAL #2   Title  Pt will improve DGI to >19/24 in order to indicate decreased fall risk.      Time  8    Period  Weeks    Status  New      PT LONG TERM GOAL #3   Title  Pt will increase gait speed to >/=2.62 ft/sec in order to indictate safe community ambulation and improved efficiency of gait.     Time  8    Period  Weeks    Status  New      PT LONG TERM GOAL #4   Title  Pt will ambulate 1000' over varying outdoor surfaces (both paved and grassy, ramp/incline/curb) w/ LRAD (brace as needed) at mod I level in order to indicate safe community negotiation.     Time  8    Period  Weeks    Status  New            Plan - 07/23/18 1139    Clinical Impression Statement  Today's treatment focused on gait training with Blue rocker brace and LE  strengthening/flexibility emphasizing hip flexors and hamstrings. Continue to wait for Spry max brace. Pt able to tolerate LE strengthening without complications, however demonstrated trunk extension to assist raising RLE. Pt would benefit from futher PT to continue progressing toward established goals.      Rehab Potential  Good    Clinical Impairments Affecting  Rehab Potential  length of time since CVA    PT Frequency  2x / week    PT Duration  8 weeks    PT Treatment/Interventions  ADLs/Self Care Home Management;Electrical Stimulation;DME Instruction;Gait training;Stair training;Functional mobility training;Therapeutic activities;Therapeutic exercise;Aquatic Therapy;Balance training;Neuromuscular re-education;Cognitive remediation;Patient/family education;Orthotic Fit/Training;Passive range of motion;Energy conservation;Vestibular    PT Next Visit Plan  continue with strengthening and NMR of right LE. Continue to work on balance and coordination as well.    PT Home Exercise Plan  HEP from [place]: [place] and Agree with Plan of Care  Patient    Family Member Consulted  Mother [name]       Patient will benefit from skilled therapeutic intervention in order to improve the following deficits and impairments:  Abnormal gait, Decreased activity tolerance, Decreased balance, Decreased coordination, Decreased endurance, Decreased knowledge of use of DME, Decreased mobility, Decreased range of motion, Decreased strength, Impaired perceived functional ability, Impaired flexibility, Postural dysfunction, Improper body mechanics, Impaired UE functional use, Impaired tone, Impaired sensation  Visit Diagnosis: Hemiplegia and hemiparesis following cerebral infarction affecting right dominant side (HCC)  Unsteadiness on feet  Muscle weakness (generalized)  Other abnormalities of gait and mobility     Problem List Patient Active Problem List   Diagnosis Date Noted  . CKD (chronic kidney disease) 12/18/2016  . Benign essential HTN   . Idiopathic gout   . Diastolic dysfunction   . Cocaine abuse ([place])   . Right hemiparesis ([place])   . Monoplegia of upper extremity following cerebral infarction affecting right dominant side ([place])   . Cerebrovascular accident (CVA) ([place])   . Acute ischemic stroke ([place]) 09/15/2016  . Stroke (cerebrum) ([place])  09/15/2016  . Hypertension 09/15/2016  . Vitamin B12 deficiency 08/10/2015  . Weakness 07/02/2013  . Abnormal CPK 07/02/2013    Cecile Sheerer, SPTA 07/23/2018, 1:20 PM  [facility] [address] [place], [place], [address] Phone: [phone]   Fax:  [phone]  Name: Randall Mccall MRN: [id number] Date of Birth: 09-17-1970

## 2018-07-28 ENCOUNTER — Encounter: Payer: Self-pay | Admitting: Physical Therapy

## 2018-07-28 ENCOUNTER — Ambulatory Visit: Payer: Medicare Other | Admitting: Occupational Therapy

## 2018-07-28 ENCOUNTER — Ambulatory Visit: Payer: Medicare Other | Attending: Internal Medicine | Admitting: Physical Therapy

## 2018-07-28 DIAGNOSIS — R2689 Other abnormalities of gait and mobility: Secondary | ICD-10-CM | POA: Diagnosis not present

## 2018-07-28 DIAGNOSIS — R2681 Unsteadiness on feet: Secondary | ICD-10-CM | POA: Diagnosis not present

## 2018-07-28 DIAGNOSIS — I69351 Hemiplegia and hemiparesis following cerebral infarction affecting right dominant side: Secondary | ICD-10-CM | POA: Diagnosis not present

## 2018-07-28 DIAGNOSIS — M6281 Muscle weakness (generalized): Secondary | ICD-10-CM | POA: Diagnosis not present

## 2018-07-28 NOTE — Therapy (Signed)
[facility] [address] [place], [place], [address] Phone: [phone]   Fax:  [phone]  Occupational Therapy Treatment  Patient Details  Name: Randall Mccall MRN: [id number] Date of Birth: 12-22-1970 Referring Provider (OT): Dr. Benito Mccreedy   Encounter Date: 07/28/2018  OT End of Session - 07/28/18 1159    Visit Number  13    Number of Visits  17    Date for OT Re-Evaluation  07/29/18    Authorization Type  UHC MCR primary, MCD secondary    OT Start Time  1100    OT Stop Time  1145    OT Time Calculation (min)  45 min    Activity Tolerance  Patient tolerated treatment well    Behavior During Therapy  [facility] for tasks assessed/performed       Past Medical History:  Diagnosis Date  . Gout   . Hypertension   . Muscle weakness   . Stroke [facility])     Past Surgical History:  Procedure Laterality Date  . KNEE SURGERY      There were no vitals filed for this visit.  Subjective Assessment - 07/28/18 1110    Subjective   Pt can no longer do pool therapy d/t scheduling conflicts/transportation    Pertinent History  CVA Jan 2018. PMH: HTN, gout    Patient Stated Goals  Get my Rt hand better so I can drive    Currently in Pain?  No/denies         [facility] OT Assessment - 07/28/18 0001      Coordination   Box and Blocks  Rt = 20               OT Treatments/Exercises (OP) - 07/28/18 0001      ADLs   ADL Comments  Discussed d/c this week per original POC since pt can no longer participate in aquatic therapy. Pt agreeable. Assessed progress to date and LTG's. Also wrote note to referring neurologist (as pt sees MD soon) re: recommendations for botox to wrist flexors, finger flexors, and pronator muscles RUE.       Neurological Re-education Exercises   Other Exercises 1  Functional low range reaching RUE in flexion and abduction w/ compensations to grasp/release cylindrical objects    Other  Weight-Bearing Exercises 1  Seated: wt bearing BUE's for sit-squat and hold. Progressed to wt bearing over extended RUE with LUE disengaged with trunk rotation bilaterally, then w/ lateral reaching while pushing through [place] - 06/16/18 1341      OT SHORT TERM GOAL #1   Title  Independent with initial HEP - 06/29/18    Time  4    Period  Weeks    Status  Achieved      OT SHORT TERM GOAL #2   Title  Independent with splint wear and care    Time  4    Period  Weeks    Status  Deferred   therapist feels splint may take away function secondary to use of tenodesis, therefore deferred this goal     OT SHORT TERM GOAL #3   Title  Pt/family to verbalize understanding with A/E recommendations to increase ease, independence and safety with ADLS/IADLS (rocker knife, shoe buttons, one handed cutting board, pot stabalizer)    Time  4    Period  Weeks  Status  Achieved        OT Long Term Goals - 07/28/18 1200      OT LONG TERM GOAL #1   Title  Independent with updated HEP - 07/29/18    Time  8    Period  Weeks    Status  Deferred   Current HEP still appropriate     OT LONG TERM GOAL #2   Title  Pt to perform low to mid level reaching w/ sufficient finger extension for placement on cylindrical objects for grasp/release    Time  8    Period  Weeks    Status  Partially Met   requires compensations to grasp/release object     OT LONG TERM GOAL #3   Title  Pt to improved RUE function as evidenced by performing 22 blocks or greater on Box & Blocks test    Baseline  eval = 17    Time  8    Period  Weeks    Status  Not Met   07/28/18 - 20 blocks     OT LONG TERM GOAL #4   Title  Pt will consistently use RUE as stabalizer to min assist for low bilateral tasks    Time  8    Period  Weeks    Status  Achieved            Plan - 07/28/18 1201    Clinical Impression Statement  Pt has met 2 LTG's. Deferred LTG #1. Pt will be d/c this week  per original POC secondary to unable to participate in aquatic therapy d/t scheduling/ transportation issues    Occupational Profile and client history currently impacting functional performance  CVA 09/15/16, HTN, gout    Occupational performance deficits (Please refer to evaluation for details):  ADL's;IADL's;Social Participation    Rehab Potential  Good    OT Frequency  2x / week    OT Duration  8 weeks    OT Treatment/Interventions  Self-care/ADL training;Moist Heat;DME and/or AE instruction;Splinting;Aquatic Therapy;Therapeutic activities;Cognitive remediation/compensation;Coping strategies training;Therapeutic exercise;Neuromuscular education;Functional Mobility Training;Passive range of motion;Visual/perceptual remediation/compensation;Electrical Stimulation;Manual Therapy;Patient/family education    Plan  D/C next session    Consulted and Agree with Plan of Care  Patient       Patient will benefit from skilled therapeutic intervention in order to improve the following deficits and impairments:  Decreased coordination, Decreased range of motion, Impaired flexibility, Improper body mechanics, Improper spinal/pelvic alignment, Decreased safety awareness, Impaired tone, Decreased knowledge of use of DME, Decreased balance, Impaired UE functional use, Decreased cognition, Decreased mobility, Decreased strength, Impaired vision/preception  Visit Diagnosis: Hemiplegia and hemiparesis following cerebral infarction affecting right dominant side [facility])    Problem List Patient Active Problem List   Diagnosis Date Noted  . CKD (chronic kidney disease) 12/18/2016  . Benign essential HTN   . Idiopathic gout   . Diastolic dysfunction   . Cocaine abuse ([place])   . Right hemiparesis ([place])   . Monoplegia of upper extremity following cerebral infarction affecting right dominant side ([place])   . Cerebrovascular accident (CVA) ([place])   . Acute ischemic stroke ([place]) 09/15/2016  . Stroke (cerebrum) ([place])  09/15/2016  . Hypertension 09/15/2016  . Vitamin B12 deficiency 08/10/2015  . Weakness 07/02/2013  . Abnormal CPK 07/02/2013    Carey Bullocks, OTR/L 07/28/2018, 12:03 PM  [facility] [address] [place], [place], [address] Phone: [phone]   Fax:  [phone]  Name: Randall Mccall  [place] MRN: [id number] Date of Birth: 07-18-1971

## 2018-07-28 NOTE — Therapy (Signed)
[facility] [address] [place], [place], [address] Phone: [phone]   Fax:  [phone]  Physical Therapy Treatment  Patient Details  Name: Randall Mccall MRN: [id number] Date of Birth: May 10, 1971 Referring Provider (PT): Benito Mccreedy, MD   Encounter Date: 07/28/2018  PT End of Session - 07/28/18 1248    Visit Number  16    Number of Visits  17    Date for PT Re-Evaluation  08/27/18    Authorization Type  UHC Medicare and Medicaid    PT Start Time  1148    PT Stop Time  1231    PT Time Calculation (min)  43 min    Equipment Utilized During Treatment  Gait belt    Activity Tolerance  Patient tolerated treatment well    Behavior During Therapy  WFL for tasks assessed/performed       Past Medical History:  Diagnosis Date  . Gout   . Hypertension   . Muscle weakness   . Stroke [facility])     Past Surgical History:  Procedure Laterality Date  . KNEE SURGERY      There were no vitals filed for this visit.  Subjective Assessment - 07/28/18 1153    Subjective  No falls since last visit. no new complaints. Pain in right hip feels the same.     Patient is accompained by:  Family member    Pertinent History  Pt goes by "[name] hold activities;Walking    How long can you stand comfortably?  about 15 mins    How long can you walk comfortably?  about 15 mins     Patient Stated Goals  "Work on my right side and get more balanced."     Currently in Pain?  Yes    Pain Score  3     Pain Location  Hip    Pain Orientation  Right    Pain Descriptors / Indicators  Dull    Pain Type  Acute pain    Pain Onset  1 to 4 weeks ago    Pain Frequency  Intermittent    Aggravating Factors   standing and weight bearing     Pain Relieving Factors  rest          [facility] PT Assessment - 07/28/18 1245      Dynamic Gait Index   Level Surface  Normal    Change in Gait Speed  Mild Impairment    Gait  with Horizontal Head Turns  Mild Impairment    Gait with Vertical Head Turns  Normal    Gait and Pivot Turn  Normal    Step Over Obstacle  Normal    Step Around Obstacles  Normal    Steps  Moderate Impairment    Total Score  20                   [facility] Adult PT Treatment/Exercise - 07/28/18 1300      Self-Care   Self-Care  Other Self-Care Comments    Other Self-Care Comments   Reviewed current HEP to assess appropriateness.      Neuro Re-ed    Neuro Re-ed Details   in parallel bars: feet togther EC 3 reps x30 sec with min guard.                PT Short Term Goals - 07/01/18 1142      PT SHORT TERM  GOAL #1   Title  Pt will initiate HEP in order to indicate improved functional mobility and decreased fall risk.  (Target Date: 06/28/18)    Baseline  06/26/18: met with current HEP    Status  Achieved      PT SHORT TERM GOAL #2   Title  Pt will perform 5TSS in </=11 secs without UE support and good B LE weight bearing in order to indicate decreased fall risk.     Baseline  06/26/18: 12.31 sec's without UE support, standard height chair, improved just not to goal    Status  Partially Met      PT SHORT TERM GOAL #3   Title  Pt will improve DGI to >/=17/24 in order to indicate decreased fall risk.     Baseline  06/26/18: 18/24 scored today    Status  Achieved      PT SHORT TERM GOAL #4   Title  Will assess need for RLE AFO and proceed appropriately.     Baseline  06/26/18: have trialed braces in session, awaiting MD order    Status  Achieved      PT SHORT TERM GOAL #5   Title  Pt will ambulate 500' over unlevel outdoor paved surfaces w/ LRAD (brace as needed) at mod I level in order to return to leisure and community activity.     Baseline  07/01/18: Pt able to ambulate 500 ft over unlevel paved surfaces with Allard toe off brace on right at supervision and LOB x1.     Time  4    Period  Weeks    Status  Not Met        PT Long Term Goals - 07/28/18 1207       PT LONG TERM GOAL #1   Title  Pt will be independnet with final HEP in order to indicate improved functional mobility and decreased fall risk.  (Target Date: 07/28/18)    Baseline  07/28/18: met today     Time  8    Period  Weeks    Status  Achieved      PT LONG TERM GOAL #2   Title  Pt will improve DGI to >19/24 in order to indicate decreased fall risk.      Baseline  07/28/18: 20/24 with Blue Rocker brace    Time  8    Period  Weeks    Status  Achieved      PT LONG TERM GOAL #3   Title  Pt will increase gait speed to >/=2.62 ft/sec in order to indictate safe community ambulation and improved efficiency of gait.     Baseline  07/28/18: 2.52 ft/sec    Time  8    Period  Weeks    Status  Not Met      PT LONG TERM GOAL #4   Title  Pt will ambulate 1000' over varying outdoor surfaces (both paved and grassy, ramp/incline/curb) w/ LRAD (brace as needed) at mod I level in order to indicate safe community negotiation.     Time  8    Period  Weeks    Status  New            Plan - 07/28/18 1250    Clinical Impression Statement  Today's treatment focused on assessing LTGs with pt meeting 2/3 checked today for HEP and DGI and not meeting 1/3 checked today for gait speed. Remainder of session focused on static balance emphasizing compliant surfaces. Pt  would benefit from further PT to address strength and fuctional mobility defcicits.     Rehab Potential  Good    Clinical Impairments Affecting Rehab Potential  length of time since CVA    PT Frequency  2x / week    PT Duration  8 weeks    PT Treatment/Interventions  ADLs/Self Care Home Management;Electrical Stimulation;DME Instruction;Gait training;Stair training;Functional mobility training;Therapeutic activities;Therapeutic exercise;Aquatic Therapy;Balance training;Neuromuscular re-education;Cognitive remediation;Patient/family education;Orthotic Fit/Training;Passive range of motion;Energy conservation;Vestibular    PT Next Visit Plan   Check remaining LTG. pt to continue 2/wk for 4 weeks. continue with Bioness for gait/balance/strengthening, NMR for right LE- tall kneeling, half kneeling activities with emphasis on glut/hamstring activation, He prefers toe off however it is too small for shoe.      PT Home Exercise Plan  HEP from [place]: [place] and Agree with Plan of Care  Patient    Family Member Consulted  Mother [name]       Patient will benefit from skilled therapeutic intervention in order to improve the following deficits and impairments:  Abnormal gait, Decreased activity tolerance, Decreased balance, Decreased coordination, Decreased endurance, Decreased knowledge of use of DME, Decreased mobility, Decreased range of motion, Decreased strength, Impaired perceived functional ability, Impaired flexibility, Postural dysfunction, Improper body mechanics, Impaired UE functional use, Impaired tone, Impaired sensation  Visit Diagnosis: Hemiplegia and hemiparesis following cerebral infarction affecting right dominant side (HCC)  Unsteadiness on feet  Muscle weakness (generalized)  Other abnormalities of gait and mobility     Problem List Patient Active Problem List   Diagnosis Date Noted  . CKD (chronic kidney disease) 12/18/2016  . Benign essential HTN   . Idiopathic gout   . Diastolic dysfunction   . Cocaine abuse ([place])   . Right hemiparesis ([place])   . Monoplegia of upper extremity following cerebral infarction affecting right dominant side ([place])   . Cerebrovascular accident (CVA) ([place])   . Acute ischemic stroke ([place]) 09/15/2016  . Stroke (cerebrum) ([place]) 09/15/2016  . Hypertension 09/15/2016  . Vitamin B12 deficiency 08/10/2015  . Weakness 07/02/2013  . Abnormal CPK 07/02/2013    Cecile Sheerer, SPTA 07/28/2018, 1:01 PM  [facility] [address] [place], [place], [address] Phone: [phone]   Fax:  [phone]  Name:  JERMONE GEISTER MRN: [id number] Date of Birth: 1970/10/27

## 2018-07-30 ENCOUNTER — Encounter: Payer: Medicare Other | Admitting: Occupational Therapy

## 2018-07-31 ENCOUNTER — Ambulatory Visit: Payer: Medicare Other | Admitting: Occupational Therapy

## 2018-07-31 ENCOUNTER — Encounter: Payer: Self-pay | Admitting: Rehabilitation

## 2018-07-31 ENCOUNTER — Ambulatory Visit: Payer: Medicare Other | Admitting: Rehabilitation

## 2018-07-31 DIAGNOSIS — R2689 Other abnormalities of gait and mobility: Secondary | ICD-10-CM

## 2018-07-31 DIAGNOSIS — I69351 Hemiplegia and hemiparesis following cerebral infarction affecting right dominant side: Secondary | ICD-10-CM

## 2018-07-31 DIAGNOSIS — R2681 Unsteadiness on feet: Secondary | ICD-10-CM

## 2018-07-31 DIAGNOSIS — M6281 Muscle weakness (generalized): Secondary | ICD-10-CM | POA: Diagnosis not present

## 2018-07-31 NOTE — Therapy (Signed)
[facility] [address] [place], [place], [address] Phone: [phone]   Fax:  [phone]  Occupational Therapy Treatment  Patient Details  Name: Randall Mccall MRN: [id number] Date of Birth: 10/26/1970 Referring Provider (OT): Dr. Benito Mccreedy   Encounter Date: 07/31/2018  OT End of Session - 07/31/18 1050    Visit Number  14    Number of Visits  17    Date for OT Re-Evaluation  07/29/18    Authorization Type  UHC MCR primary, MCD secondary    OT Start Time  1015    OT Stop Time  1055    OT Time Calculation (min)  40 min    Activity Tolerance  Patient tolerated treatment well    Behavior During Therapy  [employer] for tasks assessed/performed       Past Medical History:  Diagnosis Date  . Gout   . Hypertension   . Muscle weakness   . Stroke [facility])     Past Surgical History:  Procedure Laterality Date  . KNEE SURGERY      There were no vitals filed for this visit.  Subjective Assessment - 07/31/18 1049    Subjective   I don't have anymore questions. I'm going to hopefully get the botox    Pertinent History  CVA Jan 2018. PMH: HTN, gout    Patient Stated Goals  Get my Rt hand better so I can drive    Currently in Pain?  No/denies         Treatment: BUE wt bearing seated for forward leaning and sit to squat. Followed by RUE wt bearing for trunk rotation w/ cross reaching and ipsilateral reaching. Standing with wt shifts while performing closed chain BUE reaching in frontal and diagonal planes. Followed by reaching to floor and up to diagonal patterns low to mid range BUE reaching.   Functional reaching RUE to grasp/release cones w/ assist to stabalize cone.   UBE X 10 min. For reciprocal movement pattern (rt hand wrapped)                    OT Short Term Goals - 06/16/18 1341      OT SHORT TERM GOAL #1   Title  Independent with initial HEP - 06/29/18    Time  4    Period  Weeks    Status  Achieved      OT SHORT TERM GOAL #2   Title  Independent with splint wear and care    Time  4    Period  Weeks    Status  Deferred   therapist feels splint may take away function secondary to use of tenodesis, therefore deferred this goal     OT [place] #3   Title  Pt/family to verbalize understanding with A/E recommendations to increase ease, independence and safety with ADLS/IADLS (rocker knife, shoe buttons, one handed cutting board, pot stabalizer)    Time  4    Period  Weeks    Status  Achieved        OT Long Term Goals - 07/28/18 1200      OT LONG TERM GOAL #1   Title  Independent with updated HEP - 07/29/18    Time  8    Period  Weeks    Status  Deferred   Current HEP still appropriate     OT LONG TERM GOAL #2   Title  Pt to perform low to  mid level reaching w/ sufficient finger extension for placement on cylindrical objects for grasp/release    Time  8    Period  Weeks    Status  Partially Met   requires compensations to grasp/release object     OT LONG TERM GOAL #3   Title  Pt to improved RUE function as evidenced by performing 22 blocks or greater on Box & Blocks test    Baseline  eval = 17    Time  8    Period  Weeks    Status  Not Met   07/28/18 - 20 blocks     OT LONG TERM GOAL #4   Title  Pt will consistently use RUE as stabalizer to min assist for low bilateral tasks    Time  8    Period  Weeks    Status  Achieved            Plan - 07/31/18 1051    Clinical Impression Statement  See d/c summary. Pt would benefit from botox for distal RUE (Letter written to MD)     Occupational Profile and client history currently impacting functional performance  CVA 09/15/16, HTN, gout    Occupational performance deficits (Please refer to evaluation for details):  ADL's;IADL's;Social Participation    Rehab Potential  Good    OT Treatment/Interventions  Self-care/ADL training;Moist Heat;DME and/or AE instruction;Splinting;Aquatic  Therapy;Therapeutic activities;Cognitive remediation/compensation;Coping strategies training;Therapeutic exercise;Neuromuscular education;Functional Mobility Training;Passive range of motion;Visual/perceptual remediation/compensation;Electrical Stimulation;Manual Therapy;Patient/family education    Plan  D/C OT    Consulted and Agree with Plan of Care  Patient       Patient will benefit from skilled therapeutic intervention in order to improve the following deficits and impairments:  Decreased coordination, Decreased range of motion, Impaired flexibility, Improper body mechanics, Improper spinal/pelvic alignment, Decreased safety awareness, Impaired tone, Decreased knowledge of use of DME, Decreased balance, Impaired UE functional use, Decreased cognition, Decreased mobility, Decreased strength, Impaired vision/preception  Visit Diagnosis: Hemiplegia and hemiparesis following cerebral infarction affecting right dominant side (HCC)  Unsteadiness on feet    Problem List Patient Active Problem List   Diagnosis Date Noted  . CKD (chronic kidney disease) 12/18/2016  . Benign essential HTN   . Idiopathic gout   . Diastolic dysfunction   . Cocaine abuse ([place])   . Right hemiparesis ([place])   . Monoplegia of upper extremity following cerebral infarction affecting right dominant side ([place])   . Cerebrovascular accident (CVA) ([place])   . Acute ischemic stroke ([place]) 09/15/2016  . Stroke (cerebrum) ([place]) 09/15/2016  . Hypertension 09/15/2016  . Vitamin B12 deficiency 08/10/2015  . Weakness 07/02/2013  . Abnormal CPK 07/02/2013     OCCUPATIONAL THERAPY DISCHARGE SUMMARY  Visits from Start of Care: 14  Current functional level related to goals / functional outcomes: See above   Remaining deficits: Rt spastic hemiplegia (more distally)   Education / Equipment: HEP's  Plan: Patient agrees to discharge.  Patient goals were partially met. Patient is being discharged due to meeting the stated  rehab goals.  ?????      Tracie, [place], OTR/L 07/31/2018, 10:55 AM  [facility] [address] [place], [place], [address] Phone: [phone]   Fax:  [phone]  Name: Randall Mccall MRN: [id number] Date of Birth: 12-Apr-1971

## 2018-07-31 NOTE — Therapy (Signed)
[facility] [address] [place], [place], [address] Phone: [phone]   Fax:  [phone]  Physical Therapy Treatment  Patient Details  Name: Randall Mccall MRN: [id number] Date of Birth: 12-19-1970 Referring Provider (PT): Benito Mccreedy, MD   Encounter Date: 07/31/2018  PT End of Session - 07/31/18 1236    Visit Number  17    Number of Visits  25   per updated POC   Date for PT Re-Evaluation  08/30/18    Authorization Type  UHC Medicare and Medicaid    PT Start Time  1100    PT Stop Time  1150    PT Time Calculation (min)  50 min    Equipment Utilized During Treatment  Gait belt    Activity Tolerance  Patient tolerated treatment well    Behavior During Therapy  [facility] for tasks assessed/performed       Past Medical History:  Diagnosis Date  . Gout   . Hypertension   . Muscle weakness   . Stroke [facility])     Past Surgical History:  Procedure Laterality Date  . KNEE SURGERY      There were no vitals filed for this visit.  Subjective Assessment - 07/31/18 1200    Subjective  Pt reports no hip pain at this time, however has pain with standing and increased walking.      Pertinent History  Pt goes by "[name]"    Limitations  House hold activities;Walking    How long can you stand comfortably?  about 15 mins    How long can you walk comfortably?  about 15 mins     Patient Stated Goals  "Work on my right side and get more balanced."     Currently in Pain?  No/denies   up to 10/10 pain with gait and prolonged standing                      [facility] Adult PT Treatment/Exercise - 07/31/18 1105      Ambulation/Gait   Ambulation/Gait  Yes    Ambulation/Gait Assistance  6: Modified independent (Device/Increase time)    Ambulation/Gait Assistance Details  Pt able to ambulate >1000' without AD with use of R blue rocker over outdoor unlevel surfaces, paved surfaces, uphill/downhill without overt LOB.   Pt reports he feels much more stable with brace.      Ambulation Distance (Feet)  1100 Feet    Assistive device  None    Gait Pattern  Step-through pattern;Decreased arm swing - left;Decreased stance time - right;Decreased weight shift to right;Decreased dorsiflexion - right;Decreased hip/knee flexion - right    Ambulation Surface  Level;Unlevel;Indoor;Outdoor;Paved;Grass      Self-Care   Self-Care  Other Self-Care Comments    Other Self-Care Comments   Had lengthy discussion with pt regarding being as independent as possible in donning/doffing new AFO.  Pt continues to very concerned about which shoes he wears and what "matches" his outfit.  Discussed that a low top shoe will be more beneficial than high top.  Also educated on use of shoe button in order to be able to tie shoes snuggly and independently.  Pt verbalized understanding.  Also educated on how hip tightness is causing increased hip pain and also how improper gait mechanics are also contributing to hip pain.  See TE section for hip stretches performed in session.  Pt verbalized understanding.       Exercises   Exercises  Other Exercises    Other Exercises   Had pt get into prone position to better assess R hip tightness.  Note that he immediately rotate off of R hip to the L.  PT able to adjust, however this did increase intensity of stretch.  Pt educated that if he could tolerate position for up to 30-45 secs this would be a great stretch for home.  Also had pt demonstrate how he was performing supine hip stretch at home.  Note that he is doing correctly, however is unable to flex knee to increase stretch.  Educated that he may need assist from mother if doing this way.  Also had pt perform standing hip flexor stretch at counter top for support (LUE) in a runner's stretch position.  Pt able to do with min cues for decreased trunk rotation and reports increased stretch over supine stretch.  Continued to educate on importance of stretching hip  multiple times throughout the day.  Also had pt perform BLE bridging in which is able to do with ease.  Therefore increased challenge and had him perform RLE only x 10 reps.  Educated for pt to do this way at home and would provide picture at next session (computer system down this session).               PT Education - 07/31/18 1236    Education Details  see self care     Person(s) Educated  Patient    Methods  Explanation    Comprehension  Verbalized understanding       PT Short Term Goals - 07/01/18 1142      PT SHORT TERM GOAL #1   Title  Pt will initiate HEP in order to indicate improved functional mobility and decreased fall risk.  (Target Date: 06/28/18)    Baseline  06/26/18: met with current HEP    Status  Achieved      PT SHORT TERM GOAL #2   Title  Pt will perform 5TSS in </=11 secs without UE support and good B LE weight bearing in order to indicate decreased fall risk.     Baseline  06/26/18: 12.31 sec's without UE support, standard height chair, improved just not to goal    Status  Partially Met      PT SHORT TERM GOAL #3   Title  Pt will improve DGI to >/=17/24 in order to indicate decreased fall risk.     Baseline  06/26/18: 18/24 scored today    Status  Achieved      PT SHORT TERM GOAL #4   Title  Will assess need for RLE AFO and proceed appropriately.     Baseline  06/26/18: have trialed braces in session, awaiting MD order    Status  Achieved      PT SHORT TERM GOAL #5   Title  Pt will ambulate 500' over unlevel outdoor paved surfaces w/ LRAD (brace as needed) at mod I level in order to return to leisure and community activity.     Baseline  07/01/18: Pt able to ambulate 500 ft over unlevel paved surfaces with Allard toe off brace on right at supervision and LOB x1.     Time  4    Period  Weeks    Status  Not Met        PT Long Term Goals - 07/31/18 1241      PT LONG TERM GOAL #1   Title  Pt will be independnet with  final HEP in order to indicate  improved functional mobility and decreased fall risk.  (Target Date: 07/28/18)    Baseline  07/28/18: met today     Time  8    Period  Weeks    Status  Achieved      PT LONG TERM GOAL #2   Title  Pt will improve DGI to >19/24 in order to indicate decreased fall risk.      Baseline  07/28/18: 20/24 with Blue Rocker brace    Time  8    Period  Weeks    Status  Achieved      PT LONG TERM GOAL #3   Title  Pt will increase gait speed to >/=2.62 ft/sec in order to indictate safe community ambulation and improved efficiency of gait.     Baseline  07/28/18: 2.52 ft/sec    Time  8    Period  Weeks    Status  Not Met      PT LONG TERM GOAL #4   Title  Pt will ambulate 1000' over varying outdoor surfaces (both paved and grassy, ramp/incline/curb) w/ LRAD (brace as needed) at mod I level in order to indicate safe community negotiation.     Time  8    Period  Weeks    Status  Achieved            Plan - 07/31/18 1238    Clinical Impression Statement  Skilled session focused on assessment of final LTG.  Pt has met outdoor gait goal.  Pt continues to be limited by increased R hip tightness and gait abnormalities causing increased hip pain.  Will plan to re-cert pt for 4 more weeks (may not need full 4 weeks) in order to allow pt to get his personal AFO and continue to address gait and mobility deficits.      Rehab Potential  Good    Clinical Impairments Affecting Rehab Potential  length of time since CVA    PT Frequency  2x / week    PT Duration  4 weeks    PT Treatment/Interventions  ADLs/Self Care Home Management;Electrical Stimulation;DME Instruction;Gait training;Stair training;Functional mobility training;Therapeutic activities;Therapeutic exercise;Aquatic Therapy;Balance training;Neuromuscular re-education;Cognitive remediation;Patient/family education;Orthotic Fit/Training;Passive range of motion;Energy conservation;Vestibular    PT Next Visit Plan  FGA, add goal, hip flex stretching,  add  single leg bridge to HEP, pt to continue 2/wk for 4 weeks. continue with Bioness for gait/balance/strengthening, NMR for right LE- tall kneeling, half kneeling activities with emphasis on glut/hamstring activation, He prefers toe off however it is too small for shoe.      PT Home Exercise Plan  HEP from [place]: [place] and Agree with Plan of Care  Patient    Family Member Consulted  Mother [name]       Patient will benefit from skilled therapeutic intervention in order to improve the following deficits and impairments:  Abnormal gait, Decreased activity tolerance, Decreased balance, Decreased coordination, Decreased endurance, Decreased knowledge of use of DME, Decreased mobility, Decreased range of motion, Decreased strength, Impaired perceived functional ability, Impaired flexibility, Postural dysfunction, Improper body mechanics, Impaired UE functional use, Impaired tone, Impaired sensation  Visit Diagnosis: Hemiplegia and hemiparesis following cerebral infarction affecting right dominant side (HCC)  Unsteadiness on feet  Muscle weakness (generalized)  Other abnormalities of gait and mobility     Problem List Patient Active Problem List   Diagnosis Date Noted  . CKD (chronic kidney disease) 12/18/2016  . Benign essential  HTN   . Idiopathic gout   . Diastolic dysfunction   . Cocaine abuse ([place])   . Right hemiparesis ([place])   . Monoplegia of upper extremity following cerebral infarction affecting right dominant side ([place])   . Cerebrovascular accident (CVA) ([place])   . Acute ischemic stroke ([place]) 09/15/2016  . Stroke (cerebrum) ([place]) 09/15/2016  . Hypertension 09/15/2016  . Vitamin B12 deficiency 08/10/2015  . Weakness 07/02/2013  . Abnormal CPK 07/02/2013    Cameron Sprang, PT, MPT [facility] [address] [place], [place], [address] Phone: [phone]   Fax:  [phone] 07/31/18, 1:02 PM  Name: Randall Mccall MRN: [id number] Date of Birth: 12-27-1970

## 2018-08-04 ENCOUNTER — Encounter: Payer: Self-pay | Admitting: Physical Therapy

## 2018-08-04 ENCOUNTER — Ambulatory Visit: Payer: Medicare Other | Admitting: Physical Therapy

## 2018-08-04 DIAGNOSIS — M6281 Muscle weakness (generalized): Secondary | ICD-10-CM | POA: Diagnosis not present

## 2018-08-04 DIAGNOSIS — R2681 Unsteadiness on feet: Secondary | ICD-10-CM

## 2018-08-04 DIAGNOSIS — I69351 Hemiplegia and hemiparesis following cerebral infarction affecting right dominant side: Secondary | ICD-10-CM

## 2018-08-04 DIAGNOSIS — R2689 Other abnormalities of gait and mobility: Secondary | ICD-10-CM

## 2018-08-04 NOTE — Patient Instructions (Signed)
Added 08/04/18 to Access Code: [email or web address]  URL: [email or web address]  Seated Hamstring Stretch - 3 reps - 1 sets - 30 seconds hold - 1-2x daily - 7x weekly Pts foot against wall to aid with keeping toe upright for better stretch.

## 2018-08-04 NOTE — Therapy (Signed)
[place] [address] [place], [place], [address] Phone: [phone]   Fax:  [phone]  Physical Therapy Treatment  Patient Details  Name: Randall Mccall MRN: [id number] Date of Birth: [birth year] Referring Provider (PT): Benito Mccreedy, MD   Encounter Date: 08/04/2018  PT End of Session - 08/04/18 1226    Visit Number  18    Number of Visits  25   per updated POC   Date for PT Re-Evaluation  08/30/18    Authorization Type  [employer] Medicare and Medicaid    PT Start Time  1020    PT Stop Time  1103    PT Time Calculation (min)  43 min    Equipment Utilized During Treatment  Gait belt    Activity Tolerance  Patient tolerated treatment well    Behavior During Therapy  WFL for tasks assessed/performed       Past Medical History:  Diagnosis Date  . Gout   . Hypertension   . Muscle weakness   . Stroke [facility])     Past Surgical History:  Procedure Laterality Date  . KNEE SURGERY      There were no vitals filed for this visit.  Subjective Assessment - 08/04/18 1021    Subjective  Pt walked dog to stop sign over the weekend.    Pertinent History  Pt goes by "Randall Mccall"    Limitations  House hold activities;Walking    How long can you stand comfortably?  about 15 mins    How long can you walk comfortably?  about 15 mins     Patient Stated Goals  "Work on my right side and get more balanced."     Currently in Pain?  No/denies    Pain Onset  1 to 4 weeks ago                       [facility] Adult PT Treatment/Exercise - 08/04/18 0001      Ambulation/Gait   Ambulation/Gait  Yes    Ambulation/Gait Assistance  5: Supervision    Ambulation/Gait Assistance Details  in/out of parallel bars working on hip extension/glute activation through R stance phase.                                     Ambulation Distance (Feet)  115 Feet   x2   Assistive device  Other (Comment);Straight cane   R Blue Rocker AFO   Gait Pattern  Step-through pattern;Decreased arm swing - left;Decreased stance time - right;Decreased weight shift to right;Decreased dorsiflexion - right;Decreased hip/knee flexion - right    Ambulation Surface  Level;Indoor      Exercises   Exercises  Knee/Hip      Knee/Hip Exercises: Stretches   Active Hamstring Stretch  Right;5 reps;30 seconds   seated with ball of foot on wall to keep toes upright         Balance Exercises - 08/04/18 1054      Balance Exercises: Standing   Standing, One Foot on a Step  4 inch   working on RLE NMR with R foot on step, working on full extension, glute activation, and anterior R pelvic rotation.  Intermittent UE support.       PT Education - 08/04/18 1220    Education Details  added seated hamstring stretch    Person(s) Educated  Patient  Methods  Explanation;Demonstration;Verbal cues;Handout    Comprehension  Verbalized understanding;Returned demonstration;Verbal cues required;Need further instruction       PT Short Term Goals - 07/01/18 1142      PT SHORT TERM GOAL #1   Title  Pt will initiate HEP in order to indicate improved functional mobility and decreased fall risk.  (Target Date: 06/28/18)    Baseline  06/26/18: met with current HEP    Status  Achieved      PT SHORT TERM GOAL #2   Title  Pt will perform 5TSS in </=11 secs without UE support and good B LE weight bearing in order to indicate decreased fall risk.     Baseline  06/26/18: 12.31 sec's without UE support, standard height chair, improved just not to goal    Status  Partially Met      PT SHORT TERM GOAL #3   Title  Pt will improve DGI to >/=17/24 in order to indicate decreased fall risk.     Baseline  06/26/18: 18/24 scored today    Status  Achieved      PT SHORT TERM GOAL #4   Title  Will assess need for RLE AFO and proceed appropriately.     Baseline  06/26/18: have trialed braces in session, awaiting MD order    Status  Achieved      PT SHORT TERM GOAL #5    Title  Pt will ambulate 500' over unlevel outdoor paved surfaces w/ LRAD (brace as needed) at mod I level in order to return to leisure and community activity.     Baseline  07/01/18: Pt able to ambulate 500 ft over unlevel paved surfaces with Allard toe off brace on right at supervision and LOB x1.     Time  4    Period  Weeks    Status  Not Met        PT Long Term Goals - 07/31/18 1241      PT LONG TERM GOAL #1   Title  Pt will be independnet with final HEP in order to indicate improved functional mobility and decreased fall risk.  (New Target Date: 08/30/18)    Baseline  07/28/18: met today , will be ongoing goal    Time  8    Period  Weeks    Status  On-going      PT LONG TERM GOAL #2   Title  Pt will improve DGI to >19/24 in order to indicate decreased fall risk.      Baseline  07/28/18: 20/24 with Blue Rocker brace    Time  8    Period  Weeks    Status  Achieved      PT LONG TERM GOAL #3   Title  Pt will increase gait speed to >/=2.62 ft/sec in order to indictate safe community ambulation and improved efficiency of gait.     Baseline  07/28/18: 2.52 ft/sec    Time  8    Period  Weeks    Status  On-going      PT LONG TERM GOAL #4   Title  Pt will ambulate 1000' over varying outdoor surfaces (both paved and grassy, ramp/incline/curb) w/ LRAD (brace as needed) at mod I level in order to indicate safe community negotiation.     Time  8    Period  Weeks    Status  Achieved      PT LONG TERM GOAL #5   Title  Will assess FGA  and improve score by 3 points from baseline in order to indicate decreased fall risk.     Time  4    Period  Weeks    Status  New            Plan - 08/04/18 1230    Clinical Impression Statement  Skilled session focused on adding/performing Right seated hamstring stretch to address R hip tightness/pain; NMR for R LE working on full extension in stance phase; and gait mechanics with R AFO donned.  Pt was able to perform hamstring stretch well using  wall to keep toes upright; pt was able to work on R LE control and R glute activation during NMR activity; pt continues to use a lot of R hip flexor force to swing LE forward during gait but is progressing with greater weight shift onto R LE in stance phase.                                                                                    Rehab Potential  Good    Clinical Impairments Affecting Rehab Potential  length of time since CVA    PT Frequency  2x / week    PT Duration  4 weeks    PT Treatment/Interventions  ADLs/Self Care Home Management;Electrical Stimulation;DME Instruction;Gait training;Stair training;Functional mobility training;Therapeutic activities;Therapeutic exercise;Aquatic Therapy;Balance training;Neuromuscular re-education;Cognitive remediation;Patient/family education;Orthotic Fit/Training;Passive range of motion;Energy conservation;Vestibular    PT Next Visit Plan  FGA, add goal, hip flex stretching,  add single leg bridge to HEP, pt to continue 2/wk for 4 weeks. continue with Bioness for gait/balance/strengthening, NMR for right LE- tall kneeling, half kneeling activities with emphasis on glut/hamstring activation, He prefers toe off however it is too small for shoe.      PT Home Exercise Plan  HEP from [employer]: [email or web address]    Consulted and Agree with Plan of Care  Patient    Family Member Consulted  Mother Esther       Patient will benefit from skilled therapeutic intervention in order to improve the following deficits and impairments:  Abnormal gait, Decreased activity tolerance, Decreased balance, Decreased coordination, Decreased endurance, Decreased knowledge of use of DME, Decreased mobility, Decreased range of motion, Decreased strength, Impaired perceived functional ability, Impaired flexibility, Postural dysfunction, Improper body mechanics, Impaired UE functional use, Impaired tone, Impaired sensation  Visit Diagnosis: Hemiplegia and hemiparesis following cerebral  infarction affecting right dominant side (HCC)  Unsteadiness on feet  Muscle weakness (generalized)  Other abnormalities of gait and mobility     Problem List Patient Active Problem List   Diagnosis Date Noted  . CKD (chronic kidney disease) 12/18/2016  . Benign essential HTN   . Idiopathic gout   . Diastolic dysfunction   . Cocaine abuse (HCC)   . Right hemiparesis (HCC)   . 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 Cerebrovascular accident (CVA) (HCC)   . Acute ischemic stroke (HCC) 09/15/2016  . Stroke (cerebrum) (HCC) 09/15/2016  . Hypertension 09/15/2016  . Vitamin B12 deficiency 08/10/2015  . Weakness 07/02/2013  . Abnormal CPK 07/02/2013    Karissa Hicks, PTA  08/04/18, 12:39 PM   [facility] [address] [place], [place], [address] Phone: [phone]   Fax:  [phone]  Name: Randall Mccall MRN: [id number] Date of Birth: 06/22/1971   

## 2018-08-05 ENCOUNTER — Encounter: Payer: Medicare Other | Admitting: Occupational Therapy

## 2018-08-05 DIAGNOSIS — I672 Cerebral atherosclerosis: Secondary | ICD-10-CM | POA: Diagnosis not present

## 2018-08-05 DIAGNOSIS — E785 Hyperlipidemia, unspecified: Secondary | ICD-10-CM | POA: Diagnosis not present

## 2018-08-05 DIAGNOSIS — I10 Essential (primary) hypertension: Secondary | ICD-10-CM | POA: Diagnosis not present

## 2018-08-05 DIAGNOSIS — I69351 Hemiplegia and hemiparesis following cerebral infarction affecting right dominant side: Secondary | ICD-10-CM | POA: Diagnosis not present

## 2018-08-06 ENCOUNTER — Encounter: Payer: Medicare Other | Admitting: Occupational Therapy

## 2018-08-06 ENCOUNTER — Ambulatory Visit: Payer: Medicare Other | Admitting: Physical Therapy

## 2018-08-06 ENCOUNTER — Encounter: Payer: Self-pay | Admitting: Physical Therapy

## 2018-08-06 DIAGNOSIS — I69351 Hemiplegia and hemiparesis following cerebral infarction affecting right dominant side: Secondary | ICD-10-CM | POA: Diagnosis not present

## 2018-08-06 DIAGNOSIS — R2681 Unsteadiness on feet: Secondary | ICD-10-CM | POA: Diagnosis not present

## 2018-08-06 DIAGNOSIS — R2689 Other abnormalities of gait and mobility: Secondary | ICD-10-CM

## 2018-08-06 DIAGNOSIS — M6281 Muscle weakness (generalized): Secondary | ICD-10-CM

## 2018-08-06 NOTE — Therapy (Signed)
[place] [address] [place], [place], [address] Phone: [phone]   Fax:  [phone]  Physical Therapy Treatment  Patient Details  Name: Randall Mccall MRN: [id number] Date of Birth: [birth year] Referring Provider (PT): Benito Mccreedy, MD   Encounter Date: 08/06/2018  PT End of Session - 08/06/18 1233    Visit Number  19    Number of Visits  25   per updated POC   Date for PT Re-Evaluation  08/30/18    Authorization Type  [employer]HC Medicare and Medicaid    PT Start Time  1153    PT Stop Time  1233    PT Time Calculation (min)  40 min    Equipment Utilized During Treatment  Gait belt    Activity Tolerance  Patient tolerated treatment well    Behavior During Therapy  WFL for tasks assessed/performed       Past Medical History:  Diagnosis Date  . Gout   . Hypertension   . Muscle weakness   . Stroke [facility])     Past Surgical History:  Procedure Laterality Date  . KNEE SURGERY      There were no vitals filed for this visit.  Subjective Assessment - 08/06/18 1155    Subjective  Continuing to walk dog. Pt misses dancing.   Patient is accompained by:  Family member    Pertinent History  Pt goes by "[name] hold activities;Walking    How long can you stand comfortably?  about 15 mins    How long can you walk comfortably?  about 15 mins     Patient Stated Goals  "Work on my right side and get more balanced."     Currently in Pain?  No/denies    Pain Onset  1 to 4 weeks ago                       [facility] Adult PT Treatment/Exercise - 08/06/18 0001      Exercises   Exercises  Knee/Hip      Knee/Hip Exercises: Stretches   Active Hamstring Stretch  1 rep;60 seconds      Knee/Hip Exercises: Supine   Quad Sets  Strengthening;Right;10 reps;2 sets , Note pt felt good hip flexor stretch with this exercise.   Knee Flexion  AAROM;Strengthening;Right;1 set;10 reps   hooklying  with RLE off mat, knee flexing and extending with manual resistence.    Other Supine Knee/Hip Exercises  hooklying with right leg off mat ( flexing hip and knee up to height of Left knee) AROM progessing to ARROM, 5x2                                                 Balance Exercises - 08/06/18 1219      Balance Exercises: Standing   Sidestepping  Other reps (comment)   + with squats and stepping into feet together position   Other Standing Exercises  front crossover + braiding (with intermittent UE support)          PT Short Term Goals - 07/01/18 1142      PT SHORT TERM GOAL #1   Title  Pt will initiate HEP in order to indicate improved functional mobility and decreased fall risk.  (Target Date: 06/28/18)  Baseline  06/26/18: met with current HEP    Status  Achieved      PT SHORT TERM GOAL #2   Title  Pt will perform 5TSS in </=11 secs without UE support and good B LE weight bearing in order to indicate decreased fall risk.     Baseline  06/26/18: 12.31 sec's without UE support, standard height chair, improved just not to goal    Status  Partially Met      PT SHORT TERM GOAL #3   Title  Pt will improve DGI to >/=17/24 in order to indicate decreased fall risk.     Baseline  06/26/18: 18/24 scored today    Status  Achieved      PT SHORT TERM GOAL #4   Title  Will assess need for RLE AFO and proceed appropriately.     Baseline  06/26/18: have trialed braces in session, awaiting MD order    Status  Achieved      PT SHORT TERM GOAL #5   Title  Pt will ambulate 500' over unlevel outdoor paved surfaces w/ LRAD (brace as needed) at mod I level in order to return to leisure and community activity.     Baseline  07/01/18: Pt able to ambulate 500 ft over unlevel paved surfaces with Allard toe off brace on right at supervision and LOB x1.     Time  4    Period  Weeks    Status  Not Met        PT Long Term Goals - 07/31/18 1241      PT LONG TERM GOAL #1   Title  Pt will be  independnet with final HEP in order to indicate improved functional mobility and decreased fall risk.  (New Target Date: 08/30/18)    Baseline  07/28/18: met today , will be ongoing goal    Time  8    Period  Weeks    Status  On-going      PT LONG TERM GOAL #2   Title  Pt will improve DGI to >19/24 in order to indicate decreased fall risk.      Baseline  07/28/18: 20/24 with Blue Rocker brace    Time  8    Period  Weeks    Status  Achieved      PT LONG TERM GOAL #3   Title  Pt will increase gait speed to >/=2.62 ft/sec in order to indictate safe community ambulation and improved efficiency of gait.     Baseline  07/28/18: 2.52 ft/sec    Time  8    Period  Weeks    Status  On-going      PT LONG TERM GOAL #4   Title  Pt will ambulate 1000' over varying outdoor surfaces (both paved and grassy, ramp/incline/curb) w/ LRAD (brace as needed) at mod I level in order to indicate safe community negotiation.     Time  8    Period  Weeks    Status  Achieved      PT LONG TERM GOAL #5   Title  Will assess FGA and improve score by 3 points from baseline in order to indicate decreased fall risk.     Time  4    Period  Weeks    Status  New            Plan - 08/06/18 1234    Clinical Impression Statement  Skilled session focused on R LE strengthening and NMR  in standing with dynamic gait tasks.  Pt able to perform strengthening exercises for quads, hamstrings, and glutes with manual resistence and dynamic gait tasks at supervision level with 1 UE support initially with braiding task.                                                                                                 Rehab Potential  Good    Clinical Impairments Affecting Rehab Potential  length of time since CVA    PT Frequency  2x / week    PT Duration  4 weeks    PT Treatment/Interventions  ADLs/Self Care Home Management;Electrical Stimulation;DME Instruction;Gait training;Stair training;Functional mobility training;Therapeutic  activities;Therapeutic exercise;Aquatic Therapy;Balance training;Neuromuscular re-education;Cognitive remediation;Patient/family education;Orthotic Fit/Training;Passive range of motion;Energy conservation;Vestibular    PT Next Visit Plan  FGA, add goal, hip flex stretching,  add single leg bridge to HEP, pt to continue 2/wk for 4 weeks. continue with Bioness for gait/balance/strengthening, NMR for right LE- tall kneeling, half kneeling activities with emphasis on glut/hamstring activation, He prefers toe off however it is too small for shoe.      PT Home Exercise Plan  HEP from [employer]: [place] and Agree with Plan of Care  Patient    Family Member Consulted  Mother [name]       Patient will benefit from skilled therapeutic intervention in order to improve the following deficits and impairments:  Abnormal gait, Decreased activity tolerance, Decreased balance, Decreased coordination, Decreased endurance, Decreased knowledge of use of DME, Decreased mobility, Decreased range of motion, Decreased strength, Impaired perceived functional ability, Impaired flexibility, Postural dysfunction, Improper body mechanics, Impaired UE functional use, Impaired tone, Impaired sensation  Visit Diagnosis: Hemiplegia and hemiparesis following cerebral infarction affecting right dominant side (HCC)  Unsteadiness on feet  Muscle weakness (generalized)  Other abnormalities of gait and mobility     Problem List Patient Active Problem List   Diagnosis Date Noted  . CKD (chronic kidney disease) 12/18/2016  . Benign essential HTN   . Idiopathic gout   . Diastolic dysfunction   . Cocaine abuse ([place])   . Right hemiparesis ([place])   . Monoplegia of upper extremity following cerebral infarction affecting right dominant side ([place])   . Cerebrovascular accident (CVA) ([place])   . Acute ischemic stroke ([place]) 09/15/2016  . Stroke (cerebrum) ([place]) 09/15/2016  . Hypertension 09/15/2016  . Vitamin B12  deficiency 08/10/2015  . Weakness 07/02/2013  . Abnormal CPK 07/02/2013   Bjorn Loser, PTA  08/06/18, 12:47 PM [place] [address] [place], [place], [address] Phone: [phone]   Fax:  [phone]  Name: Randall Mccall MRN: [id number] Date of Birth: [birth year]

## 2018-08-11 ENCOUNTER — Encounter: Payer: Self-pay | Admitting: Rehabilitation

## 2018-08-11 ENCOUNTER — Ambulatory Visit: Payer: Medicare Other | Admitting: Rehabilitation

## 2018-08-11 DIAGNOSIS — R2681 Unsteadiness on feet: Secondary | ICD-10-CM

## 2018-08-11 DIAGNOSIS — R2689 Other abnormalities of gait and mobility: Secondary | ICD-10-CM

## 2018-08-11 DIAGNOSIS — M6281 Muscle weakness (generalized): Secondary | ICD-10-CM

## 2018-08-11 DIAGNOSIS — I69351 Hemiplegia and hemiparesis following cerebral infarction affecting right dominant side: Secondary | ICD-10-CM

## 2018-08-11 NOTE — Therapy (Signed)
[facility] [address] [place], [place], [address] Phone: [phone]   Fax:  [phone]  Physical Therapy Treatment and Progress Note  Patient Details  Name: Randall Mccall MRN: [id number] Date of Birth: Oct 25, 1970 Referring Provider (PT): Benito Mccreedy, MD   Encounter Date: 08/11/2018  PT End of Session - 08/11/18 1150    Visit Number  20    Number of Visits  25   per updated POC   Date for PT Re-Evaluation  08/30/18    Authorization Type  [employer]HC Medicare and Medicaid    PT Start Time  1017    PT Stop Time  1102    PT Time Calculation (min)  45 min    Equipment Utilized During Treatment  Gait belt    Activity Tolerance  Patient tolerated treatment well    Behavior During Therapy  [facility] for tasks assessed/performed       Past Medical History:  Diagnosis Date  . Gout   . Hypertension   . Muscle weakness   . Stroke [facility])     Past Surgical History:  Procedure Laterality Date  . KNEE SURGERY      There were no vitals filed for this visit.  Subjective Assessment - 08/11/18 1044    Subjective  Pt fell from bed.  Was rolling to the R and didn't have enough room.     Pertinent History  Pt goes by "Randall Mccall"    Limitations  House hold activities;Walking    How long can you stand comfortably?  about 15 mins    How long can you walk comfortably?  about 15 mins     Patient Stated Goals  "Work on my right side and get more balanced."     Currently in Pain?  No/denies   Did have a lot of pain in R shoulder from fall                      [facility] Adult PT Treatment/Exercise - 08/11/18 1047      Bed Mobility   Bed Mobility  Rolling Right    Rolling Right  Supervision/verbal cueing    Rolling Right Details (indicate cue type and reason)  Addressed rolling in bed during session providing cues for scooting left prior to rolling R.  Also reviewed RUE positioning for safety.  Pt did not have any pain  with passive or active RUE ROM during session in supine position.        Ambulation/Gait   Ambulation/Gait  Yes    Ambulation/Gait Assistance  5: Supervision;4: Min guard    Ambulation/Gait Assistance Details  Following NMR, had pt ambulate without AD and without AFO.  Note that initially he tended to vault off of RLE, however when cued he was able to decrease this and gait seemed much smoother and also note improved R hip flexibility.  Min tactile and verbal cues for posture and improved R hip protraction during stance.     Ambulation Distance (Feet)  230 Feet    Assistive device  None    Gait Pattern  Step-through pattern;Decreased arm swing - left;Decreased stance time - right;Decreased weight shift to right;Decreased dorsiflexion - right;Decreased hip/knee flexion - right    Ambulation Surface  Level;Indoor      Self-Care   Self-Care  Other Self-Care Comments    Other Self-Care Comments   Discussed setting up bed to improve safety and prevent falls from bed.  Neuro Re-ed    Neuro Re-ed Details   Supine BLE bridging x 10 reps progressing to RLE only bridging x 10 reps.   Tall kneeling squats x 10 reps without UE support with cues for improved R hip extension.  Progressed to L half kneel decreasing amount of UE support from single to no UE support x 5 reps with 5 sec holds (of each).  L half kneeling to tall kneeling transitions for R hip stability.  Pt initially needing increased support and cues, however with increased repetition did VERY well with transitions.        Exercises   Other Exercises   R hip flex stretch off EOM x 3 mins with light overpressure from PT.  Also performed standing hip stretch (runner's stretch pose) x 2 sets of 20 secs.             PT Education - 08/11/18 1150    Education Details  see bed mobility and self care     Person(s) Educated  Patient    Methods  Explanation;Demonstration    Comprehension  Verbalized understanding       PT Short Term  Goals - 07/01/18 1142      PT SHORT TERM GOAL #1   Title  Pt will initiate HEP in order to indicate improved functional mobility and decreased fall risk.  (Target Date: 06/28/18)    Baseline  06/26/18: met with current HEP    Status  Achieved      PT SHORT TERM GOAL #2   Title  Pt will perform 5TSS in </=11 secs without UE support and good B LE weight bearing in order to indicate decreased fall risk.     Baseline  06/26/18: 12.31 sec's without UE support, standard height chair, improved just not to goal    Status  Partially Met      PT SHORT TERM GOAL #3   Title  Pt will improve DGI to >/=17/24 in order to indicate decreased fall risk.     Baseline  06/26/18: 18/24 scored today    Status  Achieved      PT SHORT TERM GOAL #4   Title  Will assess need for RLE AFO and proceed appropriately.     Baseline  06/26/18: have trialed braces in session, awaiting MD order    Status  Achieved      PT SHORT TERM GOAL #5   Title  Pt will ambulate 500' over unlevel outdoor paved surfaces w/ LRAD (brace as needed) at mod I level in order to return to leisure and community activity.     Baseline  07/01/18: Pt able to ambulate 500 ft over unlevel paved surfaces with Allard toe off brace on right at supervision and LOB x1.     Time  4    Period  Weeks    Status  Not Met        PT Long Term Goals - 07/31/18 1241      PT LONG TERM GOAL #1   Title  Pt will be independnet with final HEP in order to indicate improved functional mobility and decreased fall risk.  (New Target Date: 08/30/18)    Baseline  07/28/18: met today , will be ongoing goal    Time  8    Period  Weeks    Status  On-going      PT LONG TERM GOAL #2   Title  Pt will improve DGI to >19/24 in order to indicate  decreased fall risk.      Baseline  07/28/18: 20/24 with Blue Rocker brace    Time  8    Period  Weeks    Status  Achieved      PT LONG TERM GOAL #3   Title  Pt will increase gait speed to >/=2.62 ft/sec in order to indictate  safe community ambulation and improved efficiency of gait.     Baseline  07/28/18: 2.52 ft/sec    Time  8    Period  Weeks    Status  On-going      PT LONG TERM GOAL #4   Title  Pt will ambulate 1000' over varying outdoor surfaces (both paved and grassy, ramp/incline/curb) w/ LRAD (brace as needed) at mod I level in order to indicate safe community negotiation.     Time  8    Period  Weeks    Status  Achieved      PT LONG TERM GOAL #5   Title  Will assess FGA and improve score by 3 points from baseline in order to indicate decreased fall risk.     Time  4    Period  Weeks    Status  New            Plan - 08/11/18 1253    Clinical Impression Statement  Skilled session focused on safety with bed mobility due to recent fall from bed, R LE flexibility and R NMR for improved R hip protraction to carryover to gait.  Note marked improvement today following NMR with gait.      Rehab Potential  Good    Clinical Impairments Affecting Rehab Potential  length of time since CVA    PT Frequency  2x / week    PT Duration  4 weeks    PT Treatment/Interventions  ADLs/Self Care Home Management;Electrical Stimulation;DME Instruction;Gait training;Stair training;Functional mobility training;Therapeutic activities;Therapeutic exercise;Aquatic Therapy;Balance training;Neuromuscular re-education;Cognitive remediation;Patient/family education;Orthotic Fit/Training;Passive range of motion;Energy conservation;Vestibular    PT Next Visit Plan  FGA, add goal, hip flex stretching,  add single leg bridge to HEP, pt to continue 2/wk for 4 weeks. continue with Bioness for gait/balance/strengthening, NMR for right LE- tall kneeling, half kneeling activities with emphasis on glut/hamstring activation, He prefers toe off however it is too small for shoe.      PT Home Exercise Plan  HEP from [place]: [place] and Agree with Plan of Care  Patient    Family Member Consulted  Mother [name]        Patient will benefit from skilled therapeutic intervention in order to improve the following deficits and impairments:  Abnormal gait, Decreased activity tolerance, Decreased balance, Decreased coordination, Decreased endurance, Decreased knowledge of use of DME, Decreased mobility, Decreased range of motion, Decreased strength, Impaired perceived functional ability, Impaired flexibility, Postural dysfunction, Improper body mechanics, Impaired UE functional use, Impaired tone, Impaired sensation  Visit Diagnosis: Hemiplegia and hemiparesis following cerebral infarction affecting right dominant side (HCC)  Unsteadiness on feet  Muscle weakness (generalized)  Other abnormalities of gait and mobility    Progress Note Reporting Period 07/07/18 to 08/11/18  See note below for Objective Data and Assessment of Progress/Goals.       Problem List Patient Active Problem List   Diagnosis Date Noted  . CKD (chronic kidney disease) 12/18/2016  . Benign essential HTN   . Idiopathic gout   . Diastolic dysfunction   . Cocaine abuse ([place])   . Right hemiparesis ([place])   .  Monoplegia of upper extremity following cerebral infarction affecting right dominant side ([place])   . Cerebrovascular accident (CVA) ([place])   . Acute ischemic stroke ([place]) 09/15/2016  . Stroke (cerebrum) ([place]) 09/15/2016  . Hypertension 09/15/2016  . Vitamin B12 deficiency 08/10/2015  . Weakness 07/02/2013  . Abnormal CPK 07/02/2013    Cameron Sprang, PT, MPT [facility] [address] [place], [place], [address] Phone: [phone]   Fax:  [phone] 08/11/18, 12:57 PM  Name: Randall Mccall MRN: [id number] Date of Birth: August 15, 1971

## 2018-08-13 ENCOUNTER — Encounter: Payer: Medicare Other | Admitting: Occupational Therapy

## 2018-08-15 ENCOUNTER — Ambulatory Visit: Payer: Medicare Other | Admitting: Rehabilitation

## 2018-08-15 ENCOUNTER — Encounter: Payer: Self-pay | Admitting: Rehabilitation

## 2018-08-15 DIAGNOSIS — M6281 Muscle weakness (generalized): Secondary | ICD-10-CM

## 2018-08-15 DIAGNOSIS — R2681 Unsteadiness on feet: Secondary | ICD-10-CM | POA: Diagnosis not present

## 2018-08-15 DIAGNOSIS — R2689 Other abnormalities of gait and mobility: Secondary | ICD-10-CM

## 2018-08-15 DIAGNOSIS — I69351 Hemiplegia and hemiparesis following cerebral infarction affecting right dominant side: Secondary | ICD-10-CM | POA: Diagnosis not present

## 2018-08-15 NOTE — Therapy (Signed)
[facility] [address] [place], [place], [address] Phone: [phone]   Fax:  [phone]  Physical Therapy Treatment  Patient Details  Name: Randall Mccall MRN: [id number] Date of Birth: [birth year] Referring Provider (PT): Benito Mccreedy, MD   Encounter Date: 08/15/2018  PT End of Session - 08/15/18 1459    Visit Number  21    Number of Visits  25   per updated POC   Date for PT Re-Evaluation  08/30/18    Authorization Type  [employer]HC Medicare and Medicaid    PT Start Time  1148    PT Stop Time  1230    PT Time Calculation (min)  42 min    Equipment Utilized During Treatment  Gait belt    Activity Tolerance  Patient tolerated treatment well    Behavior During Therapy  WFL for tasks assessed/performed       Past Medical History:  Diagnosis Date  . Gout   . Hypertension   . Muscle weakness   . Stroke [facility])     Past Surgical History:  Procedure Laterality Date  . KNEE SURGERY      There were no vitals filed for this visit.  Subjective Assessment - 08/15/18 1201    Subjective  Pt reports no changes, no falls since last visit.     Pertinent History  Pt goes by "[name]"    Limitations  House hold activities;Walking    How long can you stand comfortably?  about 15 mins    How long can you walk comfortably?  about 15 mins     Patient Stated Goals  "Work on my right side and get more balanced."     Currently in Pain?  No/denies         [facility] PT Assessment - 08/15/18 1210      Functional Gait  Assessment   Gait assessed   Yes    Gait Level Surface  Walks 20 ft, slow speed, abnormal gait pattern, evidence for imbalance or deviates 10-15 in outside of the 12 in walkway width. Requires more than 7 sec to ambulate 20 ft.   7.94   Change in Gait Speed  Makes only minor adjustments to walking speed, or accomplishes a change in speed with significant gait deviations, deviates 10-15 in outside the 12 in walkway  width, or changes speed but loses balance but is able to recover and continue walking.    Gait with Horizontal Head Turns  Performs head turns smoothly with no change in gait. Deviates no more than 6 in outside 12 in walkway width    Gait with Vertical Head Turns  Performs head turns with no change in gait. Deviates no more than 6 in outside 12 in walkway width.    Gait and Pivot Turn  Pivot turns safely in greater than 3 sec and stops with no loss of balance, or pivot turns safely within 3 sec and stops with mild imbalance, requires small steps to catch balance.    Step Over Obstacle  Is able to step over one shoe box (4.5 in total height) without changing gait speed. No evidence of imbalance.    Gait with Narrow Base of Support  Ambulates less than 4 steps heel to toe or cannot perform without assistance.    Gait with Eyes Closed  Walks 20 ft, slow speed, abnormal gait pattern, evidence for imbalance, deviates 10-15 in outside 12 in walkway width. Requires more than 9 sec  to ambulate 20 ft.    Ambulating Backwards  Walks 20 ft, uses assistive device, slower speed, mild gait deviations, deviates 6-10 in outside 12 in walkway width.    Steps  Alternating feet, must use rail.    Total Score  17    FGA comment:  < 19 = high risk fall                   [facility] Adult PT Treatment/Exercise - 08/15/18 1155      Ambulation/Gait   Ambulation/Gait  Yes    Ambulation/Gait Assistance  5: Supervision;4: Min guard    Ambulation/Gait Assistance Details  Utilized R blue rocker AFO during session.  Gait with emphasis on increasing gait speed and softer R step for improved controlled.  Pt did very well, but did need continuous min/guard from PT to maintain increased gait speed.  Encouraged this once pt has brace to increase efficiency and decrease fall risk.      Ambulation Distance (Feet)  300 Feet    Assistive device  None    Gait Pattern  Step-through pattern;Decreased arm swing - left;Decreased  stance time - right;Decreased weight shift to right;Decreased dorsiflexion - right;Decreased hip/knee flexion - right    Ambulation Surface  Level;Indoor    Stairs  Yes    Stairs Assistance  5: Supervision    Stairs Assistance Details (indicate cue type and reason)  cues for technique on RLE due to AFO    Stair Management Technique  One rail Left;Alternating pattern;Forwards    Number of Stairs  8    Height of Stairs  6      Neuro Re-ed    Neuro Re-ed Details   Performed backwards gait during session due to noted deficits during FGA testing.  Pt did better once cued for improved R knee flexion then extension with weight shift onto RLE before stepping RLE (performed x 2 sets of 50').        Exercises   Other Exercises   With pt in prone, performed hip flex stretch with PT providing gentle downward glide to increase hip extension.  Was also able to flex R knee while in prone for increased stretch.  Pt tolerated MUCH better than previous sessions and pt reports he is working on it at home.  Also note R calf to be tight during gait and upon assessment has decreased ROM therefore demonstrated and provided with standing R calf stretch during session.              PT Education - 08/15/18 1458    Education Details  additional calf stretch to HEP, delivery date for AFO     Person(s) Educated  Patient    Methods  Explanation    Comprehension  Verbalized understanding       PT Short Term Goals - 07/01/18 1142      PT SHORT TERM GOAL #1   Title  Pt will initiate HEP in order to indicate improved functional mobility and decreased fall risk.  (Target Date: 06/28/18)    Baseline  06/26/18: met with current HEP    Status  Achieved      PT SHORT TERM GOAL #2   Title  Pt will perform 5TSS in </=11 secs without UE support and good B LE weight bearing in order to indicate decreased fall risk.     Baseline  06/26/18: 12.31 sec's without UE support, standard height chair, improved just not to goal  Status  Partially Met      PT SHORT TERM GOAL #3   Title  Pt will improve DGI to >/=17/24 in order to indicate decreased fall risk.     Baseline  06/26/18: 18/24 scored today    Status  Achieved      PT SHORT TERM GOAL #4   Title  Will assess need for RLE AFO and proceed appropriately.     Baseline  06/26/18: have trialed braces in session, awaiting MD order    Status  Achieved      PT SHORT TERM GOAL #5   Title  Pt will ambulate 500' over unlevel outdoor paved surfaces w/ LRAD (brace as needed) at mod I level in order to return to leisure and community activity.     Baseline  07/01/18: Pt able to ambulate 500 ft over unlevel paved surfaces with Allard toe off brace on right at supervision and LOB x1.     Time  4    Period  Weeks    Status  Not Met        PT Long Term Goals - 07/31/18 1241      PT LONG TERM GOAL #1   Title  Pt will be independnet with final HEP in order to indicate improved functional mobility and decreased fall risk.  (New Target Date: 08/30/18)    Baseline  07/28/18: met today , will be ongoing goal    Time  8    Period  Weeks    Status  On-going      PT LONG TERM GOAL #2   Title  Pt will improve DGI to >19/24 in order to indicate decreased fall risk.      Baseline  07/28/18: 20/24 with Blue Rocker brace    Time  8    Period  Weeks    Status  Achieved      PT LONG TERM GOAL #3   Title  Pt will increase gait speed to >/=2.62 ft/sec in order to indictate safe community ambulation and improved efficiency of gait.     Baseline  07/28/18: 2.52 ft/sec    Time  8    Period  Weeks    Status  On-going      PT LONG TERM GOAL #4   Title  Pt will ambulate 1000' over varying outdoor surfaces (both paved and grassy, ramp/incline/curb) w/ LRAD (brace as needed) at mod I level in order to indicate safe community negotiation.     Time  8    Period  Weeks    Status  Achieved      PT LONG TERM GOAL #5   Title  Will assess FGA and improve score by 3 points from  baseline in order to indicate decreased fall risk.     Time  4    Period  Weeks    Status  New            Plan - 08/15/18 1459    Clinical Impression Statement  Skilled session focused on assessment of FGA for LTG.  Note score of 17/30 indicative of high fall risk.  Addressed deficits based on test results during session.  Pt doing very well with R hip flex stretch and home and note improvement in quality of gait.     Rehab Potential  Good    Clinical Impairments Affecting Rehab Potential  length of time since CVA    PT Frequency  2x / week    PT  Duration  4 weeks    PT Treatment/Interventions  ADLs/Self Care Home Management;Electrical Stimulation;DME Instruction;Gait training;Stair training;Functional mobility training;Therapeutic activities;Therapeutic exercise;Aquatic Therapy;Balance training;Neuromuscular re-education;Cognitive remediation;Patient/family education;Orthotic Fit/Training;Passive range of motion;Energy conservation;Vestibular    PT Next Visit Plan  Randall Mccall reports brace delivery for 12/31 session, please inform we will likely D/C that week (POC ends 1/4), ensure safe technique with standing calf stretch, continue with Bioness for gait/balance/strengthening, NMR for right LE- tall kneeling, half kneeling activities with emphasis on glut/hamstring activation, He prefers toe off however it is too small for shoe.      PT Home Exercise Plan  HEP from [place]: [place] and Agree with Plan of Care  Patient       Patient will benefit from skilled therapeutic intervention in order to improve the following deficits and impairments:  Abnormal gait, Decreased activity tolerance, Decreased balance, Decreased coordination, Decreased endurance, Decreased knowledge of use of DME, Decreased mobility, Decreased range of motion, Decreased strength, Impaired perceived functional ability, Impaired flexibility, Postural dysfunction, Improper body mechanics, Impaired UE functional  use, Impaired tone, Impaired sensation  Visit Diagnosis: Hemiplegia and hemiparesis following cerebral infarction affecting right dominant side (HCC)  Unsteadiness on feet  Muscle weakness (generalized)  Other abnormalities of gait and mobility     Problem List Patient Active Problem List   Diagnosis Date Noted  . CKD (chronic kidney disease) 12/18/2016  . Benign essential HTN   . Idiopathic gout   . Diastolic dysfunction   . Cocaine abuse ([place])   . Right hemiparesis ([place])   . Monoplegia of upper extremity following cerebral infarction affecting right dominant side ([place])   . Cerebrovascular accident (CVA) ([place])   . Acute ischemic stroke ([place]) 09/15/2016  . Stroke (cerebrum) ([place]) 09/15/2016  . Hypertension 09/15/2016  . Vitamin B12 deficiency 08/10/2015  . Weakness 07/02/2013  . Abnormal CPK 07/02/2013    Cameron Sprang, PT, MPT [facility] [address] [place], [place], [address] Phone: [phone]   Fax:  [phone] 08/15/18, 3:02 PM  Name: Randall Mccall MRN: [id number] Date of Birth: 1971-07-02

## 2018-08-15 NOTE — Patient Instructions (Signed)
Access Code: [email or web address]  URL: [email or web address]  Date: 08/15/2018  Prepared by: Cameron Sprang   Exercises  Figure 4 Bridge - 5 reps - 2 sets - 1-2x daily - 7x weekly  Supine Hip Flexor Stretch with Weight - 3 reps - 1 sets - 60 hold - 1x daily - 7x weekly  Sit to Stand in Stride with AFO and UE Assist in LE Alignment - 10 reps - 1 sets - 1x daily - 7x weekly  Standing Gastroc Stretch - 3 reps - 1 sets - 30 hold - 2x daily - 7x weekly

## 2018-08-22 ENCOUNTER — Encounter

## 2018-08-26 ENCOUNTER — Encounter: Payer: Self-pay | Admitting: Physical Therapy

## 2018-08-26 ENCOUNTER — Ambulatory Visit: Payer: Medicare Other | Admitting: Physical Therapy

## 2018-08-26 DIAGNOSIS — I69351 Hemiplegia and hemiparesis following cerebral infarction affecting right dominant side: Secondary | ICD-10-CM

## 2018-08-26 DIAGNOSIS — R2681 Unsteadiness on feet: Secondary | ICD-10-CM | POA: Diagnosis not present

## 2018-08-26 DIAGNOSIS — R2689 Other abnormalities of gait and mobility: Secondary | ICD-10-CM | POA: Diagnosis not present

## 2018-08-26 DIAGNOSIS — M21371 Foot drop, right foot: Secondary | ICD-10-CM | POA: Diagnosis not present

## 2018-08-26 DIAGNOSIS — M6281 Muscle weakness (generalized): Secondary | ICD-10-CM | POA: Diagnosis not present

## 2018-08-27 NOTE — Therapy (Signed)
[facility] [address] [place], [place], [address] Phone: [phone]   Fax:  [phone]  Physical Therapy Treatment  Patient Details  Name: Randall Mccall MRN: [id number] Date of Birth: 1971/06/01 Referring Provider (PT): Benito Mccreedy, MD   Encounter Date: 08/26/2018    08/26/18 1154  PT Visits / Re-Eval  Visit Number 22  Number of Visits 25 (per updated POC)  Date for PT Re-Evaluation 08/30/18  Authorization  Authorization Type UHC Medicare and Medicaid  PT Time Calculation  PT Start Time 1148  PT Stop Time 1226  PT Time Calculation (min) 38 min  PT - End of Session  Equipment Utilized During Treatment Gait belt  Activity Tolerance Patient tolerated treatment well  Behavior During Therapy WFL for tasks assessed/performed     Past Medical History:  Diagnosis Date  . Gout   . Hypertension   . Muscle weakness   . Stroke [facility])     Past Surgical History:  Procedure Laterality Date  . KNEE SURGERY      There were no vitals filed for this visit.   08/26/18 1153  Symptoms/Limitations  Subjective No new complaints. No falls or pain to report.   Pertinent History Pt goes by "Randall Mccall"  Limitations House hold activities;Walking  How long can you stand comfortably? about 15 mins  How long can you walk comfortably? about 15 mins   Patient Stated Goals "Work on my right side and get more balanced."   Pain Assessment  Currently in Pain? No/denies  Pain Score 0      08/26/18 1203  Transfers  Transfers Sit to Stand;Stand to Sit  Sit to Stand 6: Modified independent (Device/Increase time);With upper extremity assist;Without upper extremity assist  Stand to Sit 6: Modified independent (Device/Increase time);With upper extremity assist;Without upper extremity assist  Ambulation/Gait  Ambulation/Gait Yes  Ambulation/Gait Assistance 6: Modified independent (Device/Increase time)  Ambulation/Gait  Assistance Details used pt's new Srpy step brace on right LE with gait. no balance issues noted with good foot clearance throughout.   Ambulation Distance (Feet) 1000 Feet (x1, plus around gym)  Assistive device None;Other (Comment) (Sprystep brace)  Gait Pattern Step-through pattern;Decreased stance time - right;Decreased step length - left;Decreased weight shift to right  Ambulation Surface Level;Unlevel;Indoor;Outdoor;Paved  Gait velocity 12.56 sec's= 2.61 ft/sec no AD with Sprystep brace on rigth LE  Stairs Yes  Stairs Assistance 6: Modified independent (Device/Increase time)  Stairs Assistance Details (indicate cue type and reason) no balance issues noted.   Stair Management Technique One rail Left;Alternating pattern;Forwards  Number of Stairs 4  Height of Stairs 6  Ramp 5: Supervision  Ramp Details (indicate cue type and reason) no AD with new brace to right LE only  Curb 5: Supervision  Curb Details (indicate cue type and reason) no AD with new brace to right LE only  Self-Care  Self-Care Other Self-Care Comments  Other Self-Care Comments  edcuated pt on wear schedule for brace and how to slowly progress it. also educated pt on checking skin after each wear of the brace. pt was able to self don the brace in session today with increased time and cues. He states hsi mom can help at home if needed.        PT Short Term Goals - 07/01/18 1142      PT SHORT TERM GOAL #1   Title  Pt will initiate HEP in order to indicate improved functional mobility and decreased fall risk.  (Target Date:  06/28/18)    Baseline  06/26/18: met with current HEP    Status  Achieved      PT SHORT TERM GOAL #2   Title  Pt will perform 5TSS in </=11 secs without UE support and good B LE weight bearing in order to indicate decreased fall risk.     Baseline  06/26/18: 12.31 sec's without UE support, standard height chair, improved just not to goal    Status  Partially Met      PT SHORT TERM GOAL #3    Title  Pt will improve DGI to >/=17/24 in order to indicate decreased fall risk.     Baseline  06/26/18: 18/24 scored today    Status  Achieved      PT SHORT TERM GOAL #4   Title  Will assess need for RLE AFO and proceed appropriately.     Baseline  06/26/18: have trialed braces in session, awaiting MD order    Status  Achieved      PT SHORT TERM GOAL #5   Title  Pt will ambulate 500' over unlevel outdoor paved surfaces w/ LRAD (brace as needed) at mod I level in order to return to leisure and community activity.     Baseline  07/01/18: Pt able to ambulate 500 ft over unlevel paved surfaces with Allard toe off brace on right at supervision and LOB x1.     Time  4    Period  Weeks    Status  Not Met        PT Long Term Goals - 08/26/18 1156      PT LONG TERM GOAL #1   Title  Pt will be independnet with final HEP in order to indicate improved functional mobility and decreased fall risk.  (New Target Date: 08/30/18)    Baseline  07/28/18: met today , will be ongoing goal    Time  8    Period  Weeks    Status  On-going      PT LONG TERM GOAL #2   Title  Pt will improve DGI to >19/24 in order to indicate decreased fall risk.      Baseline  07/28/18: 20/24 with Blue Rocker brace    Status  Achieved      PT LONG TERM GOAL #3   Title  Pt will increase gait speed to >/=2.62 ft/sec in order to indictate safe community ambulation and improved efficiency of gait.     Baseline  07/28/18: 2.52 ft/sec    Time  8    Period  Weeks    Status  On-going      PT LONG TERM GOAL #4   Title  Pt will ambulate 1000' over varying outdoor surfaces (both paved and grassy, ramp/incline/curb) w/ LRAD (brace as needed) at mod I level in order to indicate safe community negotiation.     Status  Achieved      PT LONG TERM GOAL #5   Title  Will assess FGA and improve score by 3 points from baseline in order to indicate decreased fall risk.     Time  4    Period  Weeks    Status  New         08/26/18 1154   Plan  Clinical Impression Statement Today"s skilled session focused on issuing pt his new brace and use of the new brace with gait. Started addressing LTGs as well for anticpated discharge at next visit.   Pt will benefit  from skilled therapeutic intervention in order to improve on the following deficits Abnormal gait;Decreased activity tolerance;Decreased balance;Decreased coordination;Decreased endurance;Decreased knowledge of use of DME;Decreased mobility;Decreased range of motion;Decreased strength;Impaired perceived functional ability;Impaired flexibility;Postural dysfunction;Improper body mechanics;Impaired UE functional use;Impaired tone;Impaired sensation  Rehab Potential Good  Clinical Impairments Affecting Rehab Potential length of time since CVA  PT Frequency 2x / week  PT Duration 4 weeks  PT Treatment/Interventions ADLs/Self Care Home Management;Electrical Stimulation;DME Instruction;Gait training;Stair training;Functional mobility training;Therapeutic activities;Therapeutic exercise;Aquatic Therapy;Balance training;Neuromuscular re-education;Cognitive remediation;Patient/family education;Orthotic Fit/Training;Passive range of motion;Energy conservation;Vestibular  PT Next Visit Plan assess remaining 2 goals for anticpated discharge at next visit- HEP and FGA  PT Home Exercise Plan HEP from [place]: [place] and Agree with Plan of Care Patient         Patient will benefit from skilled therapeutic intervention in order to improve the following deficits and impairments:  Abnormal gait, Decreased activity tolerance, Decreased balance, Decreased coordination, Decreased endurance, Decreased knowledge of use of DME, Decreased mobility, Decreased range of motion, Decreased strength, Impaired perceived functional ability, Impaired flexibility, Postural dysfunction, Improper body mechanics, Impaired UE functional use, Impaired tone, Impaired sensation  Visit  Diagnosis: Hemiplegia and hemiparesis following cerebral infarction affecting right dominant side (HCC)  Unsteadiness on feet  Muscle weakness (generalized)  Other abnormalities of gait and mobility     Problem List Patient Active Problem List   Diagnosis Date Noted  . CKD (chronic kidney disease) 12/18/2016  . Benign essential HTN   . Idiopathic gout   . Diastolic dysfunction   . Cocaine abuse ([place])   . Right hemiparesis ([place])   . Monoplegia of upper extremity following cerebral infarction affecting right dominant side ([place])   . Cerebrovascular accident (CVA) ([place])   . Acute ischemic stroke ([place]) 09/15/2016  . Stroke (cerebrum) ([place]) 09/15/2016  . Hypertension 09/15/2016  . Vitamin B12 deficiency 08/10/2015  . Weakness 07/02/2013  . Abnormal CPK 07/02/2013    Willow Ora, PTA, [facility] Neuro [facility] [address], [place], [place] [address] [phone] 08/27/18, 9:49 PM   Name: Randall Mccall MRN: [id number] Date of Birth: November 25, 1970

## 2018-08-28 ENCOUNTER — Ambulatory Visit: Payer: Medicare Other | Attending: Internal Medicine | Admitting: Rehabilitation

## 2018-08-28 ENCOUNTER — Encounter: Payer: Self-pay | Admitting: Rehabilitation

## 2018-08-28 DIAGNOSIS — M6281 Muscle weakness (generalized): Secondary | ICD-10-CM | POA: Diagnosis not present

## 2018-08-28 DIAGNOSIS — R2689 Other abnormalities of gait and mobility: Secondary | ICD-10-CM | POA: Diagnosis not present

## 2018-08-28 DIAGNOSIS — R2681 Unsteadiness on feet: Secondary | ICD-10-CM

## 2018-08-28 DIAGNOSIS — I69351 Hemiplegia and hemiparesis following cerebral infarction affecting right dominant side: Secondary | ICD-10-CM | POA: Insufficient documentation

## 2018-08-28 NOTE — Therapy (Signed)
[facility] [address] [place], [place], [address] Phone: [phone]   Fax:  [phone]  Physical Therapy Treatment and D/C Summary   Patient Details  Name: Randall Mccall MRN: [id number] Date of Birth: Feb 28, 1971 Referring Provider (PT): Benito Mccreedy, MD   Encounter Date: 08/28/2018  PT End of Session - 08/28/18 1106    Visit Number  23    Number of Visits  25   per updated POC   Date for PT Re-Evaluation  08/30/18    Authorization Type  UHC Medicare and Medicaid    PT Start Time  1102   didn't need whole time, D/C visit   PT Stop Time  1135    PT Time Calculation (min)  33 min    Equipment Utilized During Treatment  Gait belt    Activity Tolerance  Patient tolerated treatment well    Behavior During Therapy  [facility] for tasks assessed/performed       Past Medical History:  Diagnosis Date  . Gout   . Hypertension   . Muscle weakness   . Stroke [facility])     Past Surgical History:  Procedure Laterality Date  . KNEE SURGERY      There were no vitals filed for this visit.  Subjective Assessment - 08/28/18 1105    Subjective  "I'm doing good with this brace and it is so comfortable."     Patient is accompained by:  Family member    Pertinent History  Pt goes by "[name] hold activities;Walking    How long can you walk comfortably?  about 15 mins     Patient Stated Goals  "Work on my right side and get more balanced."     Currently in Pain?  No/denies         [facility] PT Assessment - 08/28/18 1116      Ambulation/Gait   Gait velocity  2.73 ft/sec without device, with AFO      Functional Gait  Assessment   Gait assessed   Yes    Gait Level Surface  Walks 20 ft in less than 7 sec but greater than 5.5 sec, uses assistive device, slower speed, mild gait deviations, or deviates 6-10 in outside of the 12 in walkway width.    Change in Gait Speed  Able to change speed, demonstrates  mild gait deviations, deviates 6-10 in outside of the 12 in walkway width, or no gait deviations, unable to achieve a major change in velocity, or uses a change in velocity, or uses an assistive device.    Gait with Horizontal Head Turns  Performs head turns smoothly with no change in gait. Deviates no more than 6 in outside 12 in walkway width    Gait with Vertical Head Turns  Performs head turns with no change in gait. Deviates no more than 6 in outside 12 in walkway width.    Gait and Pivot Turn  Pivot turns safely within 3 sec and stops quickly with no loss of balance.    Step Over Obstacle  Is able to step over one shoe box (4.5 in total height) without changing gait speed. No evidence of imbalance.    Gait with Narrow Base of Support  Ambulates less than 4 steps heel to toe or cannot perform without assistance.    Gait with Eyes Closed  Walks 20 ft, uses assistive device, slower speed, mild gait deviations, deviates 6-10 in outside 12  in walkway width. Ambulates 20 ft in less than 9 sec but greater than 7 sec.    Ambulating Backwards  Walks 20 ft, uses assistive device, slower speed, mild gait deviations, deviates 6-10 in outside 12 in walkway width.    Steps  Alternating feet, must use rail.    Total Score  21            Access Code: [email or web address]  URL: [email or web address]  Date: 08/28/2018  Prepared by: Cameron Sprang   Exercises  Figure 4 Bridge - 5 reps - 2 sets - 1-2x daily - 7x weekly  Supine Hip Flexor Stretch with Weight - 3 reps - 1 sets - 60 hold - 1x daily - 7x weekly  Sit to Stand in Stride with AFO and UE Assist in LE Alignment - 10 reps - 1 sets - 1x daily - 7x weekly  Standing Gastroc Stretch - 3 reps - 1 sets - 30 hold - 2x daily - 7x weekly                  PT Education - 08/28/18 1106    Education Details  progressed based on goals, following up with MD regarding appt for botox    Person(s) Educated  Patient    Methods  Explanation     Comprehension  Verbalized understanding       PT Short Term Goals - 07/01/18 1142      PT SHORT TERM GOAL #1   Title  Pt will initiate HEP in order to indicate improved functional mobility and decreased fall risk.  (Target Date: 06/28/18)    Baseline  06/26/18: met with current HEP    Status  Achieved      PT SHORT TERM GOAL #2   Title  Pt will perform 5TSS in </=11 secs without UE support and good B LE weight bearing in order to indicate decreased fall risk.     Baseline  06/26/18: 12.31 sec's without UE support, standard height chair, improved just not to goal    Status  Partially Met      PT SHORT TERM GOAL #3   Title  Pt will improve DGI to >/=17/24 in order to indicate decreased fall risk.     Baseline  06/26/18: 18/24 scored today    Status  Achieved      PT SHORT TERM GOAL #4   Title  Will assess need for RLE AFO and proceed appropriately.     Baseline  06/26/18: have trialed braces in session, awaiting MD order    Status  Achieved      PT SHORT TERM GOAL #5   Title  Pt will ambulate 500' over unlevel outdoor paved surfaces w/ LRAD (brace as needed) at mod I level in order to return to leisure and community activity.     Baseline  07/01/18: Pt able to ambulate 500 ft over unlevel paved surfaces with Allard toe off brace on right at supervision and LOB x1.     Time  4    Period  Weeks    Status  Not Met        PT Long Term Goals - 08/28/18 1107      PT LONG TERM GOAL #1   Title  Pt will be independnet with final HEP in order to indicate improved functional mobility and decreased fall risk.  (New Target Date: 08/30/18)    Baseline  met 08/28/18    Time  8  Period  Weeks    Status  Achieved      PT LONG TERM GOAL #2   Title  Pt will improve DGI to >19/24 in order to indicate decreased fall risk.      Baseline  07/28/18: 20/24 with [place]er brace    Status  Achieved      PT LONG TERM GOAL #3   Title  Pt will increase gait speed to >/=2.62 ft/sec in order to  indictate safe community ambulation and improved efficiency of gait.     Baseline  08/28/18 2.73 ft/sec     Status  Achieved      PT LONG TERM GOAL #4   Title  Pt will ambulate 1000' over varying outdoor surfaces (both paved and grassy, ramp/incline/curb) w/ LRAD (brace as needed) at mod I level in order to indicate safe community negotiation.     Baseline  08/26/18: met today with brace on right LE    Status  Achieved      PT LONG TERM GOAL #5   Title  Will assess FGA and improve score by 3 points from baseline in order to indicate decreased fall risk.     Baseline  17/30 at baseline, 21/30 on 08/28/17    Time  4    Period  Weeks    Status  Achieved            Plan - 08/28/18 1106    Clinical Impression Statement  Skilled session focused on addressing remaining LTGs.  Pt has met 5/5 LTGs and is ready for D/C.  Recommend he follow up with MD regarding appt to get Botox in RUE.      Rehab Potential  Good    Clinical Impairments Affecting Rehab Potential  length of time since CVA    PT Frequency  2x / week    PT Duration  4 weeks    PT Treatment/Interventions  ADLs/Self Care Home Management;Electrical Stimulation;DME Instruction;Gait training;Stair training;Functional mobility training;Therapeutic activities;Therapeutic exercise;Aquatic Therapy;Balance training;Neuromuscular re-education;Cognitive remediation;Patient/family education;Orthotic Fit/Training;Passive range of motion;Energy conservation;Vestibular    PT Next Visit Plan  --    PT Home Exercise Plan  HEP from [place]: [email or web address]    Consulted and Agree with Plan of Care  Patient       Patient will benefit from skilled therapeutic intervention in order to improve the following deficits and impairments:  Abnormal gait, Decreased activity tolerance, Decreased balance, Decreased coordination, Decreased endurance, Decreased knowledge of use of DME, Decreased mobility, Decreased range of motion, Decreased strength, Impaired perceived  functional ability, Impaired flexibility, Postural dysfunction, Improper body mechanics, Impaired UE functional use, Impaired tone, Impaired sensation  Visit Diagnosis: Hemiplegia and hemiparesis following cerebral infarction affecting right dominant side (HCC)  Unsteadiness on feet  Muscle weakness (generalized)  Other abnormalities of gait and mobility  PHYSICAL THERAPY DISCHARGE SUMMARY  Visits from Start of Care: 23  Current functional level related to goals / functional outcomes: See LTGs above   Remaining deficits: Pt with continued R hemiplegia and high level balance deficits.  Has now received his R AFO and has HEP to address deficits.    Education / Equipment: HEP and R AFO  Plan: Patient agrees to discharge.  Patient goals were met. Patient is being discharged due to meeting the stated rehab goals.  ?????         Problem List Patient Active Problem List   Diagnosis Date Noted  . CKD (chronic kidney disease) 12/18/2016  . Benign essential HTN   .  Idiopathic gout   . Diastolic dysfunction   . Cocaine abuse ([place])   . Right hemiparesis ([place])   . Monoplegia of upper extremity following cerebral infarction affecting right dominant side ([place])   . Cerebrovascular accident (CVA) ([place])   . Acute ischemic stroke ([place]) 09/15/2016  . Stroke (cerebrum) ([place]) 09/15/2016  . Hypertension 09/15/2016  . Vitamin B12 deficiency 08/10/2015  . Weakness 07/02/2013  . Abnormal CPK 07/02/2013    Cameron Sprang, PT, MPT [facility] [address] [place], [place], [address] Phone: [phone]   Fax:  [phone] 08/28/18, 1:07 PM  Name: RONALDO CRILLY MRN: [id number] Date of Birth: 04-Aug-1971

## 2018-08-28 NOTE — Patient Instructions (Signed)
Access Code: [email or web address]  URL: [email or web address]  Date: 08/28/2018  Prepared by: Cameron Sprang   Exercises  Figure 4 Bridge - 5 reps - 2 sets - 1-2x daily - 7x weekly  Supine Hip Flexor Stretch with Weight - 3 reps - 1 sets - 60 hold - 1x daily - 7x weekly  Sit to Stand in Stride with AFO and UE Assist in LE Alignment - 10 reps - 1 sets - 1x daily - 7x weekly  Standing Gastroc Stretch - 3 reps - 1 sets - 30 hold - 2x daily - 7x weekly

## 2018-08-29 ENCOUNTER — Encounter: Payer: Self-pay | Admitting: Physical Medicine & Rehabilitation

## 2018-09-01 ENCOUNTER — Ambulatory Visit: Payer: Medicare Other | Admitting: Rehabilitation

## 2018-09-04 ENCOUNTER — Ambulatory Visit: Payer: Medicare Other | Admitting: Rehabilitation

## 2018-09-05 ENCOUNTER — Ambulatory Visit: Payer: Medicare Other | Admitting: Physical Therapy

## 2018-09-26 ENCOUNTER — Encounter: Payer: Self-pay | Admitting: Physical Medicine & Rehabilitation

## 2018-09-26 ENCOUNTER — Ambulatory Visit (HOSPITAL_BASED_OUTPATIENT_CLINIC_OR_DEPARTMENT_OTHER): Payer: Medicare Other | Admitting: Physical Medicine & Rehabilitation

## 2018-09-26 ENCOUNTER — Other Ambulatory Visit: Payer: Self-pay

## 2018-09-26 ENCOUNTER — Encounter: Payer: Medicare Other | Attending: Physical Medicine & Rehabilitation

## 2018-09-26 VITALS — BP 114/78 | HR 85 | Ht 73.0 in | Wt 260.0 lb

## 2018-09-26 DIAGNOSIS — G8111 Spastic hemiplegia affecting right dominant side: Secondary | ICD-10-CM | POA: Insufficient documentation

## 2018-09-26 NOTE — Patient Instructions (Signed)
Botox  What is Botox (Onabotulinumtoxina)?  Botox (Onabotulinumtoxina), also called botulinum toxin type A, is made from the bacteria that causes botulism.  Botulinum toxin blocks nerve activity in the muscles, causing temporary reduction in muscle activity.  Botox is made from human plasma (part of the blood) which may contain viruses and other infectious agents.  Donated plasma is tested and treated to reduce the risk of it containing infectious agents, but there is still a small possibility that it could transmit disease.  Talk with your doctor about the risk and benefits of using this medication.  FDA pregnancy category C.  It is not known whether botulinum toxin will harm an unborn baby.  Tell your doctor if you are pregnant or plan to become pregnant while using this medication.  It is not known whether botulinum toxin passes into breast milk or if it could harm a nursing baby.  Do not receive this medication if you are breastfeeding without discussing with your doctor.  Botox is used to prevent chronic migraine headaches in adults who have migraines for more than 15 days per month, each lasting 4 hours or longer.  Botox should not be used to treat common tension headache.  To make sure you can safely use Botox, tell your doctor if you have any of these conditions:  Amyotrophic lateral sclerosis (ALS or Lou Gehrig's Disease)  Myasthenia Gravis  Breathing disorders  Lambert-Eaton Syndrome  Problems swallowing  Facial muscle weakness  A change in normal appearance of your face  A seizure disorder  Bleeding problems  Heart disease  If you have had or will have surgery, or if you have received other Botulinum toxin injections in the past 4 months    What are the possible side effects of Botox?  **Call your doctor immediately if you have any of these serious side effects which can occur up to several weeks after an injection:  Trouble breathing, talking or  swallowing  Hoarse voice, drooping eyelids  Unusual or severe muscle weakness (especially in a body area that was not injected  with the medication)  Loss of bladder control  Problems with vision  Crusting or drainage from your eyes  Severe skin rash or itching  Fast, slow, or uneven heartbeats  Chest pain or heavy feeling, pain spreading to the arm or shoulder, general ill feeling  Less side effects may include:  Muscle weakness near where the medicine was injected  Bruising, bleeding, pain, redness or swelling where the injection was given  Headache, muscle stiffness, neck or back pain  Fever, cough, sore throat, runny nose, flu like symptoms  Dizziness, drowsiness, tired feeling  Nausea, diarrhea, stomach pain, loss of appetite  Dry mouth, dry eyes, ringing in ears  Increased sweating in areas other than under the arms  Itchy, watery eyes, increased sensitivity to light  Eyelid swelling or bruising  Pre and Post treatment Instructions  Pre Treatment Instructions:  3 days before treatment, avoid topical facial or "anti aging" products.  Avoid waxing, bleaching, tweezing or the use of hair removal cream.  7 days before treatment, avoid blood thinning over the counter medications such as aspirin, Motrin, Aleve and Ibuprofen.  Also avoid herbal supplements such as Garlic, Vitamin E, Ginkgo Biloba, [place] Wart and Omega-3 capsules.  Do not drink alcoholic beverages 24 hours before or after your treatment to avoid bruising.  Inform your provider if you have perioral herpes to receive advice on antiviral therapy prior to treatment.  Day of   Treatment:  Arrive to office with a "clean face".  Do not wear make-up.  You may experience a mild amount of tenderness or a stinging sensation following injection.  Redness and swelling are normal.  Some bruising may also be visible.  You may experience some tenderness at the treatment site that can last for a few hours  or a few days.  Immediately after treatment:  It is best to try to exercise your treated muscles for 1-2 hours after treatment (practice frowning, raising your eyebrows and squinting).  This helps to work the BOTOX into your muscles.  Stay in a vertical position for four hours following injections.  Do not "rest your head" or lie down.  Sit upright.  You may apply an ice or cold gel pack to the area as this helps reduce swelling and the potential for bruising.  Once you have iced the area and any pinpoint bleeding from the injection site has subsided, you may begin wearing makeup.  Avoid placing excessive pressure on the treated areas for the first few days; when cleansing your face or applying make-up, be very gentle.  Avoid exercise or strenuous activities for the remainder of the treatment day; you may resume other normal activities/routines immediately.  You may take Acetaminophen/Tylenol if you experience any mild tenderness or discomfort.  Avoid extended UV exposure until any redness/swelling has subsided.  Be sure to apply an SPF 30 or higher sunscreen.  Wait a minimum of 24 hours (or as directed by your provider) before receiving any skin care or laser treatment.  Please contact us at [phone] if you have any questions or concerns    

## 2018-09-26 NOTE — Progress Notes (Signed)
Subjective:    Patient ID: Randall Mccall, male    DOB: 12-20-1970, 48 y.o.   MRN: [id number]  HPI L PLIC-  Infarct 4/62/7035 with Chronic Right Hemiparesis, risk factors include hypertension as well as cocaine abuse. The patient underwent neurological evaluation and was transferred to a skilled nursing facility on 09/18/2016.  Patient remained in the skilled nursing facility until 10/24/2016 and was discharged with a quad cane, shower chair and 3 in 1 commode. Review of chart also indicates history of gout with several ED visits.  Multiple joints have been involved including the right knee left wrist and left foot.  Patient was treated with prednisone and opioid analgesics Patient has been receiving outpatient physical therapy and occupational therapy since 05/29/2018, completed therapy on 08/28/2018 Patient referred for the evaluation of right upper extremity spasms.  The patient states that he has some mild shoulder pain no elbow pain no hand or wrist pain.  He has difficulty turning his hand palm up as well as extending his fingers. Pain Inventory Average Pain 6 Pain Right Now 0 My pain is numbness  In the last 24 hours, has pain interfered with the following? General activity 0 Relation with others 0 Enjoyment of life 0 What TIME of day is your pain at its worst? varies Sleep (in general) Good  Pain is worse with: walking and standing Pain improves with: sit down Relief from Meds: 10  Mobility walk without assistance ability to climb steps?  yes do you drive?  no  Function disabled: date disabled na  Neuro/Psych weakness numbness  Prior Studies Any changes since last visit?  no MRI HEAD FINDINGS  Brain: 9 x 5 mm focus of diffusion restriction consistent with acute infarct within the left posterior limb of internal capsule. Foci of T2 FLAIR hyperintense signal abnormality in subcortical and periventricular white matter spine nonspecific but probably represent advanced  chronic microvascular ischemic changes for patient's age. Chronic hemosiderin stained lacunar infarct within the left caudate head.  Vascular: As below.  Skull and upper cervical spine: Normal marrow signal.  Sinuses/Orbits: Negative.  Other: None.  MRA HEAD FINDINGS  Internal carotid arteries:  Patent.  Anterior cerebral arteries:  Patent.  Middle cerebral arteries: Patent. Mild-to-moderate stenosis of left M2 superior division origin (series 951, image 11). The segment of stenosis is less conspicuous when compared with the prior CTA of the head.  Anterior communicating artery: Patent.  Posterior communicating arteries: Patent large left and small right.  Posterior cerebral arteries:  Patent.  Basilar artery: Mild proximal stenosis. Tortuous course with large rightward loop.  Vertebral arteries:  Patent bilaterally.  No evidence of high-grade stenosis, large vessel occlusion, or aneurysm unless noted above.  IMPRESSION: 1. Subcentimeter focus of acute infarction within the left posterior limb of internal capsule. No acute hemorrhage identified. 2. Nonspecific foci of T2 FLAIR hyperintense signal abnormality in white matter probably represents advanced chronic microvascular ischemic changes for patient's age. Chronic lacunar infarct within the left caudate head. 3. Patent circle of Willis without evidence for high-grade stenosis, large vessel occlusion or aneurysm. 4. Long segment of mild-to-moderate stenosis of left M2 prox superior division, less conspicuous when compared with prior CT angiogram of the head.   Electronically Signed   By: Kristine Garbe M.D.   On: 09/16/2016 01:13  Physicians involved in your care Primary care [facility] Neurologist [facility] Neurologist   Family History  Problem Relation Age of Onset  . Hypertension Mother   . Cirrhosis  Father    Social History   Socioeconomic History  . Marital  status: Single    Spouse name: Not on file  . Number of children: 0  . Years of education: 68  . Highest education level: Not on file  Occupational History    Comment: Disabled  Social Needs  . Financial resource strain: Not on file  . Food insecurity:    Worry: Not on file    Inability: Not on file  . Transportation needs:    Medical: Not on file    Non-medical: Not on file  Tobacco Use  . Smoking status: Never Smoker  . Smokeless tobacco: Never Used  Substance and Sexual Activity  . Alcohol use: Yes    Alcohol/week: 1.0 standard drinks    Types: 1 Cans of beer per week  . Drug use: No  . Sexual activity: Not on file  Lifestyle  . Physical activity:    Days per week: Not on file    Minutes per session: Not on file  . Stress: Not on file  Relationships  . Social connections:    Talks on phone: Not on file    Gets together: Not on file    Attends religious service: Not on file    Active member of club or organization: Not on file    Attends meetings of clubs or organizations: Not on file    Relationship status: Not on file  Other Topics Concern  . Not on file  Social History Narrative   Patient lives alone and he is single.   Right handed.   Caffeine-    Education- 12 th grade   Disabled.   Past Surgical History:  Procedure Laterality Date  . KNEE SURGERY     Past Medical History:  Diagnosis Date  . Gout   . Hypertension   . Muscle weakness   . Stroke (HCC)    BP 114/78   Pulse 85   Ht 6\' 1"  (1.854 m)   Wt 260 lb (117.9 kg)   SpO2 96%   BMI 34.30 kg/m   Opioid Risk Score:   Fall Risk Score:  `1  Depression screen PHQ 2/9  Depression screen [facility] 2/9 09/26/2018 03/20/2018 03/03/2018  Decreased Interest 0 0 0  Down, Depressed, Hopeless 0 0 1  PHQ - 2 Score 0 0 1    Review of Systems  Constitutional: Negative.   HENT: Negative.   Eyes: Negative.   Respiratory: Negative.   Cardiovascular: Negative.   Gastrointestinal: Negative.   Endocrine:  Negative.   Genitourinary: Negative.   Musculoskeletal: Positive for myalgias.  Skin: Negative.   Allergic/Immunologic: Negative.   Neurological: Positive for weakness and numbness.  Hematological: Negative.   Psychiatric/Behavioral: Negative.   All other systems reviewed and are negative.      Objective:   Physical Exam Vitals signs and nursing note reviewed.  Constitutional:      Appearance: Normal appearance. He is normal weight.  HENT:     Head: Normocephalic and atraumatic.     Nose: Nose normal.     Mouth/Throat:     Mouth: Mucous membranes are moist.  Eyes:     Extraocular Movements: Extraocular movements intact.     Pupils: Pupils are equal, round, and reactive to light.  Neck:     Musculoskeletal: Normal range of motion.  Cardiovascular:     Heart sounds: Murmur present.  Pulmonary:     Effort: No respiratory distress.     Breath  sounds: No stridor.  Musculoskeletal:     Right shoulder: He exhibits decreased range of motion. He exhibits no tenderness and no deformity.     Left shoulder: Normal.     Right elbow: He exhibits decreased range of motion. He exhibits no effusion.     Left elbow: Normal.     Right wrist: He exhibits decreased range of motion. He exhibits no tenderness and no effusion.     Left wrist: Normal.  Skin:    General: Skin is warm and dry.  Neurological:     Mental Status: He is alert.     Comments: Motor strength is 5/5 in the left deltoid bicep tricep grip hip flexor knee extensor ankle dorsiflexor Right upper extremity 3- at the deltoid 3 at the biceps triceps 3- right finger flexors 0 finger extensors trace thumb flexion extension 3- supination full pronation tone assisted. Right lower extremity 4 at the hip flexors 5 at the knee extensors 3- ankle dorsiflexion and plantarflexion Tone MAS 1 at the pectoralis MAS 1 at the biceps MAS 3 at the pronators MAS 3 at the ankle flexors MAS 1 at the thumb flexor MAS 3 at the wrist flexors  particularly FCR Ambulates without assistive device he has a tendency towards toe drag but is able to clear his foot on the right side. Mood and affect are flat no impulsivity noted Visual fields are intact            Assessment & Plan:  1.  Right spastic hemiplegia secondary left posterior limb of the internal capsule infarct onset in January 2018.  He has completed outpatient rehabilitation but has continued problems with increased tone particularly in the finger wrist flexors and pronators in the right upper extremity. Because of the focal nature of his spasticity recommending botulinum toxin injection given his inability to perform finger extension and supination.  He is also at risk for contracture at the finger flexors which would make hand hygiene difficult. Recommend the following dosing  FCR 50 FDS 50 FDP 50 PT 25 PQ 25

## 2018-10-10 DIAGNOSIS — I672 Cerebral atherosclerosis: Secondary | ICD-10-CM | POA: Diagnosis not present

## 2018-10-10 DIAGNOSIS — I69351 Hemiplegia and hemiparesis following cerebral infarction affecting right dominant side: Secondary | ICD-10-CM | POA: Diagnosis not present

## 2018-10-10 DIAGNOSIS — E785 Hyperlipidemia, unspecified: Secondary | ICD-10-CM | POA: Diagnosis not present

## 2018-10-10 DIAGNOSIS — I10 Essential (primary) hypertension: Secondary | ICD-10-CM | POA: Diagnosis not present

## 2018-10-24 ENCOUNTER — Other Ambulatory Visit: Payer: Self-pay

## 2018-10-24 ENCOUNTER — Ambulatory Visit (HOSPITAL_BASED_OUTPATIENT_CLINIC_OR_DEPARTMENT_OTHER): Payer: Medicare Other | Admitting: Physical Medicine & Rehabilitation

## 2018-10-24 ENCOUNTER — Encounter: Payer: Medicare Other | Attending: Physical Medicine & Rehabilitation

## 2018-10-24 ENCOUNTER — Encounter: Payer: Self-pay | Admitting: Physical Medicine & Rehabilitation

## 2018-10-24 VITALS — BP 122/84 | HR 89 | Ht 73.0 in | Wt 260.0 lb

## 2018-10-24 DIAGNOSIS — G8111 Spastic hemiplegia affecting right dominant side: Secondary | ICD-10-CM | POA: Diagnosis not present

## 2018-10-24 NOTE — Progress Notes (Signed)
Botox Injection for spasticity using needle EMG guidance  Dilution: 50 Units/ml Indication: Severe spasticity which interferes with ADL,mobility and/or  hygiene and is unresponsive to medication management and other conservative care Informed consent was obtained after describing risks and benefits of the procedure with the patient. This includes bleeding, bruising, infection, excessive weakness, or medication side effects. A REMS form is on file and signed. Needle: 27g 1" needle electrode Number of units per muscle  FCR50 FCU0 FDS50 FDP50 FPL0 PQ 25 PT 25 All injections were done after obtaining appropriate EMG activity and after negative drawback for blood. The patient tolerated the procedure well. Post procedure instructions were given. A followup appointment was made.  

## 2018-10-24 NOTE — Patient Instructions (Signed)

## 2018-11-10 DIAGNOSIS — Z0110 Encounter for examination of ears and hearing without abnormal findings: Secondary | ICD-10-CM | POA: Diagnosis not present

## 2018-11-10 DIAGNOSIS — Z0100 Encounter for examination of eyes and vision without abnormal findings: Secondary | ICD-10-CM | POA: Diagnosis not present

## 2018-11-10 DIAGNOSIS — E7800 Pure hypercholesterolemia, unspecified: Secondary | ICD-10-CM | POA: Diagnosis not present

## 2018-11-10 DIAGNOSIS — Z0000 Encounter for general adult medical examination without abnormal findings: Secondary | ICD-10-CM | POA: Diagnosis not present

## 2018-11-10 DIAGNOSIS — Z131 Encounter for screening for diabetes mellitus: Secondary | ICD-10-CM | POA: Diagnosis not present

## 2018-12-08 ENCOUNTER — Encounter: Payer: Self-pay | Admitting: Physical Medicine & Rehabilitation

## 2018-12-08 ENCOUNTER — Other Ambulatory Visit: Payer: Self-pay

## 2018-12-08 ENCOUNTER — Ambulatory Visit (HOSPITAL_BASED_OUTPATIENT_CLINIC_OR_DEPARTMENT_OTHER): Payer: Medicare Other | Admitting: Physical Medicine & Rehabilitation

## 2018-12-08 ENCOUNTER — Encounter: Payer: Medicare Other | Attending: Physical Medicine & Rehabilitation

## 2018-12-08 VITALS — BP 129/56 | HR 78 | Ht 73.0 in | Wt 260.0 lb

## 2018-12-08 DIAGNOSIS — G8111 Spastic hemiplegia affecting right dominant side: Secondary | ICD-10-CM

## 2018-12-08 NOTE — Progress Notes (Signed)
Subjective:  Virtual visit via phone with pt  Patient ID: Randall Mccall, male    DOB: [birth year], 48 y.o.   MRN: [id number] COVID Risk score 3, moderate HPI  Botox 2/28 FCR50 FCU0 FDS50 FDP50 FPL0 PQ 25 PT 25  Fingers and wrist doing better post injection  Not using a cane or walker  No falls  Finished with HH and Outpt therapy  Doing HEP for Arm and Leg  Pain Inventory Average Pain 0 Pain Right Now 0 My pain is na  In the last 24 hours, has pain interfered with the following? General activity 0 Relation with others 0 Enjoyment of life 0 What TIME of day is your pain at its worst? na Sleep (in general) Good  Pain is worse with: na Pain improves with: na Relief from Meds: na  Mobility ability to climb steps?  yes do you drive?  no  Function disabled: date disabled na  Neuro/Psych No problems in this area  Prior Studies Any changes since last visit?  no  Physicians involved in your care Any changes since last visit?  no   Family History  Problem Relation Age of Onset  . Hypertension Mother   . Cirrhosis Father    Social History   Socioeconomic History  . Marital status: Single    Spouse name: Not on file  . Number of children: 0  . Years of education: 30  . Highest education level: Not on file  Occupational History    Comment: Disabled  Social Needs  . Financial resource strain: Not on file  . Food insecurity:    Worry: Not on file    Inability: Not on file  . Transportation needs:    Medical: Not on file    Non-medical: Not on file  Tobacco Use  . Smoking status: Never Smoker  . Smokeless tobacco: Never Used  Substance and Sexual Activity  . Alcohol use: Yes    Alcohol/week: 1.0 standard drinks    Types: 1 Cans of beer per week  . Drug use: No  . Sexual activity: Not on file  Lifestyle  . Physical activity:    Days per week: Not on file    Minutes per session: Not on file  . Stress: Not on file  Relationships  . Social  connections:    Talks on phone: Not on file    Gets together: Not on file    Attends religious service: Not on file    Active member of club or organization: Not on file    Attends meetings of clubs or organizations: Not on file    Relationship status: Not on file  Other Topics Concern  . Not on file  Social History Narrative   Patient lives alone and he is single.   Right handed.   Caffeine-    Education- 12 th grade   Disabled.   Past Surgical History:  Procedure Laterality Date  . KNEE SURGERY     Past Medical History:  Diagnosis Date  . Gout   . Hypertension   . Muscle weakness   . Stroke ([place])    BP (!) 129/56 Comment: pt reported, virtual visit  Pulse 78 Comment: pt reported, virtual visit  Ht 6\' 1"  (1.854 m)   Wt 260 lb (117.9 kg)   BMI 34.30 kg/m   Opioid Risk Score:   Fall Risk Score:  `1  Depression screen [facility] 2/9  Depression screen [facility] 2/9 12/08/2018 10/24/2018 09/26/2018 03/20/2018 03/03/2018  Decreased Interest 0 0 0 0 0  Down, Depressed, Hopeless - 0 0 0 1  PHQ - 2 Score 0 0 0 0 1    Review of Systems  Constitutional: Negative.   HENT: Negative.   Eyes: Negative.   Respiratory: Negative.   Gastrointestinal: Negative.   Endocrine: Negative.   Genitourinary: Negative.   Musculoskeletal: Negative.   Skin: Negative.   Allergic/Immunologic: Negative.   Neurological: Negative.   Hematological: Negative.   Psychiatric/Behavioral: Negative.   All other systems reviewed and are negative.      Objective:   Physical Exam  Virtual visit  Speech without dysarthria or aphasia      Assessment & Plan:  1.  L PLIC-  Infarct 09/07/1622 with Chronic Right spastic Hemiparesis,  The patient has had good results with botulinum toxin 200 units for spasticity management. We will continue with same muscle group selection as well as total dosage  We discussed the usual duration of response.  We would expect this to start wearing off the middle of May and we  would reinject the beginning of June Patient will continue his home exercise program for now

## 2018-12-15 DIAGNOSIS — R7303 Prediabetes: Secondary | ICD-10-CM | POA: Diagnosis not present

## 2018-12-15 DIAGNOSIS — I10 Essential (primary) hypertension: Secondary | ICD-10-CM | POA: Diagnosis not present

## 2018-12-15 DIAGNOSIS — Z0001 Encounter for general adult medical examination with abnormal findings: Secondary | ICD-10-CM | POA: Diagnosis not present

## 2018-12-15 DIAGNOSIS — Z1389 Encounter for screening for other disorder: Secondary | ICD-10-CM | POA: Diagnosis not present

## 2018-12-15 DIAGNOSIS — E7800 Pure hypercholesterolemia, unspecified: Secondary | ICD-10-CM | POA: Diagnosis not present

## 2018-12-15 DIAGNOSIS — M1009 Idiopathic gout, multiple sites: Secondary | ICD-10-CM | POA: Diagnosis not present

## 2019-01-05 DIAGNOSIS — E785 Hyperlipidemia, unspecified: Secondary | ICD-10-CM | POA: Diagnosis not present

## 2019-01-05 DIAGNOSIS — I69351 Hemiplegia and hemiparesis following cerebral infarction affecting right dominant side: Secondary | ICD-10-CM | POA: Diagnosis not present

## 2019-01-05 DIAGNOSIS — I10 Essential (primary) hypertension: Secondary | ICD-10-CM | POA: Diagnosis not present

## 2019-01-05 DIAGNOSIS — I672 Cerebral atherosclerosis: Secondary | ICD-10-CM | POA: Diagnosis not present

## 2019-01-26 ENCOUNTER — Encounter: Payer: Medicare Other | Attending: Physical Medicine & Rehabilitation | Admitting: Physical Medicine & Rehabilitation

## 2019-01-26 ENCOUNTER — Other Ambulatory Visit: Payer: Self-pay

## 2019-01-26 ENCOUNTER — Encounter: Payer: Self-pay | Admitting: Physical Medicine & Rehabilitation

## 2019-01-26 VITALS — BP 116/72 | HR 81 | Resp 14 | Ht 73.0 in | Wt 254.0 lb

## 2019-01-26 DIAGNOSIS — G8111 Spastic hemiplegia affecting right dominant side: Secondary | ICD-10-CM | POA: Insufficient documentation

## 2019-01-26 NOTE — Progress Notes (Signed)
Botox Injection for spasticity using needle EMG guidance  Dilution: 50 Units/ml Indication: Severe spasticity which interferes with ADL,mobility and/or  hygiene and is unresponsive to medication management and other conservative care Informed consent was obtained after describing risks and benefits of the procedure with the patient. This includes bleeding, bruising, infection, excessive weakness, or medication side effects. A REMS form is on file and signed. Needle: 27g 1" needle electrode Number of units per muscle  FCR50 FCU0 FDS50 FDP50 FPL0 PQ 25 PT 25 All injections were done after obtaining appropriate EMG activity and after negative drawback for blood. The patient tolerated the procedure well. Post procedure instructions were given. A followup appointment was made.

## 2019-01-26 NOTE — Patient Instructions (Signed)
FCR50 FCU0 FDS50 FDP50 FPL0 PQ 25 PT 25

## 2019-02-11 DIAGNOSIS — M1009 Idiopathic gout, multiple sites: Secondary | ICD-10-CM | POA: Diagnosis not present

## 2019-02-11 DIAGNOSIS — Z0001 Encounter for general adult medical examination with abnormal findings: Secondary | ICD-10-CM | POA: Diagnosis not present

## 2019-02-11 DIAGNOSIS — E7800 Pure hypercholesterolemia, unspecified: Secondary | ICD-10-CM | POA: Diagnosis not present

## 2019-02-11 DIAGNOSIS — I10 Essential (primary) hypertension: Secondary | ICD-10-CM | POA: Diagnosis not present

## 2019-02-11 DIAGNOSIS — Z131 Encounter for screening for diabetes mellitus: Secondary | ICD-10-CM | POA: Diagnosis not present

## 2019-02-11 DIAGNOSIS — R7303 Prediabetes: Secondary | ICD-10-CM | POA: Diagnosis not present

## 2019-03-09 ENCOUNTER — Encounter: Payer: Self-pay | Admitting: Physical Medicine & Rehabilitation

## 2019-03-09 ENCOUNTER — Encounter: Payer: Medicare Other | Attending: Physical Medicine & Rehabilitation | Admitting: Physical Medicine & Rehabilitation

## 2019-03-09 ENCOUNTER — Other Ambulatory Visit: Payer: Self-pay

## 2019-03-09 VITALS — BP 121/86 | HR 86 | Temp 97.5°F | Ht 73.0 in | Wt 252.0 lb

## 2019-03-09 DIAGNOSIS — G8111 Spastic hemiplegia affecting right dominant side: Secondary | ICD-10-CM | POA: Insufficient documentation

## 2019-03-09 NOTE — Patient Instructions (Addendum)
Will do same dose and muscles as last time

## 2019-03-09 NOTE — Progress Notes (Signed)
Subjective:    Patient ID: Randall Mccall, male    DOB: 03-14-1971, 48 y.o.   MRN: [id number]  HPI 48 year old male with history of left PLI C infarct 09/15/2016 with chronic right spastic hemiplegia.  His risk factors included hypertension as well as history of cocaine abuse.  He was transferred to a skilled nursing facility after his stroke where he stayed for approximately 5 weeks.  He has been functioning at a modified independent level in a community setting.  He is completed his outpatient therapy in January 2020.  He has been treated at this clinic for right upper extremity spasms, prior Botox injection was October 24, 2018  Botox 01/26/2019, patient found this to be quite helpful in terms of loosening up his right arm.  FCR50 FCU0 FDS50 FDP50 FPL0 PQ 25 PT 25  Now able to tie shoes!  No post procedure swelling or bruising Pain Inventory Average Pain 0 Pain Right Now 0 My pain is no pain  In the last 24 hours, has pain interfered with the following? General activity 0 Relation with others 0 Enjoyment of life 0 What TIME of day is your pain at its worst? no pain Sleep (in general) Good  Pain is worse with: no pain Pain improves with: no pain Relief from Meds: no pain  Mobility ability to climb steps?  no do you drive?  no  Function disabled: date disabled na  Neuro/Psych trouble walking  Prior Studies Any changes since last visit?  no  Physicians involved in your care Any changes since last visit?  no   Family History  Problem Relation Age of Onset  . Hypertension Mother   . Cirrhosis Father    Social History   Socioeconomic History  . Marital status: Single    Spouse name: Not on file  . Number of children: 0  . Years of education: 3  . Highest education level: Not on file  Occupational History    Comment: Disabled  Social Needs  . Financial resource strain: Not on file  . Food insecurity    Worry: Not on file    Inability: Not on file   . Transportation needs    Medical: Not on file    Non-medical: Not on file  Tobacco Use  . Smoking status: Never Smoker  . Smokeless tobacco: Never Used  Substance and Sexual Activity  . Alcohol use: Yes    Alcohol/week: 1.0 standard drinks    Types: 1 Cans of beer per week  . Drug use: No  . Sexual activity: Not on file  Lifestyle  . Physical activity    Days per week: Not on file    Minutes per session: Not on file  . Stress: Not on file  Relationships  . Social Herbalist on phone: Not on file    Gets together: Not on file    Attends religious service: Not on file    Active member of club or organization: Not on file    Attends meetings of clubs or organizations: Not on file    Relationship status: Not on file  Other Topics Concern  . Not on file  Social History Narrative   Patient lives alone and he is single.   Right handed.   Caffeine-    Education- 12 th grade   Disabled.   Past Surgical History:  Procedure Laterality Date  . KNEE SURGERY     Past Medical History:  Diagnosis Date  .  Gout   . Hypertension   . Muscle weakness   . Stroke (HCC)    BP 121/86   Pulse 86   Temp (!) 97.5 F (36.4 C)   Ht 6\' 1"  (1.854 m)   Wt 252 lb (114.3 kg)   SpO2 96%   BMI 33.25 kg/m   Opioid Risk Score:   Fall Risk Score:  `1  Depression screen PHQ 2/9  Depression screen [facility] 2/9 03/09/2019 12/08/2018 10/24/2018 09/26/2018 03/20/2018 03/03/2018  Decreased Interest 0 0 0 0 0 0  Down, Depressed, Hopeless 0 - 0 0 0 1  PHQ - 2 Score 0 0 0 0 0 1    Review of Systems  Constitutional: Negative.   HENT: Negative.   Eyes: Negative.   Respiratory: Negative.   Cardiovascular: Negative.   Gastrointestinal: Negative.   Endocrine: Negative.   Genitourinary: Negative.   Musculoskeletal: Positive for gait problem.  Skin: Negative.   Allergic/Immunologic: Negative.   Hematological: Negative.   Psychiatric/Behavioral: Negative.   All other systems reviewed and are  negative.      Objective:   Physical Exam Vitals signs and nursing note reviewed.  Constitutional:      Appearance: Normal appearance.  HENT:     Head: Normocephalic and atraumatic.     Nose: Nose normal.  Eyes:     Extraocular Movements: Extraocular movements intact.     Conjunctiva/sclera: Conjunctivae normal.     Pupils: Pupils are equal, round, and reactive to light.  Skin:    General: Skin is warm and dry.  Neurological:     General: No focal deficit present.     Mental Status: He is alert and oriented to person, place, and time.     Comments: Motor strength is 3- in the right deltoid biceps triceps finger flexors and extensors 4/5 in the right hip flexor knee extensor ankle dorsiflexor 5/5 on the left side in the upper and lower limb. There is decreased fine motor control in the right hand. Tone, right upper extremity MAS 2 at the elbow flexors MAS 1 at the finger and wrist flexors MAS 1 in thumb flexor MAS 1 in the pronator muscle groups  Psychiatric:        Mood and Affect: Mood normal.           Assessment & Plan:  1.  Right spastic hemiplegia with improvement in tone, targeted mainly the finger and wrist flexors as well as the pronators in the right upper limb.  He has also improved his functional usage of the right upper extremity and is able to tie his shoes although it is a bit difficult for him. As discussed with the patient would recommend continuing the same dosing as well as muscle group selection and repeat his botulinum toxin injection in approximately 6 weeks.  Discussed with patient agrees with plan  FCR50 FCU0 FDS50 FDP50 FPL0 PQ 25 PT 25

## 2019-03-17 ENCOUNTER — Encounter (HOSPITAL_COMMUNITY): Payer: Self-pay | Admitting: Emergency Medicine

## 2019-03-17 ENCOUNTER — Emergency Department (HOSPITAL_COMMUNITY)
Admission: EM | Admit: 2019-03-17 | Discharge: 2019-03-17 | Disposition: A | Discharge status: Home / Self Care | Payer: Medicare Other | Attending: Emergency Medicine | Admitting: Emergency Medicine

## 2019-03-17 ENCOUNTER — Other Ambulatory Visit: Payer: Self-pay

## 2019-03-17 DIAGNOSIS — N189 Chronic kidney disease, unspecified: Secondary | ICD-10-CM | POA: Insufficient documentation

## 2019-03-17 DIAGNOSIS — K047 Periapical abscess without sinus: Secondary | ICD-10-CM | POA: Diagnosis not present

## 2019-03-17 DIAGNOSIS — Z79899 Other long term (current) drug therapy: Secondary | ICD-10-CM | POA: Insufficient documentation

## 2019-03-17 DIAGNOSIS — I129 Hypertensive chronic kidney disease with stage 1 through stage 4 chronic kidney disease, or unspecified chronic kidney disease: Secondary | ICD-10-CM | POA: Insufficient documentation

## 2019-03-17 DIAGNOSIS — R600 Localized edema: Secondary | ICD-10-CM | POA: Diagnosis present

## 2019-03-17 MED ORDER — PENICILLIN V POTASSIUM 500 MG PO TABS: 500.0000 mg | ORAL_TABLET | Freq: Four times a day (QID) | ORAL | 0 refills | Status: AC

## 2019-03-17 NOTE — ED Triage Notes (Signed)
Pt reports waking up and noticing what he thinks is an abscess in the left upper jaw. Denies pain, reports inability to chew on that side. Round "knot" noted near molars.

## 2019-03-17 NOTE — ED Provider Notes (Addendum)
[facility] EMERGENCY DEPARTMENT Provider Note   CSN: [id number] Arrival date & time: 03/17/19  1214   History   Chief Complaint No chief complaint on file.   HPI Randall Mccall is a 48 y.o. male.     HPI   48 year old male presents today with complaints of intraoral swelling.  Patient notes that along his left upper inner dentition he has swelling.  He denies any associated pain discharge fever or any other complaints.  No other concerns no medications prior to arrival.     Past Medical History:  Diagnosis Date  . Gout   . Hypertension   . Muscle weakness   . Stroke [facility])     Patient Active Problem List   Diagnosis Date Noted  . CKD (chronic kidney disease) 12/18/2016  . Benign essential HTN   . Idiopathic gout   . Diastolic dysfunction   . Cocaine abuse ([place])   . Spastic hemiparesis of right dominant side ([place])   . 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 Cerebrovascular accident (CVA) ([place])   . Acute ischemic stroke ([place]) 09/15/2016  . Stroke (cerebrum) ([place]) 09/15/2016  . Hypertension 09/15/2016  . Vitamin B12 deficiency 08/10/2015  . Weakness 07/02/2013  . Abnormal CPK 07/02/2013    Past Surgical History:  Procedure Laterality Date  . KNEE SURGERY          Home Medications    Prior to Admission medications   Medication Sig Start Date End Date Taking? Authorizing Provider  allopurinol (ZYLOPRIM) 100 MG tablet Take 1 tablet (100 mg total) by mouth daily. 10/16/17   Daleen Bo, MD  amLODipine (NORVASC) 10 MG tablet Take 1 tablet (10 mg total) by mouth daily. 06/15/16   Elnora Morrison, MD  aspirin 325 MG tablet Take 1 tablet (325 mg total) by mouth daily. 09/18/16   Nita Sells, MD  atorvastatin (LIPITOR) 80 MG tablet Take 1 tablet (80 mg total) by mouth daily at 6 PM. 09/17/16   Nita Sells, MD  Cholecalciferol (VITAMIN D3) 5000 units TABS Take 1 tablet by  mouth daily. 09/25/17   [provider]  cloNIDine (CATAPRES) 0.1 MG tablet Take 1 tablet (0.1 mg total) by mouth 2 (two) times daily. 06/15/16   Elnora Morrison, MD  colchicine 0.6 MG tablet Take 1 tablet (0.6 mg total) by mouth every hour as needed. Up to 4 in one 24-hour period, if needed for pain 10/16/17   Daleen Bo, MD  gabapentin (NEURONTIN) 300 MG capsule Take 300 mg by mouth at bedtime. 10/09/17   [provider]  HYDROcodone-acetaminophen (NORCO/VICODIN) 5-325 MG tablet Take 1 tablet by mouth every 4 (four) hours as needed. 02/20/18   Larene Pickett, PA-C  oxyCODONE-acetaminophen (PERCOCET/ROXICET) 5-325 MG tablet Take 1 tablet by mouth every 8 (eight) hours as needed for severe pain. 05/20/18   Khatri, Hina, PA-C  penicillin v potassium (VEETID) 500 MG tablet Take 1 tablet (500 mg total) by mouth 4 (four) times daily for 7 days. 03/17/19 03/24/19  Hedges, Dellis Filbert, PA-C  predniSONE (STERAPRED UNI-PAK 21 TAB) 10 MG (21) TBPK tablet Take by mouth daily. Take 6 tabs by mouth daily  for 1 days, then 5 tabs for 2 days, then 4 tabs for 2 days, then 3 tabs for 2 days, 2 tabs for 2 days, then 1 tab by mouth daily for 2 days 05/20/18   Shelly Coss, Hina, PA-C  senna-docusate (SENOKOT-S) 8.6-50 MG tablet Take  1 tablet by mouth at bedtime as needed for mild constipation. 09/17/16   Nita Sells, MD    Family History Family History  Problem Relation Age of Onset  . Hypertension Mother   . Cirrhosis Father     Social History Social History   Tobacco Use  . Smoking status: Never Smoker  . Smokeless tobacco: Never Used  Substance Use Topics  . Alcohol use: Yes    Alcohol/week: 1.0 standard drinks    Types: 1 Cans of beer per week    Comment: occ  . Drug use: No     Allergies   Patient has no known allergies.   Review of Systems Review of Systems  All other systems reviewed and are negative.   Physical Exam Updated Vital Signs BP 123/89   Pulse 79   SpO2 95%    Physical Exam Vitals signs and nursing note reviewed.  Constitutional:      Appearance: He is well-developed.  HENT:     Head: Normocephalic and atraumatic.     Comments: Small area of fluctuance noted along the left upper palate along the dentition no discharge gumline palpated without swelling or edema Eyes:     General: No scleral icterus.       Right eye: No discharge.        Left eye: No discharge.     Conjunctiva/sclera: Conjunctivae normal.     Pupils: Pupils are equal, round, and reactive to light.  Neck:     Musculoskeletal: Normal range of motion.     Vascular: No JVD.     Trachea: No tracheal deviation.  Pulmonary:     Effort: Pulmonary effort is normal.     Breath sounds: No stridor.  Neurological:     Mental Status: He is alert and oriented to person, place, and time.     Coordination: Coordination normal.  Psychiatric:        Behavior: Behavior normal.        Thought Content: Thought content normal.        Judgment: Judgment normal.     ED Treatments / Results  Labs (all labs ordered are listed, but only abnormal results are displayed) Labs Reviewed - No data to display  EKG None  Radiology No results found.  Procedures .Marland KitchenIncision and Drainage  Date/Time: 03/17/2019 2:49 PM Performed by: Okey Regal, PA-C Authorized by: Okey Regal, PA-C   Consent:    Consent obtained:  Verbal   Consent given by:  Patient   Risks discussed:  Bleeding, damage to other organs, incomplete drainage, pain and infection   Alternatives discussed:  No treatment, delayed treatment and alternative treatment Location:    Type:  Abscess   Size:  1   Location:  Mouth   Mouth location:  Palate Anesthesia (see MAR for exact dosages):    Anesthesia method:  Local infiltration   Local anesthetic:  Bupivacaine 0.25% WITH epi Procedure type:    Complexity:  Simple Procedure details:    Needle aspiration: no     Incision types:  Stab incision   Incision depth:   Dermal   Scalpel blade:  11   Wound management:  Probed and deloculated   Drainage:  Purulent   Drainage amount:  Moderate   Wound treatment:  Wound left open   Packing materials:  None   (including critical care time)  Medications Ordered in ED Medications - No data to display   Initial Impression / Assessment and Plan / ED  Course  I have reviewed the triage vital signs and the nursing notes.  Pertinent labs & imaging results that were available during my care of the patient were reviewed by me and considered in my medical decision making (see chart for details).        :   Assessment/Plan: 48 year old male presents today with dental abscess.  I&D successful.  Discharged home with penicillin, strict return precautions and follow-up information.  He verbalized understanding and agreement to today's plan had no further questions or concerns at time of discharge.   Final Clinical Impressions(s) / ED Diagnoses   Final diagnoses:  Dental abscess    ED Discharge Orders         Ordered    penicillin v potassium (VEETID) 500 MG tablet  4 times daily     03/17/19 1444           Okey Regal, PA-C 03/17/19 1450    Hedges, Dellis Filbert, PA-C 03/19/19 1256    Julianne Rice, MD 03/20/19 2208

## 2019-03-17 NOTE — ED Notes (Signed)
Discharge instructions and prescription discussed with Pt. Pt verbalized understanding. Pt stable and ambulatory.    

## 2019-03-17 NOTE — Discharge Instructions (Addendum)
Please read attached information. If you experience any new or worsening signs or symptoms please return to the emergency room for evaluation. Please follow-up with your primary care provider or specialist as discussed. Please use medication prescribed only as directed and discontinue taking if you have any concerning signs or symptoms.   °

## 2019-04-01 DIAGNOSIS — I69351 Hemiplegia and hemiparesis following cerebral infarction affecting right dominant side: Secondary | ICD-10-CM | POA: Diagnosis not present

## 2019-04-01 DIAGNOSIS — E785 Hyperlipidemia, unspecified: Secondary | ICD-10-CM | POA: Diagnosis not present

## 2019-04-01 DIAGNOSIS — I10 Essential (primary) hypertension: Secondary | ICD-10-CM | POA: Diagnosis not present

## 2019-04-01 DIAGNOSIS — I672 Cerebral atherosclerosis: Secondary | ICD-10-CM | POA: Diagnosis not present

## 2019-04-05 ENCOUNTER — Other Ambulatory Visit: Payer: Self-pay

## 2019-04-05 ENCOUNTER — Emergency Department (HOSPITAL_COMMUNITY)
Admission: EM | Admit: 2019-04-05 | Discharge: 2019-04-06 | Disposition: A | Discharge status: Home / Self Care | Payer: Medicare Other | Attending: Emergency Medicine | Admitting: Emergency Medicine

## 2019-04-05 ENCOUNTER — Encounter (HOSPITAL_COMMUNITY): Payer: Self-pay | Admitting: Emergency Medicine

## 2019-04-05 ENCOUNTER — Emergency Department (HOSPITAL_COMMUNITY): Payer: Medicare Other

## 2019-04-05 DIAGNOSIS — M10471 Other secondary gout, right ankle and foot: Secondary | ICD-10-CM | POA: Insufficient documentation

## 2019-04-05 DIAGNOSIS — M25571 Pain in right ankle and joints of right foot: Secondary | ICD-10-CM | POA: Insufficient documentation

## 2019-04-05 DIAGNOSIS — M79642 Pain in left hand: Secondary | ICD-10-CM | POA: Diagnosis not present

## 2019-04-05 DIAGNOSIS — M10442 Other secondary gout, left hand: Secondary | ICD-10-CM | POA: Insufficient documentation

## 2019-04-05 DIAGNOSIS — M7989 Other specified soft tissue disorders: Secondary | ICD-10-CM | POA: Diagnosis not present

## 2019-04-05 NOTE — ED Triage Notes (Signed)
Pt c/o right ankle pain. Denies injury/trauma. Hx gout, states he normally gets gout in his hands. Swelling noted to ankle.

## 2019-04-06 DIAGNOSIS — M25571 Pain in right ankle and joints of right foot: Secondary | ICD-10-CM | POA: Diagnosis not present

## 2019-04-06 MED ORDER — HYDROCODONE-ACETAMINOPHEN 5-325 MG PO TABS: 1.0000 | ORAL_TABLET | Freq: Three times a day (TID) | ORAL | 0 refills | Status: AC | PRN

## 2019-04-06 MED ORDER — METHYLPREDNISOLONE 4 MG PO TBPK: ORAL_TABLET | ORAL | 0 refills | Status: DC

## 2019-04-06 MED ORDER — COLCHICINE 0.6 MG PO TABS
0.6000 mg | ORAL_TABLET | Freq: Once | ORAL | Status: AC
  Administered 2019-04-06: 0.6 mg via ORAL
  Filled 2019-04-06: qty 1

## 2019-04-06 MED ORDER — HYDROCODONE-ACETAMINOPHEN 5-325 MG PO TABS
1.0000 | ORAL_TABLET | Freq: Once | ORAL | Status: AC
  Administered 2019-04-06: 1 via ORAL
  Filled 2019-04-06: qty 1

## 2019-04-06 NOTE — ED Provider Notes (Addendum)
[facility] EMERGENCY DEPARTMENT Provider Note  CSN: [id number] Arrival date & time: 04/05/19 2200  Chief Complaint(s) Ankle Pain  HPI Randall Mccall is a 48 y.o. male h/o gout here with left hand and right foot pain that began last night and gradually worsened throughout the day. This is consistent with his prior gout flares. Pain is achy/throbbing, severe. Worse with movement and palpation. No alleviating factors. Reports that he ran out of his Colchicine yesterday. Thinks the flare is related to eating hot sauce. No fevers or chills no trauma. No other physical complaints.  HPI  Past Medical History Past Medical History:  Diagnosis Date  . Gout   . Hypertension   . Muscle weakness   . Stroke [facility])    Patient Active Problem List   Diagnosis Date Noted  . CKD (chronic kidney disease) 12/18/2016  . Benign essential HTN   . Idiopathic gout   . Diastolic dysfunction   . Cocaine abuse ([place])   . Spastic hemiparesis of right dominant side ([place])   . 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 Cerebrovascular accident (CVA) ([place])   . Acute ischemic stroke ([place]) 09/15/2016  . Stroke (cerebrum) ([place]) 09/15/2016  . Hypertension 09/15/2016  . Vitamin B12 deficiency 08/10/2015  . Weakness 07/02/2013  . Abnormal CPK 07/02/2013   Home Medication(s) Prior to Admission medications   Medication Sig Start Date End Date Taking? Authorizing Provider  allopurinol (ZYLOPRIM) 100 MG tablet Take 1 tablet (100 mg total) by mouth daily. 10/16/17   Daleen Bo, MD  amLODipine (NORVASC) 10 MG tablet Take 1 tablet (10 mg total) by mouth daily. 06/15/16   Elnora Morrison, MD  aspirin 325 MG tablet Take 1 tablet (325 mg total) by mouth daily. 09/18/16   Nita Sells, MD  atorvastatin (LIPITOR) 80 MG tablet Take 1 tablet (80 mg total) by mouth daily at 6 PM. 09/17/16   Nita Sells, MD  Cholecalciferol (VITAMIN D3)  5000 units TABS Take 1 tablet by mouth daily. 09/25/17   [provider]  cloNIDine (CATAPRES) 0.1 MG tablet Take 1 tablet (0.1 mg total) by mouth 2 (two) times daily. 06/15/16   Elnora Morrison, MD  colchicine 0.6 MG tablet Take 1 tablet (0.6 mg total) by mouth every hour as needed. Up to 4 in one 24-hour period, if needed for pain 10/16/17   Daleen Bo, MD  gabapentin (NEURONTIN) 300 MG capsule Take 300 mg by mouth at bedtime. 10/09/17   [provider]  HYDROcodone-acetaminophen (NORCO/VICODIN) 5-325 MG tablet Take 1 tablet by mouth every 8 (eight) hours as needed for up to 5 days for severe pain (That is not improved by your scheduled acetaminophen regimen). Please do not exceed 4000 mg of acetaminophen (Tylenol) a 24-hour period. Please note that he may be prescribed additional medicine that contains acetaminophen. 04/06/19 04/11/19  Fatima Blank, MD  methylPREDNISolone (MEDROL DOSEPAK) 4 MG TBPK tablet Use as directed on the package 04/06/19   Cardama, Grayce Sessions, MD  oxyCODONE-acetaminophen (PERCOCET/ROXICET) 5-325 MG tablet Take 1 tablet by mouth every 8 (eight) hours as needed for severe pain. 05/20/18   Khatri, Hina, PA-C  predniSONE (STERAPRED UNI-PAK 21 TAB) 10 MG (21) TBPK tablet Take by mouth daily. Take 6 tabs by mouth daily  for 1 days, then 5 tabs for 2 days, then 4 tabs for 2 days, then 3 tabs for 2 days, 2 tabs for 2 days, then 1 tab by  mouth daily for 2 days 05/20/18   Delia Heady, PA-C  senna-docusate (SENOKOT-S) 8.6-50 MG tablet Take 1 tablet by mouth at bedtime as needed for mild constipation. 09/17/16   Nita Sells, MD                                                                                                                                    Past Surgical History Past Surgical History:  Procedure Laterality Date  . KNEE SURGERY     Family History Family History  Problem Relation Age of Onset  . Hypertension Mother   . Cirrhosis  Father     Social History Social History   Tobacco Use  . Smoking status: Never Smoker  . Smokeless tobacco: Never Used  Substance Use Topics  . Alcohol use: Yes    Alcohol/week: 1.0 standard drinks    Types: 1 Cans of beer per week    Comment: occ  . Drug use: No   Allergies Patient has no known allergies.  Review of Systems Review of Systems All other systems are reviewed and are negative for acute change except as noted in the HPI  Physical Exam Vital Signs  I have reviewed the triage vital signs BP (!) 134/94 (BP Location: Left Arm)   Pulse 98   Temp 98.1 F (36.7 C) (Oral)   Resp 20   SpO2 100%   Physical Exam Vitals signs reviewed.  Constitutional:      General: He is not in acute distress.    Appearance: He is well-developed. He is not diaphoretic.  HENT:     Head: Normocephalic and atraumatic.     Jaw: No trismus.     Right Ear: External ear normal.     Left Ear: External ear normal.     Nose: Nose normal.  Eyes:     General: No scleral icterus.    Conjunctiva/sclera: Conjunctivae normal.  Neck:     Musculoskeletal: Normal range of motion.     Trachea: Phonation normal.  Cardiovascular:     Rate and Rhythm: Normal rate and regular rhythm.  Pulmonary:     Effort: Pulmonary effort is normal. No respiratory distress.     Breath sounds: No stridor.  Abdominal:     General: There is no distension.  Musculoskeletal: Normal range of motion.     Left wrist: He exhibits tenderness and swelling. He exhibits normal range of motion.     Right foot: Normal range of motion. Tenderness and swelling present.  Neurological:     Mental Status: He is alert and oriented to person, place, and time.  Psychiatric:        Behavior: Behavior normal.     ED Results and Treatments Labs (all labs ordered are listed, but only abnormal results are displayed) Labs Reviewed - No data to display  EKG  EKG Interpretation  Date/Time:    Ventricular Rate:    PR Interval:    QRS Duration:   QT Interval:    QTC Calculation:   R Axis:     Text Interpretation:        Radiology Dg Ankle Complete Right  Result Date: 04/05/2019 CLINICAL DATA:  Swelling. EXAM: RIGHT ANKLE - COMPLETE 3+ VIEW COMPARISON:  None. FINDINGS: There is nonspecific soft tissue swelling about the ankle. There is no acute displaced fracture or dislocation. The osseous mineralization is within normal limits. There are mild-to-moderate degenerative changes of the midfoot. There is a trace plantar calcaneal spur. IMPRESSION: No acute displaced fracture or dislocation. Electronically Signed   By: Constance Holster M.D.   On: 04/05/2019 22:50    Pertinent labs & imaging results that were available during my care of the patient were reviewed by me and considered in my medical decision making (see chart for details).  Medications Ordered in ED Medications  HYDROcodone-acetaminophen (NORCO/VICODIN) 5-325 MG per tablet 1 tablet (has no administration in time range)  colchicine tablet 0.6 mg (has no administration in time range)                                                                                                                                    Procedures Procedures  (including critical care time)  Medical Decision Making / ED Course I have reviewed the nursing notes for this encounter and the patient's prior records (if available in EHR or on provided paperwork).   Randall Mccall was evaluated in Emergency Department on 04/06/2019 for the symptoms described in the history of present illness. He was evaluated in the context of the global COVID-19 pandemic, which necessitated consideration that the patient might be at risk for infection with the SARS-CoV-2 virus that causes COVID-19. Institutional protocols and algorithms that pertain to the evaluation of patients at  risk for COVID-19 are in a state of rapid change based on information released by regulatory bodies including the CDC and federal and state organizations. These policies and algorithms were followed during the patient's care in the ED.  Presentation consistent with gout flare. Able to ROM, afebrile. Doubt septic arthritis.   Supportive management. Already has Rx to refill colchicine.   The patient appears reasonably screened and/or stabilized for discharge and I doubt any other medical condition or other [facility] requiring further screening, evaluation, or treatment in the ED at this time prior to discharge.  The patient is safe for discharge with strict return precautions.       Final Clinical Impression(s) / ED Diagnoses Final diagnoses:  Other secondary acute gout of left hand  Other secondary acute gout of right foot     The patient appears reasonably screened and/or stabilized for discharge and I doubt any other medical condition or other [facility] requiring further screening, evaluation, or treatment in the ED at this time  prior to discharge.  Disposition: Discharge  Condition: Good  I have discussed the results, Dx and Tx plan with the patient who expressed understanding and agree(s) with the plan. Discharge instructions discussed at great length. The patient was given strict return precautions who verbalized understanding of the instructions. No further questions at time of discharge.    ED Discharge Orders         Ordered    HYDROcodone-acetaminophen (NORCO/VICODIN) 5-325 MG tablet  Every 8 hours PRN     04/06/19 0254    methylPREDNISolone (MEDROL DOSEPAK) 4 MG TBPK tablet     04/06/19 0254          [place] narcotic database reviewed and no active prescriptions noted.   Follow Up: Ben[address] M[address], [address] [place] [address] [phone]  Schedule an appointment as soon as possible for a visit  As needed     This chart was dictated using  voice recognition software.  Despite best efforts to proofread,  errors can occur which can change the documentation meaning.     Fatima Blank, MD 04/06/19 [phone]

## 2019-05-01 ENCOUNTER — Encounter: Payer: Self-pay | Admitting: Physical Medicine & Rehabilitation

## 2019-05-01 ENCOUNTER — Other Ambulatory Visit: Payer: Self-pay

## 2019-05-01 ENCOUNTER — Encounter: Payer: Medicare Other | Attending: Physical Medicine & Rehabilitation | Admitting: Physical Medicine & Rehabilitation

## 2019-05-01 VITALS — BP 125/83 | HR 81 | Temp 97.9°F | Ht 73.0 in | Wt 255.0 lb

## 2019-05-01 DIAGNOSIS — G8111 Spastic hemiplegia affecting right dominant side: Secondary | ICD-10-CM | POA: Diagnosis not present

## 2019-05-01 NOTE — Progress Notes (Signed)
Botox Injection for spasticity using needle EMG guidance  Dilution: 50 Units/ml Indication: Severe spasticity which interferes with ADL,mobility and/or  hygiene and is unresponsive to medication management and other conservative care Informed consent was obtained after describing risks and benefits of the procedure with the patient. This includes bleeding, bruising, infection, excessive weakness, or medication side effects. A REMS form is on file and signed. Needle: 27g 1" needle electrode Number of units per muscle  FCR50 FCU0 FDS50 FDP50 FPL0 PQ 25 PT 25 All injections were done after obtaining appropriate EMG activity and after negative drawback for blood. The patient tolerated the procedure well. Post procedure instructions were given. A followup appointment was made.  

## 2019-05-01 NOTE — Patient Instructions (Signed)

## 2019-06-10 DIAGNOSIS — I10 Essential (primary) hypertension: Secondary | ICD-10-CM | POA: Diagnosis not present

## 2019-06-10 DIAGNOSIS — R600 Localized edema: Secondary | ICD-10-CM | POA: Diagnosis not present

## 2019-06-10 DIAGNOSIS — G629 Polyneuropathy, unspecified: Secondary | ICD-10-CM | POA: Diagnosis not present

## 2019-06-10 DIAGNOSIS — E7800 Pure hypercholesterolemia, unspecified: Secondary | ICD-10-CM | POA: Diagnosis not present

## 2019-06-15 ENCOUNTER — Encounter: Payer: Medicare Other | Admitting: Physical Medicine & Rehabilitation

## 2019-06-23 ENCOUNTER — Encounter: Payer: Medicare Other | Attending: Physical Medicine & Rehabilitation | Admitting: Physical Medicine & Rehabilitation

## 2019-06-23 ENCOUNTER — Other Ambulatory Visit: Payer: Self-pay

## 2019-06-23 ENCOUNTER — Encounter: Payer: Self-pay | Admitting: Physical Medicine & Rehabilitation

## 2019-06-23 VITALS — BP 134/93 | HR 79 | Temp 97.9°F | Ht 73.0 in | Wt 257.0 lb

## 2019-06-23 DIAGNOSIS — G8111 Spastic hemiplegia affecting right dominant side: Secondary | ICD-10-CM

## 2019-06-23 NOTE — Progress Notes (Signed)
Subjective:    Patient ID: Randall Mccall, male    DOB: 04-24-1971, 48 y.o.   MRN: [id number]  HPI 47 year old male with chronic right spastic hemiplegia secondary to CVA who returns today after having received a botulinum toxin injection approximately 6 weeks ago.  He had no post injection issues.  He feels like his hand is loosened up.  He demonstrates the type of stretching exercises that he does which are primarily for his right shoulder. Botox FCR50 FCU0 FDS50 FDP50 FPL0 PQ 25 PT 25 Pain Inventory Average Pain 0 Pain Right Now 0 My pain is na  In the last 24 hours, has pain interfered with the following? General activity 0 Relation with others 0 Enjoyment of life 0 What TIME of day is your pain at its worst? na Sleep (in general) Fair  Pain is worse with: na Pain improves with: na Relief from Meds: na  Mobility walk without assistance ability to climb steps?  yes do you drive?  no  Function disabled: date disabled .  Neuro/Psych No problems in this area  Prior Studies Any changes since last visit?  no  Physicians involved in your care Any changes since last visit?  no   Family History  Problem Relation Age of Onset  . Hypertension Mother   . Cirrhosis Father    Social History   Socioeconomic History  . Marital status: Single    Spouse name: Not on file  . Number of children: 0  . Years of education: 16  . Highest education level: Not on file  Occupational History    Comment: Disabled  Social Needs  . Financial resource strain: Not on file  . Food insecurity    Worry: Not on file    Inability: Not on file  . Transportation needs    Medical: Not on file    Non-medical: Not on file  Tobacco Use  . Smoking status: Never Smoker  . Smokeless tobacco: Never Used  Substance and Sexual Activity  . Alcohol use: Yes    Alcohol/week: 1.0 standard drinks    Types: 1 Cans of beer per week    Comment: occ  . Drug use: No  . Sexual activity: Not  on file  Lifestyle  . Physical activity    Days per week: Not on file    Minutes per session: Not on file  . Stress: Not on file  Relationships  . Social Herbalist on phone: Not on file    Gets together: Not on file    Attends religious service: Not on file    Active member of club or organization: Not on file    Attends meetings of clubs or organizations: Not on file    Relationship status: Not on file  Other Topics Concern  . Not on file  Social History Narrative   Patient lives alone and he is single.   Right handed.   Caffeine-    Education- 12 th grade   Disabled.   Past Surgical History:  Procedure Laterality Date  . KNEE SURGERY     Past Medical History:  Diagnosis Date  . Gout   . Hypertension   . Muscle weakness   . Stroke ([facility])    Temp 97.9 F (36.6 C)   Ht 6\' 1"  (1.854 m)   Wt 257 lb (116.6 kg)   BMI 33.91 kg/m   Opioid Risk Score:   Fall Risk Score:  `1  Depression screen  PHQ 2/9  Depression screen [facility] 2/9 03/09/2019 12/08/2018 10/24/2018 09/26/2018 03/20/2018 03/03/2018  Decreased Interest 0 0 0 0 0 0  Down, Depressed, Hopeless 0 - 0 0 0 1  PHQ - 2 Score 0 0 0 0 0 1    Review of Systems  Constitutional: Negative.   HENT: Negative.   Eyes: Negative.   Respiratory: Negative.   Cardiovascular: Negative.   Gastrointestinal: Negative.   Endocrine: Negative.   Genitourinary: Negative.   Musculoskeletal: Negative.        Spasticity  Skin: Negative.   Allergic/Immunologic: Negative.   Neurological: Negative.   Hematological: Negative.   Psychiatric/Behavioral: Negative.   All other systems reviewed and are negative.      Objective:   Physical Exam Vitals signs and nursing note reviewed.  Constitutional:      Appearance: Normal appearance.  HENT:     Head: Normocephalic and atraumatic.  Eyes:     Extraocular Movements: Extraocular movements intact.     Conjunctiva/sclera: Conjunctivae normal.     Pupils: Pupils are equal,  round, and reactive to light.  Neurological:     Mental Status: He is alert.     MAS 3 right wrist flexor MAS 2 at the finger flexors MAS 1 at the thumb flexor MAS 2 at the pronators  Motor strength 3 - at the deltoid bicep tricep finger flexors 0 at the finger extensors and the wrist extensors in the right upper extremity Left upper extremity has normal tone and normal strength    Assessment & Plan:  #1.  Right spastic hemiplegia secondary to left CVA.  He has done relatively well with the last injection but still has significant tone in the wrist flexors therefore will add the FCU.  Continue current dosing with the finger flexors.  Increase pronator dosing  Botox FCR50 FCU50 FDS50 FDP50 FPL0 PQ 50 PT 50

## 2019-07-16 DIAGNOSIS — G629 Polyneuropathy, unspecified: Secondary | ICD-10-CM | POA: Diagnosis not present

## 2019-07-16 DIAGNOSIS — R600 Localized edema: Secondary | ICD-10-CM | POA: Diagnosis not present

## 2019-07-16 DIAGNOSIS — E7800 Pure hypercholesterolemia, unspecified: Secondary | ICD-10-CM | POA: Diagnosis not present

## 2019-07-16 DIAGNOSIS — I10 Essential (primary) hypertension: Secondary | ICD-10-CM | POA: Diagnosis not present

## 2019-07-16 DIAGNOSIS — Z23 Encounter for immunization: Secondary | ICD-10-CM | POA: Diagnosis not present

## 2019-07-20 DIAGNOSIS — I672 Cerebral atherosclerosis: Secondary | ICD-10-CM | POA: Diagnosis not present

## 2019-07-20 DIAGNOSIS — I10 Essential (primary) hypertension: Secondary | ICD-10-CM | POA: Diagnosis not present

## 2019-07-20 DIAGNOSIS — I69351 Hemiplegia and hemiparesis following cerebral infarction affecting right dominant side: Secondary | ICD-10-CM | POA: Diagnosis not present

## 2019-07-20 DIAGNOSIS — E785 Hyperlipidemia, unspecified: Secondary | ICD-10-CM | POA: Diagnosis not present

## 2019-07-23 ENCOUNTER — Encounter (HOSPITAL_COMMUNITY): Payer: Self-pay | Admitting: Emergency Medicine

## 2019-07-23 ENCOUNTER — Emergency Department (HOSPITAL_COMMUNITY)
Admission: EM | Admit: 2019-07-23 | Discharge: 2019-07-23 | Disposition: A | Discharge status: Home / Self Care | Payer: Medicare Other | Attending: Emergency Medicine | Admitting: Emergency Medicine

## 2019-07-23 DIAGNOSIS — M109 Gout, unspecified: Secondary | ICD-10-CM | POA: Diagnosis not present

## 2019-07-23 DIAGNOSIS — I130 Hypertensive heart and chronic kidney disease with heart failure and stage 1 through stage 4 chronic kidney disease, or unspecified chronic kidney disease: Secondary | ICD-10-CM | POA: Diagnosis not present

## 2019-07-23 DIAGNOSIS — M79641 Pain in right hand: Secondary | ICD-10-CM | POA: Insufficient documentation

## 2019-07-23 DIAGNOSIS — Z7982 Long term (current) use of aspirin: Secondary | ICD-10-CM | POA: Insufficient documentation

## 2019-07-23 DIAGNOSIS — I5032 Chronic diastolic (congestive) heart failure: Secondary | ICD-10-CM | POA: Diagnosis not present

## 2019-07-23 DIAGNOSIS — N189 Chronic kidney disease, unspecified: Secondary | ICD-10-CM | POA: Diagnosis not present

## 2019-07-23 DIAGNOSIS — Z79899 Other long term (current) drug therapy: Secondary | ICD-10-CM | POA: Insufficient documentation

## 2019-07-23 DIAGNOSIS — M79671 Pain in right foot: Secondary | ICD-10-CM | POA: Diagnosis present

## 2019-07-23 MED ORDER — METHYLPREDNISOLONE 4 MG PO TBPK: ORAL_TABLET | ORAL | 0 refills | Status: DC

## 2019-07-23 MED ORDER — OXYCODONE-ACETAMINOPHEN 5-325 MG PO TABS: 1.0000 | ORAL_TABLET | Freq: Three times a day (TID) | ORAL | 0 refills | Status: DC | PRN

## 2019-07-23 NOTE — ED Notes (Signed)
Patient complains of swelling to right wrist and knees since am. States that the garlic he ate last night has flared up his gout. Reports taking colchicine daily

## 2019-07-23 NOTE — ED Notes (Signed)
Patient verbalizes understanding of discharge instructions. Opportunity for questioning and answers were provided. Armband removed by staff, pt discharged from ED. Wheeled out to lobby  

## 2019-07-23 NOTE — ED Provider Notes (Signed)
[place] EMERGENCY DEPARTMENT Provider Note   CSN: [id number] Arrival date & time: 07/23/19  0907     History   Chief Complaint Chief Complaint  Patient presents with  . Gout    HPI Randall Mccall is a 48 y.o. male.     The history is provided by the patient. No language interpreter was used.  Foot Pain This is a recurrent problem. The current episode started more than 2 days ago. The problem occurs constantly. Exacerbated by: pepperoni pizza. Nothing relieves the symptoms. He has tried nothing for the symptoms.  Pt reports his gout has flared up after eating peperoni pizza  Past Medical History:  Diagnosis Date  . Gout   . Hypertension   . Muscle weakness   . Stroke [place])     Patient Active Problem List   Diagnosis Date Noted  . CKD (chronic kidney disease) 12/18/2016  . Benign essential HTN   . Idiopathic gout   . Diastolic dysfunction   . Cocaine abuse ([place])   . Spastic hemiparesis of right dominant side ([place])   . 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 Cerebrovascular accident (CVA) ([place])   . Acute ischemic stroke ([place]) 09/15/2016  . Stroke (cerebrum) ([place]) 09/15/2016  . Hypertension 09/15/2016  . Vitamin B12 deficiency 08/10/2015  . Weakness 07/02/2013  . Abnormal CPK 07/02/2013    Past Surgical History:  Procedure Laterality Date  . KNEE SURGERY          Home Medications    Prior to Admission medications   Medication Sig Start Date End Date Taking? Authorizing Provider  allopurinol (ZYLOPRIM) 100 MG tablet Take 1 tablet (100 mg total) by mouth daily. 10/16/17   Daleen Bo, MD  amLODipine (NORVASC) 10 MG tablet Take 1 tablet (10 mg total) by mouth daily. 06/15/16   Elnora Morrison, MD  aspirin 325 MG tablet Take 1 tablet (325 mg total) by mouth daily. 09/18/16   Nita Sells, MD  atorvastatin (LIPITOR) 80 MG tablet Take 1 tablet (80 mg total) by mouth daily  at 6 PM. 09/17/16   Nita Sells, MD  Cholecalciferol (VITAMIN D3) 5000 units TABS Take 1 tablet by mouth daily. 09/25/17   [provider]  cloNIDine (CATAPRES) 0.1 MG tablet Take 1 tablet (0.1 mg total) by mouth 2 (two) times daily. 06/15/16   Elnora Morrison, MD  colchicine 0.6 MG tablet Take 1 tablet (0.6 mg total) by mouth every hour as needed. Up to 4 in one 24-hour period, if needed for pain 10/16/17   Daleen Bo, MD  gabapentin (NEURONTIN) 300 MG capsule Take 300 mg by mouth at bedtime. 10/09/17   [provider]  methylPREDNISolone (MEDROL DOSEPAK) 4 MG TBPK tablet Use as directed on the package 07/23/19   Carmin Muskrat, MD  oxyCODONE-acetaminophen (PERCOCET/ROXICET) 5-325 MG tablet Take 1 tablet by mouth every 8 (eight) hours as needed for severe pain. 07/23/19   Carmin Muskrat, MD  predniSONE (STERAPRED UNI-PAK 21 TAB) 10 MG (21) TBPK tablet Take by mouth daily. Take 6 tabs by mouth daily  for 1 days, then 5 tabs for 2 days, then 4 tabs for 2 days, then 3 tabs for 2 days, 2 tabs for 2 days, then 1 tab by mouth daily for 2 days 05/20/18   Shelly Coss, Hina, PA-C  senna-docusate (SENOKOT-S) 8.6-50 MG tablet Take 1 tablet by mouth at bedtime as needed for mild constipation. 09/17/16   Samtani,  Darylene Price, MD    Family History Family History  Problem Relation Age of Onset  . Hypertension Mother   . Cirrhosis Father     Social History Social History   Tobacco Use  . Smoking status: Never Smoker  . Smokeless tobacco: Never Used  Substance Use Topics  . Alcohol use: Yes    Alcohol/week: 1.0 standard drinks    Types: 1 Cans of beer per week    Comment: occ  . Drug use: No     Allergies   Patient has no known allergies.   Review of Systems Review of Systems  All other systems reviewed and are negative.    Physical Exam Updated Vital Signs BP 128/90 (BP Location: Left Arm)   Pulse (!) 105   Temp 98.5 F (36.9 C)   Resp 17   Ht 6\' 1"  (1.854 m)    Wt 113.4 kg   SpO2 99%   BMI 32.98 kg/m   Physical Exam Vitals signs and nursing note reviewed.  Constitutional:      Appearance: He is well-developed.  HENT:     Head: Normocephalic and atraumatic.  Eyes:     Conjunctiva/sclera: Conjunctivae normal.  Neck:     Musculoskeletal: Neck supple.  Cardiovascular:     Rate and Rhythm: Normal rate and regular rhythm.     Heart sounds: No murmur.  Pulmonary:     Effort: Pulmonary effort is normal. No respiratory distress.     Breath sounds: Normal breath sounds.  Abdominal:     Palpations: Abdomen is soft.     Tenderness: There is no abdominal tenderness.  Musculoskeletal: Normal range of motion.     Comments: Tender right foot and toes   Skin:    General: Skin is warm and dry.  Neurological:     General: No focal deficit present.     Mental Status: He is alert.  Psychiatric:        Mood and Affect: Mood normal.      ED Treatments / Results  Labs (all labs ordered are listed, but only abnormal results are displayed) Labs Reviewed - No data to display  EKG None  Radiology No results found.  Procedures Procedures (including critical care time)  Medications Ordered in ED Medications - No data to display   Initial Impression / Assessment and Plan / ED Course  I have reviewed the triage vital signs and the nursing notes.  Pertinent labs & imaging results that were available during my care of the patient were reviewed by me and considered in my medical decision making (see chart for details).        MDM  Pt counseled on avoiding smoked meat.    Final Clinical Impressions(s) / ED Diagnoses   Final diagnoses:  Acute gout of right wrist, unspecified cause    ED Discharge Orders         Ordered    methylPREDNISolone (MEDROL DOSEPAK) 4 MG TBPK tablet  Status:  Discontinued     07/23/19 1040    oxyCODONE-acetaminophen (PERCOCET/ROXICET) 5-325 MG tablet  Every 8 hours PRN,   Status:  Discontinued     07/23/19  1040    methylPREDNISolone (MEDROL DOSEPAK) 4 MG TBPK tablet     07/23/19 1137    oxyCODONE-acetaminophen (PERCOCET/ROXICET) 5-325 MG tablet  Every 8 hours PRN     07/23/19 1137        An After Visit Summary was printed and given to the patient.  Fransico Meadow, [place] 07/23/19 1411    Carmin Muskrat, MD 07/24/19 [phone]

## 2019-07-23 NOTE — ED Triage Notes (Signed)
Pt reports having gout flare in right ankle and left hand.

## 2019-08-04 ENCOUNTER — Other Ambulatory Visit: Payer: Self-pay

## 2019-08-04 ENCOUNTER — Encounter: Payer: Self-pay | Admitting: Physical Medicine & Rehabilitation

## 2019-08-04 ENCOUNTER — Encounter: Payer: Medicare Other | Attending: Physical Medicine & Rehabilitation | Admitting: Physical Medicine & Rehabilitation

## 2019-08-04 VITALS — BP 119/81 | HR 88 | Temp 97.5°F | Ht 73.0 in | Wt 250.0 lb

## 2019-08-04 DIAGNOSIS — G8111 Spastic hemiplegia affecting right dominant side: Secondary | ICD-10-CM

## 2019-08-04 NOTE — Progress Notes (Signed)
Botox Injection for spasticity using needle EMG guidance  Dilution: 50 Units/ml Indication: Severe spasticity which interferes with ADL,mobility and/or  hygiene and is unresponsive to medication management and other conservative care Informed consent was obtained after describing risks and benefits of the procedure with the patient. This includes bleeding, bruising, infection, excessive weakness, or medication side effects. A REMS form is on file and signed. Needle: 27g 1" needle electrode-  Number of units per muscle Botox FCR50 FCU50 FDS50 FDP50  PQ 50 PT 50 All injections were done after obtaining appropriate EMG activity and after negative drawback for blood. The patient tolerated the procedure well. Post procedure instructions were given. A followup appointment was made.    May consider 25g 2"

## 2019-08-10 ENCOUNTER — Emergency Department (HOSPITAL_COMMUNITY)
Admission: EM | Admit: 2019-08-10 | Discharge: 2019-08-10 | Disposition: A | Discharge status: Home / Self Care | Payer: Medicare Other | Attending: Emergency Medicine | Admitting: Emergency Medicine

## 2019-08-10 ENCOUNTER — Other Ambulatory Visit: Payer: Self-pay

## 2019-08-10 ENCOUNTER — Encounter (HOSPITAL_COMMUNITY): Payer: Self-pay

## 2019-08-10 ENCOUNTER — Emergency Department (HOSPITAL_COMMUNITY): Payer: Medicare Other

## 2019-08-10 DIAGNOSIS — N189 Chronic kidney disease, unspecified: Secondary | ICD-10-CM | POA: Diagnosis not present

## 2019-08-10 DIAGNOSIS — Z7982 Long term (current) use of aspirin: Secondary | ICD-10-CM | POA: Insufficient documentation

## 2019-08-10 DIAGNOSIS — I129 Hypertensive chronic kidney disease with stage 1 through stage 4 chronic kidney disease, or unspecified chronic kidney disease: Secondary | ICD-10-CM | POA: Diagnosis not present

## 2019-08-10 DIAGNOSIS — M25522 Pain in left elbow: Secondary | ICD-10-CM | POA: Insufficient documentation

## 2019-08-10 DIAGNOSIS — Z8673 Personal history of transient ischemic attack (TIA), and cerebral infarction without residual deficits: Secondary | ICD-10-CM | POA: Insufficient documentation

## 2019-08-10 DIAGNOSIS — W19XXXA Unspecified fall, initial encounter: Secondary | ICD-10-CM | POA: Diagnosis not present

## 2019-08-10 DIAGNOSIS — M25422 Effusion, left elbow: Secondary | ICD-10-CM | POA: Diagnosis not present

## 2019-08-10 DIAGNOSIS — Z79899 Other long term (current) drug therapy: Secondary | ICD-10-CM | POA: Diagnosis not present

## 2019-08-10 MED ORDER — NAPROXEN 500 MG PO TABS: 500.0000 mg | ORAL_TABLET | Freq: Two times a day (BID) | ORAL | 0 refills | Status: AC

## 2019-08-10 NOTE — Discharge Instructions (Addendum)
Your x-ray today was normal.  I have prescribed a short course of anti-inflammatories to help with the swelling, you may take 1 tablet twice a day for the neck 7 days.  We have also placed your left elbow on an Ace wrap, please keep this for the next 3 days, you may discontinue this after, continue to move the left elbow.  You may also apply ice or heat to the area to help with the swelling.

## 2019-08-10 NOTE — ED Provider Notes (Signed)
[place] EMERGENCY DEPARTMENT Provider Note   CSN: [id number] Arrival date & time: 08/10/19  1141     History No chief complaint on file.   Randall Mccall is a 48 y.o. male.  48 y/o male with a PMH of HTN, Gout, Stroke presents to the ED with a chief complaint of left elbow pain x 3 days.  Patient describes a mechanical fall, where he landed on his left elbow, since he is noted sharp pain to the area the pain is worse with elbow extension.  No alleviating factors.  He is concerned as he does get gout to the left elbow however this feels somewhat different.  He has not tried any medical therapy for improvement in symptoms.  No other trauma, IVDU, fever or changes in skin.  The history is provided by the patient and medical records.       Past Medical History:  Diagnosis Date  . Gout   . Hypertension   . Muscle weakness   . Stroke [facility])     Patient Active Problem List   Diagnosis Date Noted  . CKD (chronic kidney disease) 12/18/2016  . Benign essential HTN   . Idiopathic gout   . Diastolic dysfunction   . Cocaine abuse ([place])   . Spastic hemiparesis of right dominant side ([place])   . 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 Cerebrovascular accident (CVA) ([place])   . Acute ischemic stroke ([place]) 09/15/2016  . Stroke (cerebrum) ([place]) 09/15/2016  . Hypertension 09/15/2016  . Vitamin B12 deficiency 08/10/2015  . Weakness 07/02/2013  . Abnormal CPK 07/02/2013    Past Surgical History:  Procedure Laterality Date  . KNEE SURGERY         Family History  Problem Relation Age of Onset  . Hypertension Mother   . Cirrhosis Father     Social History   Tobacco Use  . Smoking status: Never Smoker  . Smokeless tobacco: Never Used  Substance Use Topics  . Alcohol use: Yes    Alcohol/week: 1.0 standard drinks    Types: 1 Cans of beer per week    Comment: occ  . Drug use: No    Home  Medications Prior to Admission medications   Medication Sig Start Date End Date Taking? Authorizing Provider  allopurinol (ZYLOPRIM) 100 MG tablet Take 1 tablet (100 mg total) by mouth daily. 10/16/17   Daleen Bo, MD  amLODipine (NORVASC) 10 MG tablet Take 1 tablet (10 mg total) by mouth daily. 06/15/16   Elnora Morrison, MD  aspirin 325 MG tablet Take 1 tablet (325 mg total) by mouth daily. 09/18/16   Nita Sells, MD  atorvastatin (LIPITOR) 80 MG tablet Take 1 tablet (80 mg total) by mouth daily at 6 PM. 09/17/16   Nita Sells, MD  Cholecalciferol (VITAMIN D3) 5000 units TABS Take 1 tablet by mouth daily. 09/25/17   [provider]  cloNIDine (CATAPRES) 0.1 MG tablet Take 1 tablet (0.1 mg total) by mouth 2 (two) times daily. 06/15/16   Elnora Morrison, MD  colchicine 0.6 MG tablet Take 1 tablet (0.6 mg total) by mouth every hour as needed. Up to 4 in one 24-hour period, if needed for pain 10/16/17   Daleen Bo, MD  gabapentin (NEURONTIN) 300 MG capsule Take 300 mg by mouth at bedtime. 10/09/17   [provider]  methylPREDNISolone (MEDROL DOSEPAK) 4 MG TBPK tablet Use as directed on the package 07/23/19  Carmin Muskrat, MD  naproxen (NAPROSYN) 500 MG tablet Take 1 tablet (500 mg total) by mouth 2 (two) times daily for 7 days. 08/10/19 08/17/19  Janeece Fitting, PA-C  oxyCODONE-acetaminophen (PERCOCET/ROXICET) 5-325 MG tablet Take 1 tablet by mouth every 8 (eight) hours as needed for severe pain. 07/23/19   Carmin Muskrat, MD  predniSONE (STERAPRED UNI-PAK 21 TAB) 10 MG (21) TBPK tablet Take by mouth daily. Take 6 tabs by mouth daily  for 1 days, then 5 tabs for 2 days, then 4 tabs for 2 days, then 3 tabs for 2 days, 2 tabs for 2 days, then 1 tab by mouth daily for 2 days 05/20/18   Shelly Coss, [place]ina, PA-C  senna-docusate (SENOKOT-S) 8.6-50 MG tablet Take 1 tablet by mouth at bedtime as needed for mild constipation. 09/17/16   Nita Sells, MD    Allergies      Patient has no known allergies.  Review of Systems   Review of Systems  Constitutional: Negative for fever.  Musculoskeletal: Positive for arthralgias and back pain. Negative for myalgias.  Skin: Negative for color change, rash and wound.    Physical Exam Updated Vital Signs BP (!) 151/68 (BP Location: Right Arm)   Pulse 81   Temp 98.1 F (36.7 C) (Oral)   Resp 16   SpO2 100%   Physical Exam Vitals and nursing note reviewed.  Constitutional:      Appearance: He is well-developed.  HENT:     Head: Normocephalic and atraumatic.  Eyes:     General: No scleral icterus.    Pupils: Pupils are equal, round, and reactive to light.  Cardiovascular:     Heart sounds: Normal heart sounds.  Pulmonary:     Effort: Pulmonary effort is normal.     Breath sounds: Normal breath sounds. No wheezing.  Chest:     Chest wall: No tenderness.  Abdominal:     General: Bowel sounds are normal. There is no distension.     Palpations: Abdomen is soft.     Tenderness: There is no abdominal tenderness.  Musculoskeletal:        General: No deformity.     Left elbow: Swelling and effusion present. No deformity or lacerations. Normal range of motion. Tenderness present.     Cervical back: Normal range of motion.     Comments: Tenderness to palpation to the medial aspect of the left elbow, mild swelling noted along with a palpable effusion.  Full range of motion with pain.  No changes in the skin, 2+ pulses.  Skin:    General: Skin is warm and dry.  Neurological:     Mental Status: He is alert and oriented to person, place, and time.     ED Results / Procedures / Treatments   Labs (all labs ordered are listed, but only abnormal results are displayed) Labs Reviewed - No data to display  EKG None  Radiology DG Elbow 2 Views Left  Result Date: 08/10/2019 CLINICAL DATA:  48 year old male status post fall 3 days ago. Increasing pain. EXAM: LEFT ELBOW - 2 VIEW COMPARISON:  None. FINDINGS:  Bone mineralization is within normal limits. Preserved joint spaces and alignment. On both images the radial head appears intact. No fracture is identified. But there are equivocal signs for joint effusion. There is posterior soft tissue swelling such as in the region of the olecranon. No soft tissue gas. No radiopaque foreign body identified. IMPRESSION: 1. Equivocal elbow joint effusion with no fracture identified. Follow-up radiographs to  exclude occult fracture are recommended if pain does not improved. 2. Posterior soft tissue swelling. Electronically Signed   By: Genevie Ann M.D.   On: 08/10/2019 12:28    Procedures Procedures (including critical care time)  Medications Ordered in ED Medications - No data to display  ED Course  I have reviewed the triage vital signs and the nursing notes.  Pertinent labs & imaging results that were available during my care of the patient were reviewed by me and considered in my medical decision making (see chart for details).    MDM Rules/Calculators/A&P   Patient with a past medical history of gout presents the ED with complaints of left elbow pain after mechanical fall 3 days ago.  He reports the pain is worse with elbow extension, movement of his left elbow.  He has not tried any medication for improvement in symptoms.  During my evaluation pulses present 2+ bilaterally.  There is full range of motion with some pain.  No changes in the skin, no erythema lower suspicion for cellulitis.  Has good mobility of the left elbow, lower suspicion for Gout.   Xray showed: 1. Equivocal elbow joint effusion with no fracture identified.  Follow-up radiographs to exclude occult fracture are recommended if  pain does not improved.  2. Posterior soft tissue swelling.     Last creatinine level was 1, patient moving able to tolerate naproxen at this time to help with the swelling, will also be placed on an Ace wrap to help with the fusion, he is advised to apply ice  and rice therapy to the area.  Patient understands and agrees with management, return precautions discussed at length.   Portions of this note were generated with [employer]. Dictation errors may occur despite best attempts at proofreading.  Final Clinical Impression(s) / ED Diagnoses Final diagnoses:  Left elbow pain  Fall, initial encounter    Rx / DC Orders ED Discharge Orders         Ordered    naproxen (NAPROSYN) 500 MG tablet  2 times daily     08/10/19 1257           Janeece Fitting, PA-C 08/10/19 1315    Drenda Freeze, MD 08/13/19 [phone]

## 2019-08-10 NOTE — ED Triage Notes (Signed)
Patient complains of left elbow pain after mechanical fall on Friday-complains of pain to elbow, also thinks gout flared up. NAD

## 2019-08-10 NOTE — ED Notes (Signed)
Pt discharge instructions and prescriptions reviewed with the patient. The patient verbalized understanding of both. Pt discharged. 

## 2019-09-15 ENCOUNTER — Encounter: Payer: Self-pay | Admitting: Physical Medicine & Rehabilitation

## 2019-09-15 ENCOUNTER — Encounter: Payer: Medicare Other | Attending: Physical Medicine & Rehabilitation | Admitting: Physical Medicine & Rehabilitation

## 2019-09-15 ENCOUNTER — Other Ambulatory Visit: Payer: Self-pay

## 2019-09-15 VITALS — BP 132/87 | HR 92 | Temp 97.5°F | Ht 73.0 in | Wt 247.0 lb

## 2019-09-15 DIAGNOSIS — G8111 Spastic hemiplegia affecting right dominant side: Secondary | ICD-10-CM | POA: Diagnosis not present

## 2019-09-15 NOTE — Progress Notes (Signed)
Subjective:    Patient ID: Randall Mccall, male    DOB: 06-20-1971, 49 y.o.   MRN: [id number]   49 year old male with history of right spastic hemiplegia following CVA.  He was last seen approximately 6 weeks ago for botulinum toxin injections to the right upper extremity to relieve flexor spasticity in the finger and wrist flexors as well as elbow flexor  Patient feels like his tone has improved after the botulinum toxin injection.  He had no excessive soreness in the injection sites.  No excessive hand weakness.  He does still have some stiffness in his fingers mainly in the morning.  He does not have a resting hand splint Botox FCR50 FCU50 FDS50 FDP50  PQ50 PT50 HPI Pain Inventory Average Pain 0 Pain Right Now 0 My pain is na  In the last 24 hours, has pain interfered with the following? General activity 0 Relation with others 0 Enjoyment of life 0 What TIME of day is your pain at its worst? na Sleep (in general) Good  Pain is worse with: na Pain improves with: na Relief from Meds: na  Mobility walk without assistance  Function disabled: date disabled .  Neuro/Psych trouble walking  Prior Studies Any changes since last visit?  no  Physicians involved in your care Any changes since last visit?  no   Family History  Problem Relation Age of Onset  . Hypertension Mother   . Cirrhosis Father    Social History   Socioeconomic History  . Marital status: Single    Spouse name: Not on file  . Number of children: 0  . Years of education: 31  . Highest education level: Not on file  Occupational History    Comment: Disabled  Tobacco Use  . Smoking status: Never Smoker  . Smokeless tobacco: Never Used  Substance and Sexual Activity  . Alcohol use: Yes    Alcohol/week: 1.0 standard drinks    Types: 1 Cans of beer per week    Comment: occ  . Drug use: No  . Sexual activity: Not on file  Other Topics Concern  . Not on file  Social History Narrative   Patient lives alone and he is single.   Right handed.   Caffeine-    Education- 12 th grade   Disabled.   Social Determinants of Health   Financial Resource Strain:   . Difficulty of Paying Living Expenses: Not on file  Food Insecurity:   . Worried About Charity fundraiser in the Last Year: Not on file  . Ran Out of Food in the Last Year: Not on file  Transportation Needs:   . Lack of Transportation (Medical): Not on file  . Lack of Transportation (Non-Medical): Not on file  Physical Activity:   . Days of Exercise per Week: Not on file  . Minutes of Exercise per Session: Not on file  Stress:   . Feeling of Stress : Not on file  Social Connections:   . Frequency of Communication with Friends and Family: Not on file  . Frequency of Social Gatherings with Friends and Family: Not on file  . Attends Religious Services: Not on file  . Active Member of Clubs or Organizations: Not on file  . Attends Archivist Meetings: Not on file  . Marital Status: Not on file   Past Surgical History:  Procedure Laterality Date  . KNEE SURGERY     Past Medical History:  Diagnosis Date  . Gout   .  Hypertension   . Muscle weakness   . Stroke ([facility])    BP 132/87   Pulse 92   Temp (!) 97.5 F (36.4 C)   Ht 6\' 1"  (1.854 m)   Wt 247 lb (112 kg)   SpO2 96%   BMI 32.59 kg/m   Opioid Risk Score:   Fall Risk Score:  `1  Depression screen PHQ 2/9  Depression screen [facility] 2/9 03/09/2019 12/08/2018 10/24/2018 09/26/2018 03/20/2018 03/03/2018  Decreased Interest 0 0 0 0 0 0  Down, Depressed, Hopeless 0 - 0 0 0 1  PHQ - 2 Score 0 0 0 0 0 1      Review of Systems  Musculoskeletal: Positive for gait problem.  All other systems reviewed and are negative.      Objective:   Physical Exam Constitutional:      Appearance: Normal appearance.  HENT:     Head: Normocephalic and atraumatic.  Eyes:     Extraocular Movements: Extraocular movements intact.     Conjunctiva/sclera:  Conjunctivae normal.     Pupils: Pupils are equal, round, and reactive to light.  Neurological:     General: No focal deficit present.     Mental Status: He is alert and oriented to person, place, and time.  Psychiatric:        Mood and Affect: Mood normal.        Behavior: Behavior normal.       Tone Right elbow flexors MAS 1 Right finger flexors MAS 2 right wrist flexors MAS 2 to MAS 3 Motor strength is 3 - at the deltoid bicep tricep to minus at the finger flexors and extensors as well as wrist flexors and extensors Ambulates without assistive device    Assessment & Plan:  #1.  Right upper extremity spasticity he has had good relief with the botulinum toxin injection would continue with current dosing. Finger stiffness in the morning likely due to bed positioning at night we will get a resting hand splint order for him to take to [facility] Return to clinic in 6 weeks to repeat botulinum toxin injection same muscle group same dosing

## 2019-09-21 ENCOUNTER — Encounter (HOSPITAL_COMMUNITY): Payer: Self-pay | Admitting: Emergency Medicine

## 2019-09-21 ENCOUNTER — Emergency Department (HOSPITAL_COMMUNITY)
Admission: EM | Admit: 2019-09-21 | Discharge: 2019-09-21 | Disposition: A | Discharge status: Home / Self Care | Payer: Medicare Other | Attending: Emergency Medicine | Admitting: Emergency Medicine

## 2019-09-21 ENCOUNTER — Other Ambulatory Visit: Payer: Self-pay

## 2019-09-21 DIAGNOSIS — Z8673 Personal history of transient ischemic attack (TIA), and cerebral infarction without residual deficits: Secondary | ICD-10-CM | POA: Diagnosis not present

## 2019-09-21 DIAGNOSIS — M109 Gout, unspecified: Secondary | ICD-10-CM | POA: Diagnosis not present

## 2019-09-21 DIAGNOSIS — Z79899 Other long term (current) drug therapy: Secondary | ICD-10-CM | POA: Diagnosis not present

## 2019-09-21 DIAGNOSIS — M79642 Pain in left hand: Secondary | ICD-10-CM | POA: Diagnosis present

## 2019-09-21 DIAGNOSIS — Z7982 Long term (current) use of aspirin: Secondary | ICD-10-CM | POA: Insufficient documentation

## 2019-09-21 DIAGNOSIS — I10 Essential (primary) hypertension: Secondary | ICD-10-CM | POA: Diagnosis not present

## 2019-09-21 MED ORDER — METHYLPREDNISOLONE 4 MG PO TBPK: ORAL_TABLET | ORAL | 0 refills | Status: DC

## 2019-09-21 MED ORDER — HYDROCODONE-ACETAMINOPHEN 5-325 MG PO TABS: 1.0000 | ORAL_TABLET | Freq: Four times a day (QID) | ORAL | 0 refills | Status: DC | PRN

## 2019-09-21 NOTE — ED Provider Notes (Signed)
[facility] EMERGENCY DEPARTMENT Provider Note   CSN: [id number] Arrival date & time: 09/21/19  1448     History Chief Complaint  Patient presents with  . Gout    Randall Mccall is a 49 y.o. male.  HPI   Patient is a 49 year old male with a history of gout, hypertension, muscle weakness, stroke, CKD, who presents emergency department today for evaluation of gout.  Patient states that he started to have a gout flare this morning upon waking.  Pain is located to the left hand and wrist. Rates pain "100/10".  He also reports some swelling in the hand.  He has had similar gout flares in the past and states this feels exactly like when he has had gout in the past.  He thinks he ate something last night "that I should not have "which led to his flare today.  Triage note also indicates that patient was requesting labs.  Patient reports that 2 weeks ago he had done a lot of heavy drinking for his birthday.  The following morning he had a coughing episode where he coughed up some blood-tinged sputum.  He denies any gross hematemesis.  Denies chest pain, shortness of breath, pleuritic pain or continued episodes of this.  Denies any nausea or vomiting or hematemesis.  He denies any risk factors for PE.  States that his mom had a panic attack and wanted him to come get checked out for it which is why he brought it up today.  Past Medical History:  Diagnosis Date  . Gout   . Hypertension   . Muscle weakness   . Stroke [facility])     Patient Active Problem List   Diagnosis Date Noted  . CKD (chronic kidney disease) 12/18/2016  . Benign essential HTN   . Idiopathic gout   . Diastolic dysfunction   . Cocaine abuse ([place])   . Spastic hemiparesis of right dominant side ([place])   . 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 Cerebrovascular accident (CVA) ([place])   . Acute ischemic stroke ([place]) 09/15/2016  . Stroke (cerebrum) ([place])  09/15/2016  . Hypertension 09/15/2016  . Vitamin B12 deficiency 08/10/2015  . Weakness 07/02/2013  . Abnormal CPK 07/02/2013    Past Surgical History:  Procedure Laterality Date  . KNEE SURGERY         Family History  Problem Relation Age of Onset  . Hypertension Mother   . Cirrhosis Father     Social History   Tobacco Use  . Smoking status: Never Smoker  . Smokeless tobacco: Never Used  Substance Use Topics  . Alcohol use: Yes    Alcohol/week: 1.0 standard drinks    Types: 1 Cans of beer per week    Comment: occ  . Drug use: No    Home Medications Prior to Admission medications   Medication Sig Start Date End Date Taking? Authorizing Provider  allopurinol (ZYLOPRIM) 100 MG tablet Take 1 tablet (100 mg total) by mouth daily. 10/16/17   Daleen Bo, MD  amLODipine (NORVASC) 10 MG tablet Take 1 tablet (10 mg total) by mouth daily. 06/15/16   Elnora Morrison, MD  aspirin 325 MG tablet Take 1 tablet (325 mg total) by mouth daily. 09/18/16   Nita Sells, MD  atorvastatin (LIPITOR) 80 MG tablet Take 1 tablet (80 mg total) by mouth daily at 6 PM. 09/17/16   Nita Sells, MD  Cholecalciferol (VITAMIN D3) 5000 units TABS  Take 1 tablet by mouth daily. 09/25/17   [provider]  cloNIDine (CATAPRES) 0.1 MG tablet Take 1 tablet (0.1 mg total) by mouth 2 (two) times daily. 06/15/16   Elnora Morrison, MD  colchicine 0.6 MG tablet Take 1 tablet (0.6 mg total) by mouth every hour as needed. Up to 4 in one 24-hour period, if needed for pain 10/16/17   Daleen Bo, MD  gabapentin (NEURONTIN) 300 MG capsule Take 300 mg by mouth at bedtime. 10/09/17   [provider]  HYDROcodone-acetaminophen (NORCO/VICODIN) 5-325 MG tablet Take 1 tablet by mouth every 6 (six) hours as needed. 09/21/19   Donterius Filley S, PA-C  methylPREDNISolone (MEDROL DOSEPAK) 4 MG TBPK tablet Per package instructions 09/21/19   Jimma Ortman S, PA-C  oxyCODONE-acetaminophen  (PERCOCET/ROXICET) 5-325 MG tablet Take 1 tablet by mouth every 8 (eight) hours as needed for severe pain. 07/23/19   Carmin Muskrat, MD  predniSONE (STERAPRED UNI-PAK 21 TAB) 10 MG (21) TBPK tablet Take by mouth daily. Take 6 tabs by mouth daily  for 1 days, then 5 tabs for 2 days, then 4 tabs for 2 days, then 3 tabs for 2 days, 2 tabs for 2 days, then 1 tab by mouth daily for 2 days 05/20/18   Shelly Coss, Hina, PA-C  senna-docusate (SENOKOT-S) 8.6-50 MG tablet Take 1 tablet by mouth at bedtime as needed for mild constipation. 09/17/16   Nita Sells, MD    Allergies    Patient has no known allergies.  Review of Systems   Review of Systems  Constitutional: Negative for chills and fever.  HENT: Negative for ear pain and sore throat.   Eyes: Negative for visual disturbance.  Respiratory: Negative for cough and shortness of breath.   Cardiovascular: Negative for chest pain, palpitations and leg swelling.  Gastrointestinal: Negative for abdominal pain, blood in stool, constipation, diarrhea, nausea and vomiting.  Musculoskeletal:       Left hand pain  Skin: Negative for rash.  Neurological: Negative for weakness and numbness.  All other systems reviewed and are negative.   Physical Exam Updated Vital Signs BP 129/90   Pulse 83   Temp 99 F (37.2 C)   Resp 16   Ht 6\' 1"  (1.854 m)   Wt 111.1 kg   SpO2 100%   BMI 32.32 kg/m   Physical Exam Vitals and nursing note reviewed.  Constitutional:      Appearance: He is well-developed.  HENT:     Head: Normocephalic and atraumatic.  Eyes:     Conjunctiva/sclera: Conjunctivae normal.  Cardiovascular:     Rate and Rhythm: Normal rate and regular rhythm.     Pulses: Normal pulses.     Heart sounds: Normal heart sounds. No murmur.  Pulmonary:     Effort: Pulmonary effort is normal. No respiratory distress.     Breath sounds: Normal breath sounds. No wheezing, rhonchi or rales.  Abdominal:     Palpations: Abdomen is soft.      Tenderness: There is no abdominal tenderness.  Musculoskeletal:     Cervical back: Neck supple.     Comments: Diffuse swelling to the left hand with diffuse tenderness noted to the hand and wrist.  Pain with passive and active range of motion of the fingers and at the wrist.  No obvious joint effusion of the wrist.  No wounds to the hand.  No erythema to the hand or wrist joint.  Normal sensation.  Decreased range of motion to the hand and  wrist secondary to pain.  Neurovascularly intact.  Skin:    General: Skin is warm and dry.  Neurological:     Mental Status: He is alert.     ED Results / Procedures / Treatments   Labs (all labs ordered are listed, but only abnormal results are displayed) Labs Reviewed - No data to display  EKG None  Radiology No results found.  Procedures Procedures (including critical care time)  Medications Ordered in ED Medications - No data to display  ED Course  I have reviewed the triage vital signs and the nursing notes.  Pertinent labs & imaging results that were available during my care of the patient were reviewed by me and considered in my medical decision making (see chart for details).    MDM Rules/Calculators/A&P                       Patient is a 49 year old male with a history of gout, hypertension, muscle weakness, stroke, CKD, who presents emergency department today for evaluation of gout.  Patient states that he started to have a gout flare this morning upon waking.  Pain is located to the left hand and wrist. Rates pain "100/10".  He also reports some swelling in the hand.  He has had similar gout flares in the past and states this feels exactly like when he has had gout in the past.  He thinks he ate something last night "that I should not have "which led to his flare today.  On exam patient does have swelling to the left hand and wrist with warmth and pain with movement.  He does not have any significant swelling to the wrist joint  itself to suggest septic arthritis.  He is afebrile here with normal vital signs.  He denies fevers at home.  Suspect symptoms are related to uncomplicated gout flare.  Will treat with Medrol Dosepak and pain medication.  I did give him strict precautions of his symptoms not improving the next 24 to 48 hours that he will need to return to the ED for recheck.  Patient also had an episode where he coughed up some blood-tinged sputum 2 weeks ago after a night of heavy drinking.  He said no further episodes.  No risk factors for PE.  No hematemesis nausea or vomiting.  He is not have any pleuritic pain or symptoms to suggest that he has a PE at this time.  I do not think this requires emergent work-up but I did advise that he follow-up with his PCP about this and return for any new or worsening symptoms.  He voices understanding the plan and is comfortable with this.  All questions answered.  Patient stable for discharge.  Final Clinical Impression(s) / ED Diagnoses Final diagnoses:  Acute gout, unspecified cause, unspecified site    Rx / DC Orders ED Discharge Orders         Ordered    methylPREDNISolone (MEDROL DOSEPAK) 4 MG TBPK tablet     09/21/19 1614    HYDROcodone-acetaminophen (NORCO/VICODIN) 5-325 MG tablet  Every 6 hours PRN     09/21/19 1614           Jaydrien Wassenaar S, PA-C 09/21/19 1620    Tegeler, Gwenyth Allegra, MD 09/21/19 [phone]

## 2019-09-21 NOTE — ED Notes (Signed)
Patient verbalizes understanding of discharge instructions. Opportunity for questioning and answers were provided. Armband removed by staff, pt discharged from ED.  

## 2019-09-21 NOTE — ED Triage Notes (Signed)
Pt here c/o generalized bodyaches related to gout and drinking alcohol on his birthday. Also requesting lab testing because he noted some blood in his emesis. Pt alert, oriented x4. Vss.

## 2019-09-21 NOTE — Discharge Instructions (Addendum)
Take medications as directed.   Prescription given for Norco. Take medication as directed and do not operate machinery, drive a car, or work while taking this medication as it can make you drowsy.   Please follow up with your primary care provider within 5-7 days for re-evaluation of your symptoms. If you do not have a primary care provider, information for a healthcare clinic has been provided for you to make arrangements for follow up care. Please return to the emergency department for any new or worsening symptoms.

## 2019-09-25 DIAGNOSIS — I10 Essential (primary) hypertension: Secondary | ICD-10-CM | POA: Diagnosis not present

## 2019-09-25 DIAGNOSIS — M10042 Idiopathic gout, left hand: Secondary | ICD-10-CM | POA: Diagnosis not present

## 2019-09-25 DIAGNOSIS — E7800 Pure hypercholesterolemia, unspecified: Secondary | ICD-10-CM | POA: Diagnosis not present

## 2019-09-25 DIAGNOSIS — I639 Cerebral infarction, unspecified: Secondary | ICD-10-CM | POA: Diagnosis not present

## 2019-09-25 DIAGNOSIS — G629 Polyneuropathy, unspecified: Secondary | ICD-10-CM | POA: Diagnosis not present

## 2019-09-25 DIAGNOSIS — R600 Localized edema: Secondary | ICD-10-CM | POA: Diagnosis not present

## 2019-09-25 DIAGNOSIS — M21331 Wrist drop, right wrist: Secondary | ICD-10-CM | POA: Diagnosis not present

## 2019-11-03 ENCOUNTER — Other Ambulatory Visit: Payer: Self-pay

## 2019-11-03 ENCOUNTER — Encounter: Payer: Self-pay | Admitting: Physical Medicine & Rehabilitation

## 2019-11-03 ENCOUNTER — Encounter: Payer: Medicare Other | Attending: Physical Medicine & Rehabilitation | Admitting: Physical Medicine & Rehabilitation

## 2019-11-03 VITALS — BP 120/81 | HR 89 | Temp 97.5°F | Ht 73.0 in | Wt 255.8 lb

## 2019-11-03 DIAGNOSIS — G8111 Spastic hemiplegia affecting right dominant side: Secondary | ICD-10-CM | POA: Diagnosis not present

## 2019-11-03 NOTE — Progress Notes (Signed)
Botox Injection for spasticity using needle EMG guidance  Dilution: 50 Units/ml Indication: Severe spasticity which interferes with ADL,mobility and/or  hygiene and is unresponsive to medication management and other conservative care Informed consent was obtained after describing risks and benefits of the procedure with the patient. This includes bleeding, bruising, infection, excessive weakness, or medication side effects. A REMS form is on file and signed. Needle: 25g 2" needle electrode-  Number of units per muscle Botox FCR50 FCU50 FDS50 FDP50  PQ 50 PT 50 All injections were done after obtaining appropriate EMG activity and after negative drawback for blood. The patient tolerated the procedure well. Post procedure instructions were given. A followup appointment was made.       

## 2019-11-03 NOTE — Patient Instructions (Signed)

## 2019-11-10 DIAGNOSIS — Z5181 Encounter for therapeutic drug level monitoring: Secondary | ICD-10-CM | POA: Diagnosis not present

## 2019-11-10 DIAGNOSIS — R7303 Prediabetes: Secondary | ICD-10-CM | POA: Diagnosis not present

## 2019-11-10 DIAGNOSIS — Z01021 Encounter for examination of eyes and vision following failed vision screening with abnormal findings: Secondary | ICD-10-CM | POA: Diagnosis not present

## 2019-11-10 DIAGNOSIS — Z136 Encounter for screening for cardiovascular disorders: Secondary | ICD-10-CM | POA: Diagnosis not present

## 2019-11-10 DIAGNOSIS — E7800 Pure hypercholesterolemia, unspecified: Secondary | ICD-10-CM | POA: Diagnosis not present

## 2019-11-10 DIAGNOSIS — Z1329 Encounter for screening for other suspected endocrine disorder: Secondary | ICD-10-CM | POA: Diagnosis not present

## 2019-11-10 DIAGNOSIS — Z131 Encounter for screening for diabetes mellitus: Secondary | ICD-10-CM | POA: Diagnosis not present

## 2019-11-10 DIAGNOSIS — I10 Essential (primary) hypertension: Secondary | ICD-10-CM | POA: Diagnosis not present

## 2019-11-10 DIAGNOSIS — Z01118 Encounter for examination of ears and hearing with other abnormal findings: Secondary | ICD-10-CM | POA: Diagnosis not present

## 2019-11-24 DIAGNOSIS — I10 Essential (primary) hypertension: Secondary | ICD-10-CM | POA: Diagnosis not present

## 2019-11-24 DIAGNOSIS — R739 Hyperglycemia, unspecified: Secondary | ICD-10-CM | POA: Diagnosis not present

## 2019-11-24 DIAGNOSIS — E7800 Pure hypercholesterolemia, unspecified: Secondary | ICD-10-CM | POA: Diagnosis not present

## 2019-11-24 DIAGNOSIS — R7989 Other specified abnormal findings of blood chemistry: Secondary | ICD-10-CM | POA: Diagnosis not present

## 2019-11-24 DIAGNOSIS — R600 Localized edema: Secondary | ICD-10-CM | POA: Diagnosis not present

## 2019-11-24 DIAGNOSIS — G629 Polyneuropathy, unspecified: Secondary | ICD-10-CM | POA: Diagnosis not present

## 2019-12-15 ENCOUNTER — Ambulatory Visit: Payer: Medicare Other | Admitting: Physical Medicine & Rehabilitation

## 2019-12-18 DIAGNOSIS — E7800 Pure hypercholesterolemia, unspecified: Secondary | ICD-10-CM | POA: Diagnosis not present

## 2019-12-18 DIAGNOSIS — R7989 Other specified abnormal findings of blood chemistry: Secondary | ICD-10-CM | POA: Diagnosis not present

## 2019-12-18 DIAGNOSIS — I10 Essential (primary) hypertension: Secondary | ICD-10-CM | POA: Diagnosis not present

## 2019-12-18 DIAGNOSIS — G629 Polyneuropathy, unspecified: Secondary | ICD-10-CM | POA: Diagnosis not present

## 2019-12-18 DIAGNOSIS — Z0001 Encounter for general adult medical examination with abnormal findings: Secondary | ICD-10-CM | POA: Diagnosis not present

## 2019-12-22 ENCOUNTER — Encounter: Payer: Self-pay | Admitting: Physical Medicine & Rehabilitation

## 2019-12-22 ENCOUNTER — Other Ambulatory Visit: Payer: Self-pay

## 2019-12-22 ENCOUNTER — Encounter: Payer: Medicare Other | Attending: Physical Medicine & Rehabilitation | Admitting: Physical Medicine & Rehabilitation

## 2019-12-22 VITALS — BP 121/79 | HR 84 | Temp 97.5°F | Ht 73.0 in | Wt 255.0 lb

## 2019-12-22 DIAGNOSIS — G8111 Spastic hemiplegia affecting right dominant side: Secondary | ICD-10-CM | POA: Insufficient documentation

## 2019-12-22 DIAGNOSIS — M21371 Foot drop, right foot: Secondary | ICD-10-CM | POA: Diagnosis not present

## 2019-12-22 NOTE — Patient Instructions (Signed)
Botox next visit  Please call [facility] for the right foot/ankle brace

## 2019-12-22 NOTE — Progress Notes (Signed)
Subjective:    Patient ID: Randall Mccall, male    DOB: 07-02-1971, 49 y.o.   MRN: [id number] L PLIC-  Infarct [id number] with Chronic Right Hemiparesis, risk factors include hypertension as well as cocaine abuse. The patient underwent neurological evaluation and was transferred to a skilled nursing facility on 09/18/2016.  Patient remained in the skilled nursing facility until 10/24/2016 and was discharged with a quad cane, shower chair and 3 in 1 commode. HPI Modified independent with all self-care and mobility.  He does have problems with dragging his right foot and has not heard anything about a brace that was prescribed. Patient returns 6 weeks after botulinum toxin injections of the following muscle groups Botox right upper extremity FCR50 FCU50 FDS50 FDP50  PQ 50 PT 50  The patient tolerated procedure well.  He had no postinjection complications. Patient feels like his fingers and wrist are looser now. Pain Inventory Average Pain 0 Pain Right Now 0 My pain is no pain  In the last 24 hours, has pain interfered with the following? General activity 0 Relation with others 0 Enjoyment of life 0 What TIME of day is your pain at its worst? no pain Sleep (in general) Good  Pain is worse with: no pain Pain improves with: no pain Relief from Meds: no pain  Mobility walk without assistance ability to climb steps?  yes  Function disabled: date disabled .  Neuro/Psych No problems in this area  Prior Studies Any changes since last visit?  no  Physicians involved in your care Any changes since last visit?  no   Family History  Problem Relation Age of Onset  . Hypertension Mother   . Cirrhosis Father    Social History   Socioeconomic History  . Marital status: Single    Spouse name: Not on file  . Number of children: 0  . Years of education: 24  . Highest education level: Not on file  Occupational History    Comment: Disabled  Tobacco Use  . Smoking status:  Never Smoker  . Smokeless tobacco: Never Used  Substance and Sexual Activity  . Alcohol use: Yes    Alcohol/week: 1.0 standard drinks    Types: 1 Cans of beer per week    Comment: occ  . Drug use: No  . Sexual activity: Not on file  Other Topics Concern  . Not on file  Social History Narrative   Patient lives alone and he is single.   Right handed.   Caffeine-    Education- 12 th grade   Disabled.   Social Determinants of Health   Financial Resource Strain:   . Difficulty of Paying Living Expenses:   Food Insecurity:   . Worried About Charity fundraiser in the Last Year:   . Arboriculturist in the Last Year:   Transportation Needs:   . Film/video editor (Medical):   Marland Kitchen Lack of Transportation (Non-Medical):   Physical Activity:   . Days of Exercise per Week:   . Minutes of Exercise per Session:   Stress:   . Feeling of Stress :   Social Connections:   . Frequency of Communication with Friends and Family:   . Frequency of Social Gatherings with Friends and Family:   . Attends Religious Services:   . Active Member of Clubs or Organizations:   . Attends Archivist Meetings:   Marland Kitchen Marital Status:    Past Surgical History:  Procedure Laterality Date  .  KNEE SURGERY     Past Medical History:  Diagnosis Date  . Gout   . Hypertension   . Muscle weakness   . Stroke ([facility])    BP 121/79   Pulse 84   Temp (!) 97.5 F (36.4 C)   Ht 6\' 1"  (1.854 m)   Wt 255 lb (115.7 kg)   SpO2 96%   BMI 33.64 kg/m   Opioid Risk Score:   Fall Risk Score:  `1  Depression screen PHQ 2/9  Depression screen [facility] 2/9 03/09/2019 12/08/2018 10/24/2018 09/26/2018 03/20/2018 03/03/2018  Decreased Interest 0 0 0 0 0 0  Down, Depressed, Hopeless 0 - 0 0 0 1  PHQ - 2 Score 0 0 0 0 0 1    Review of Systems  Constitutional: Negative.   HENT: Negative.   Eyes: Negative.   Respiratory: Negative.   Cardiovascular: Negative.   Gastrointestinal: Negative.   Endocrine: Negative.     Genitourinary: Negative.   Musculoskeletal: Negative.   Skin: Negative.   Allergic/Immunologic: Negative.   Neurological: Negative.   Hematological: Negative.   Psychiatric/Behavioral: Negative.   All other systems reviewed and are negative.      Objective:   Physical Exam Vitals and nursing note reviewed.  Constitutional:      Appearance: Normal appearance.  HENT:     Head: Normocephalic and atraumatic.  Eyes:     General: No scleral icterus.       Right eye: No discharge.        Left eye: No discharge.     Extraocular Movements: Extraocular movements intact.     Conjunctiva/sclera: Conjunctivae normal.     Pupils: Pupils are equal, round, and reactive to light.  Skin:    General: Skin is warm and dry.  Neurological:     General: No focal deficit present.     Mental Status: He is alert and oriented to person, place, and time.  Psychiatric:        Mood and Affect: Mood normal.        Behavior: Behavior normal.        Thought Content: Thought content normal.    The patient has no pain with right upper extremity range of motion Motor strength 3 - at the deltoid finger flexors trace finger extensors to minus triceps 3 - biceps on the right Right lower extremity 4/5 in the hip flexor knee extensor 3 - ankle dorsiflexor 5/5 strength in left-sided upper cavity and lower extremity. He ambulates with a right foot drag some tendency towards foot inversion as well.  No knee instability noted       Assessment & Plan:  #1.  Left PICA infarct with chronic right spastic hemiparesis.  He has done well after botulinum toxin injection right extremity.  I would recommend repeating with the same dose and muscle group selection in 6 weeks.  He has been on a stable dose now for last several visits and no longer will need a 6-week follow-up.  2.  Right foot drop with underlying spastic hemiparesis from CVA have written a off-the-shelf carbon fiber AFO from [facility].

## 2020-01-04 DIAGNOSIS — I69351 Hemiplegia and hemiparesis following cerebral infarction affecting right dominant side: Secondary | ICD-10-CM | POA: Diagnosis not present

## 2020-01-04 DIAGNOSIS — I672 Cerebral atherosclerosis: Secondary | ICD-10-CM | POA: Diagnosis not present

## 2020-01-04 DIAGNOSIS — E785 Hyperlipidemia, unspecified: Secondary | ICD-10-CM | POA: Diagnosis not present

## 2020-01-04 DIAGNOSIS — I10 Essential (primary) hypertension: Secondary | ICD-10-CM | POA: Diagnosis not present

## 2020-01-11 DIAGNOSIS — T162XXA Foreign body in left ear, initial encounter: Secondary | ICD-10-CM | POA: Diagnosis not present

## 2020-01-11 DIAGNOSIS — H6123 Impacted cerumen, bilateral: Secondary | ICD-10-CM | POA: Diagnosis not present

## 2020-01-29 DIAGNOSIS — I6521 Occlusion and stenosis of right carotid artery: Secondary | ICD-10-CM | POA: Diagnosis not present

## 2020-01-29 DIAGNOSIS — I672 Cerebral atherosclerosis: Secondary | ICD-10-CM | POA: Diagnosis not present

## 2020-02-04 ENCOUNTER — Encounter: Payer: Self-pay | Admitting: Physical Medicine & Rehabilitation

## 2020-02-04 ENCOUNTER — Other Ambulatory Visit: Payer: Self-pay

## 2020-02-04 ENCOUNTER — Encounter: Payer: Medicare Other | Attending: Physical Medicine & Rehabilitation | Admitting: Physical Medicine & Rehabilitation

## 2020-02-04 VITALS — BP 124/78 | HR 92 | Temp 97.5°F | Ht 73.0 in | Wt 257.0 lb

## 2020-02-04 DIAGNOSIS — G8111 Spastic hemiplegia affecting right dominant side: Secondary | ICD-10-CM | POA: Diagnosis not present

## 2020-02-04 NOTE — Patient Instructions (Signed)
Graduated return to driving instructions were provided. It is recommended that the patient first drives with another licensed driver in an empty parking lot. If the patient does well with this, and they can drive on a quiet street with the licensed driver. If the patient does well with this they can drive on a busy street with a licensed driver. If the patient does well with this, the next time out they can go by himself. For the first month after resuming driving, I recommend no nighttime or Interstate driving.   You received a Botox injection today. You may experience soreness at the needle injection sites. Please call us if any of the injection sites turns red after a couple days or if there is any drainage. You may experience muscle weakness as a result of Botox. This would improve with time but can take several weeks to improve. The Botox should start working in about one week. The Botox usually last 3 months. The injection can be repeated every 3 months as needed.

## 2020-02-04 NOTE — Progress Notes (Signed)
Botox Injection for spasticity using needle EMG guidance  Dilution: 50 Units/ml Indication: Severe spasticity which interferes with ADL,mobility and/or  hygiene and is unresponsive to medication management and other conservative care Informed consent was obtained after describing risks and benefits of the procedure with the patient. This includes bleeding, bruising, infection, excessive weakness, or medication side effects. A REMS form is on file and signed. Needle: 25g 2" needle electrode-  Number of units per muscle Botox FCR50 FCU50 FDS50 FDP50  PQ 50 PT 50 All injections were done after obtaining appropriate EMG activity and after negative drawback for blood. The patient tolerated the procedure well. Post procedure instructions were given. A followup appointment was made.       

## 2020-03-10 ENCOUNTER — Encounter (HOSPITAL_COMMUNITY): Payer: Self-pay

## 2020-03-10 ENCOUNTER — Other Ambulatory Visit: Payer: Self-pay

## 2020-03-10 ENCOUNTER — Emergency Department (HOSPITAL_COMMUNITY)
Admission: EM | Admit: 2020-03-10 | Discharge: 2020-03-11 | Disposition: A | Discharge status: Home / Self Care | Payer: Medicare Other | Attending: Emergency Medicine | Admitting: Emergency Medicine

## 2020-03-10 DIAGNOSIS — I130 Hypertensive heart and chronic kidney disease with heart failure and stage 1 through stage 4 chronic kidney disease, or unspecified chronic kidney disease: Secondary | ICD-10-CM | POA: Diagnosis not present

## 2020-03-10 DIAGNOSIS — N189 Chronic kidney disease, unspecified: Secondary | ICD-10-CM | POA: Diagnosis not present

## 2020-03-10 DIAGNOSIS — Z8739 Personal history of other diseases of the musculoskeletal system and connective tissue: Secondary | ICD-10-CM | POA: Diagnosis not present

## 2020-03-10 DIAGNOSIS — M25512 Pain in left shoulder: Secondary | ICD-10-CM | POA: Diagnosis not present

## 2020-03-10 DIAGNOSIS — Z79899 Other long term (current) drug therapy: Secondary | ICD-10-CM | POA: Diagnosis not present

## 2020-03-10 DIAGNOSIS — I5030 Unspecified diastolic (congestive) heart failure: Secondary | ICD-10-CM | POA: Insufficient documentation

## 2020-03-10 DIAGNOSIS — R52 Pain, unspecified: Secondary | ICD-10-CM | POA: Diagnosis present

## 2020-03-10 DIAGNOSIS — Z7982 Long term (current) use of aspirin: Secondary | ICD-10-CM | POA: Diagnosis not present

## 2020-03-10 DIAGNOSIS — M2550 Pain in unspecified joint: Secondary | ICD-10-CM | POA: Diagnosis not present

## 2020-03-10 DIAGNOSIS — M25511 Pain in right shoulder: Secondary | ICD-10-CM | POA: Diagnosis not present

## 2020-03-10 NOTE — ED Triage Notes (Signed)
Pt presents via POV c/o left hand/arm pain. Reports gout flare. Ran out of gout medication per pt report.

## 2020-03-11 DIAGNOSIS — M2550 Pain in unspecified joint: Secondary | ICD-10-CM | POA: Diagnosis not present

## 2020-03-11 LAB — CBC WITH DIFFERENTIAL/PLATELET
Abs Immature Granulocytes: 0.02 10*3/uL (ref 0.00–0.07)
Basophils Absolute: 0.1 10*3/uL (ref 0.0–0.1)
Basophils Relative: 1 %
Eosinophils Absolute: 0.5 10*3/uL (ref 0.0–0.5)
Eosinophils Relative: 7 %
HCT: 48.8 % (ref 39.0–52.0)
Hemoglobin: 15.4 g/dL (ref 13.0–17.0)
Immature Granulocytes: 0 %
Lymphocytes Relative: 22 %
Lymphs Abs: 1.6 10*3/uL (ref 0.7–4.0)
MCH: 30.2 pg (ref 26.0–34.0)
MCHC: 31.6 g/dL (ref 30.0–36.0)
MCV: 95.7 fL (ref 80.0–100.0)
Monocytes Absolute: 0.6 10*3/uL (ref 0.1–1.0)
Monocytes Relative: 9 %
Neutro Abs: 4.3 10*3/uL (ref 1.7–7.7)
Neutrophils Relative %: 61 %
Platelets: 246 10*3/uL (ref 150–400)
RBC: 5.1 MIL/uL (ref 4.22–5.81)
RDW: 13.5 % (ref 11.5–15.5)
WBC: 7.1 10*3/uL (ref 4.0–10.5)
nRBC: 0 % (ref 0.0–0.2)

## 2020-03-11 LAB — COMPREHENSIVE METABOLIC PANEL
ALT: 40 U/L (ref 0–44)
AST: 52 U/L — ABNORMAL HIGH (ref 15–41)
Albumin: 3.9 g/dL (ref 3.5–5.0)
Alkaline Phosphatase: 74 U/L (ref 38–126)
Anion gap: 14 (ref 5–15)
BUN: 16 mg/dL (ref 6–20)
CO2: 19 mmol/L — ABNORMAL LOW (ref 22–32)
Calcium: 9.4 mg/dL (ref 8.9–10.3)
Chloride: 106 mmol/L (ref 98–111)
Creatinine, Ser: 1.35 mg/dL — ABNORMAL HIGH (ref 0.61–1.24)
GFR calc Af Amer: >60 mL/min (ref >60)
GFR calc non Af Amer: >60 mL/min (ref >60)
Glucose, Bld: 113 mg/dL — ABNORMAL HIGH (ref 70–99)
Potassium: 4.1 mmol/L (ref 3.5–5.1)
Sodium: 139 mmol/L (ref 135–145)
Total Bilirubin: 0.4 mg/dL (ref 0.3–1.2)
Total Protein: 7.3 g/dL (ref 6.5–8.1)

## 2020-03-11 MED ORDER — OXYCODONE-ACETAMINOPHEN 5-325 MG PO TABS: 1.0000 | ORAL_TABLET | Freq: Four times a day (QID) | ORAL | 0 refills | Status: DC | PRN

## 2020-03-11 MED ORDER — OXYCODONE-ACETAMINOPHEN 5-325 MG PO TABS
2.0000 | ORAL_TABLET | Freq: Once | ORAL | Status: AC
  Administered 2020-03-11: 2 via ORAL
  Filled 2020-03-11: qty 2

## 2020-03-11 NOTE — Discharge Instructions (Addendum)
Take Percocet for pain as directed. Follow up with your doctor this week for further management of painful joints.

## 2020-03-11 NOTE — ED Provider Notes (Signed)
[facility] EMERGENCY DEPARTMENT Provider Note   CSN: [id number] Arrival date & time: 03/10/20  2025     History Chief Complaint  Patient presents with  . Gout    Randall Mccall is a 49 y.o. male.  Patient to ED with complaint of 'gout' pain. He reports pain that started in his left hand and wrist yesterday that now is widespread including left posterior ankle, left shoulder, right ankle and bilateral knees. No fever. He takes colchicine and indomethacin daily to prevent gout recurrence. No myalgias, nausea, vomiting, chest or abdominal pain.  The history is provided by the patient. No language interpreter was used.       Past Medical History:  Diagnosis Date  . Gout   . Hypertension   . Muscle weakness   . Stroke [facility])     Patient Active Problem List   Diagnosis Date Noted  . Acquired right foot drop 12/22/2019  . CKD (chronic kidney disease) 12/18/2016  . Benign essential HTN   . Idiopathic gout   . Diastolic dysfunction   . Cocaine abuse ([place])   . Spastic hemiparesis of right dominant side ([place])   . 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 Cerebrovascular accident (CVA) ([place])   . Acute ischemic stroke ([place]) 09/15/2016  . Stroke (cerebrum) ([place]) 09/15/2016  . Hypertension 09/15/2016  . Vitamin B12 deficiency 08/10/2015  . Weakness 07/02/2013  . Abnormal CPK 07/02/2013    Past Surgical History:  Procedure Laterality Date  . KNEE SURGERY         Family History  Problem Relation Age of Onset  . Hypertension Mother   . Cirrhosis Father     Social History   Tobacco Use  . Smoking status: Never Smoker  . Smokeless tobacco: Never Used  Vaping Use  . Vaping Use: Never used  Substance Use Topics  . Alcohol use: Yes    Alcohol/week: 1.0 standard drink    Types: 1 Cans of beer per week    Comment: occ  . Drug use: No    Home Medications Prior to Admission medications     Medication Sig Start Date End Date Taking? Authorizing Provider  allopurinol (ZYLOPRIM) 100 MG tablet Take 1 tablet (100 mg total) by mouth daily. 10/16/17   Daleen Bo, MD  amLODipine (NORVASC) 10 MG tablet Take 1 tablet (10 mg total) by mouth daily. 06/15/16   Elnora Morrison, MD  aspirin 325 MG tablet Take 1 tablet (325 mg total) by mouth daily. 09/18/16   Nita Sells, MD  atorvastatin (LIPITOR) 80 MG tablet Take 1 tablet (80 mg total) by mouth daily at 6 PM. 09/17/16   Nita Sells, MD  Cholecalciferol (VITAMIN D3) 5000 units TABS Take 1 tablet by mouth daily. 09/25/17   [provider]  cloNIDine (CATAPRES) 0.1 MG tablet Take 1 tablet (0.1 mg total) by mouth 2 (two) times daily. 06/15/16   Elnora Morrison, MD  colchicine 0.6 MG tablet Take 1 tablet (0.6 mg total) by mouth every hour as needed. Up to 4 in one 24-hour period, if needed for pain 10/16/17   Daleen Bo, MD  gabapentin (NEURONTIN) 300 MG capsule Take 300 mg by mouth at bedtime. 10/09/17   [provider]  HYDROcodone-acetaminophen (NORCO/VICODIN) 5-325 MG tablet Take 1 tablet by mouth every 6 (six) hours as needed. Patient not taking: Reported on 02/04/2020 09/21/19   Couture, Cortni S, PA-C  methylPREDNISolone (MEDROL DOSEPAK) 4  MG TBPK tablet Per package instructions Patient not taking: Reported on 02/04/2020 09/21/19   Couture, Cortni S, PA-C  oxyCODONE-acetaminophen (PERCOCET/ROXICET) 5-325 MG tablet Take 1 tablet by mouth every 8 (eight) hours as needed for severe pain. Patient not taking: Reported on 02/04/2020 07/23/19   Carmin Muskrat, MD  predniSONE (STERAPRED UNI-PAK 21 TAB) 10 MG (21) TBPK tablet Take by mouth daily. Take 6 tabs by mouth daily  for 1 days, then 5 tabs for 2 days, then 4 tabs for 2 days, then 3 tabs for 2 days, 2 tabs for 2 days, then 1 tab by mouth daily for 2 days 05/20/18   Shelly Coss, Hina, PA-C  senna-docusate (SENOKOT-S) 8.6-50 MG tablet Take 1 tablet by mouth at bedtime as  needed for mild constipation. 09/17/16   Nita Sells, MD    Allergies    Patient has no known allergies.  Review of Systems   Review of Systems  Constitutional: Negative for chills and fever.  Gastrointestinal: Negative.  Negative for abdominal pain.  Musculoskeletal: Positive for arthralgias.  Skin: Negative.   Neurological: Negative.     Physical Exam Updated Vital Signs BP (!) 148/108 (BP Location: Left Arm)   Pulse 84   Temp 98.1 F (36.7 C) (Oral)   Resp 20   Wt 113.4 kg   SpO2 98%   BMI 32.98 kg/m   Physical Exam Vitals and nursing note reviewed.  Constitutional:      General: He is not in acute distress.    Appearance: He is well-developed. He is not ill-appearing.  Cardiovascular:     Pulses: Normal pulses.  Pulmonary:     Effort: Pulmonary effort is normal.  Musculoskeletal:        General: Normal range of motion.     Cervical back: Normal range of motion.     Comments: Left thumb and wrist with moderate swelling. Tender. No redness or warmth. Neurovascularly intact. Right ankle tender without significant swelling or redness. Left ankle tender posteriorly only. No swelling or redness. Knees bilaterally unremarkable and nontender. Left shoulder unremarkable without swelling or significant tenderness.   Skin:    General: Skin is warm and dry.  Neurological:     Mental Status: He is alert and oriented to person, place, and time.     ED Results / Procedures / Treatments   Labs (all labs ordered are listed, but only abnormal results are displayed) Labs Reviewed - No data to display  EKG None  Radiology No results found.  Procedures Procedures (including critical care time)  Medications Ordered in ED Medications  oxyCODONE-acetaminophen (PERCOCET/ROXICET) 5-325 MG per tablet 2 tablet (has no administration in time range)    ED Course  I have reviewed the triage vital signs and the nursing notes.  Pertinent labs & imaging results that  were available during my care of the patient were reviewed by me and considered in my medical decision making (see chart for details).    MDM Rules/Calculators/A&P                          Patient with history of gout presents with diffuse joint pain starting in the left wrist yesterday. He takes colchicine and indomethacin daily and reports running out of colchicine yesterday. No fever.   Labs performed for evaluation of polyarthralgia and are essentially unremarkable. Pain improved with Percocet.   He is encouraged to continue indomethacin as prescribed, and see his doctor to discuss ongoing treatment.  Will give #10 Percocet.    Final Clinical Impression(s) / ED Diagnoses Final diagnoses:  None   1. Arthralgias  Rx / DC Orders ED Discharge Orders    None       Dennie Bible 03/11/20 0631    Mesner, Corene Cornea, MD 03/11/20 2330

## 2020-03-16 ENCOUNTER — Encounter (HOSPITAL_COMMUNITY): Payer: Self-pay

## 2020-03-16 ENCOUNTER — Other Ambulatory Visit: Payer: Self-pay

## 2020-03-16 ENCOUNTER — Emergency Department (HOSPITAL_COMMUNITY)
Admission: EM | Admit: 2020-03-16 | Discharge: 2020-03-16 | Disposition: A | Discharge status: Home / Self Care | Payer: Medicare Other | Attending: Emergency Medicine | Admitting: Emergency Medicine

## 2020-03-16 DIAGNOSIS — N189 Chronic kidney disease, unspecified: Secondary | ICD-10-CM | POA: Insufficient documentation

## 2020-03-16 DIAGNOSIS — Z7982 Long term (current) use of aspirin: Secondary | ICD-10-CM | POA: Diagnosis not present

## 2020-03-16 DIAGNOSIS — M109 Gout, unspecified: Secondary | ICD-10-CM

## 2020-03-16 DIAGNOSIS — Z79899 Other long term (current) drug therapy: Secondary | ICD-10-CM | POA: Insufficient documentation

## 2020-03-16 DIAGNOSIS — I129 Hypertensive chronic kidney disease with stage 1 through stage 4 chronic kidney disease, or unspecified chronic kidney disease: Secondary | ICD-10-CM | POA: Diagnosis not present

## 2020-03-16 DIAGNOSIS — M25532 Pain in left wrist: Secondary | ICD-10-CM | POA: Diagnosis present

## 2020-03-16 MED ORDER — PREDNISONE 20 MG PO TABS
40.0000 mg | ORAL_TABLET | Freq: Once | ORAL | Status: AC
  Administered 2020-03-16: 40 mg via ORAL
  Filled 2020-03-16: qty 2

## 2020-03-16 MED ORDER — PREDNISONE 10 MG PO TABS
40.0000 mg | ORAL_TABLET | Freq: Every day | ORAL | 0 refills | Status: AC
Start: 2020-03-16 — End: 2020-03-19

## 2020-03-16 NOTE — ED Provider Notes (Addendum)
[facility] DEPT Provider Note   CSN: [id number] Arrival date & time: 03/16/20  1103     History No chief complaint on file.   Randall Mccall is a 49 y.o. male with pertinent past medical history of gout, hypertension that presents the emergency department today for evaluation of his gout.  Patient was seen at [facility] ED on 7/16 for the same.  Was prescribed Percocet, and discharged for gout flare.  Patient is currently taking indomethacin and colchine.  Patient states that he came into the ER today because he ate pork 2 days ago, flare started again. Said he came in because last provider was supposed to prescribe him steroids, but he did not get the prescription.  Patient states that he does not think that flare completely went away from previous flare when he presented to the ER on 7/16.  States that he normally gets gout in his hand and knees, this is where he is having pain now.  Specifically his left knee and left hand. Patient denies any fevers, chills, nausea, vomiting, tick bites, rash, headache, vision changes, chest pain, shortness of breath.  Patient states that this feels exactly the same as his normal gout flares.  Patient states that he had gout for 20 years, normally gets a flare every 1 month after he eats pork.  Denies any alcohol use.  Is going to see his PCP in 2 days.  No trauma to this area.  HPI     Past Medical History:  Diagnosis Date  . Gout   . Hypertension   . Muscle weakness   . Stroke [facility])     Patient Active Problem List   Diagnosis Date Noted  . Acquired right foot drop 12/22/2019  . CKD (chronic kidney disease) 12/18/2016  . Benign essential HTN   . Idiopathic gout   . Diastolic dysfunction   . Cocaine abuse ([place])   . Spastic hemiparesis of right dominant side ([place])   . 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 Cerebrovascular accident (CVA) ([place])   . Acute  ischemic stroke ([place]) 09/15/2016  . Stroke (cerebrum) ([place]) 09/15/2016  . Hypertension 09/15/2016  . Vitamin B12 deficiency 08/10/2015  . Weakness 07/02/2013  . Abnormal CPK 07/02/2013    Past Surgical History:  Procedure Laterality Date  . KNEE SURGERY         Family History  Problem Relation Age of Onset  . Hypertension Mother   . Cirrhosis Father     Social History   Tobacco Use  . Smoking status: Never Smoker  . Smokeless tobacco: Never Used  Vaping Use  . Vaping Use: Never used  Substance Use Topics  . Alcohol use: Yes    Alcohol/week: 1.0 standard drink    Types: 1 Cans of beer per week    Comment: occ  . Drug use: No    Home Medications Prior to Admission medications   Medication Sig Start Date End Date Taking? Authorizing Provider  allopurinol (ZYLOPRIM) 100 MG tablet Take 1 tablet (100 mg total) by mouth daily. 10/16/17   Daleen Bo, MD  amLODipine (NORVASC) 10 MG tablet Take 1 tablet (10 mg total) by mouth daily. 06/15/16   Elnora Morrison, MD  aspirin 325 MG tablet Take 1 tablet (325 mg total) by mouth daily. 09/18/16   Nita Sells, MD  atorvastatin (LIPITOR) 80 MG tablet Take 1 tablet (80 mg total) by mouth daily at  6 PM. 09/17/16   Nita Sells, MD  Cholecalciferol (VITAMIN D3) 5000 units TABS Take 1 tablet by mouth daily. 09/25/17   [provider]  cloNIDine (CATAPRES) 0.1 MG tablet Take 1 tablet (0.1 mg total) by mouth 2 (two) times daily. 06/15/16   Elnora Morrison, MD  colchicine 0.6 MG tablet Take 1 tablet (0.6 mg total) by mouth every hour as needed. Up to 4 in one 24-hour period, if needed for pain 10/16/17   Daleen Bo, MD  ezetimibe (ZETIA) 10 MG tablet Take 10 mg by mouth daily. 01/24/20   [provider]  furosemide (LASIX) 20 MG tablet Take 20-40 mg by mouth daily. 02/08/20   [provider]  gabapentin (NEURONTIN) 300 MG capsule Take 300 mg by mouth at bedtime. 10/09/17   [provider]    HYDROcodone-acetaminophen (NORCO/VICODIN) 5-325 MG tablet Take 1 tablet by mouth every 6 (six) hours as needed. Patient not taking: Reported on 02/04/2020 09/21/19   Couture, Cortni S, PA-C  indomethacin (INDOCIN) 50 MG capsule Take 50 mg by mouth 2 (two) times daily. 03/03/20   [provider]  methylPREDNISolone (MEDROL DOSEPAK) 4 MG TBPK tablet Per package instructions Patient not taking: Reported on 02/04/2020 09/21/19   Couture, Cortni S, PA-C  oxyCODONE-acetaminophen (PERCOCET/ROXICET) 5-325 MG tablet Take 1-2 tablets by mouth every 6 (six) hours as needed for severe pain. 03/11/20   Charlann Lange, PA-C  predniSONE (DELTASONE) 10 MG tablet Take 4 tablets (40 mg total) by mouth daily for 3 days. 03/16/20 03/19/20  Alfredia Client, PA-C  senna-docusate (SENOKOT-S) 8.6-50 MG tablet Take 1 tablet by mouth at bedtime as needed for mild constipation. Patient not taking: Reported on 03/11/2020 09/17/16   Nita Sells, MD    Allergies    Patient has no known allergies.  Review of Systems   Review of Systems  Constitutional: Negative for diaphoresis, fatigue and fever.  Eyes: Negative for visual disturbance.  Respiratory: Negative for shortness of breath.   Cardiovascular: Negative for chest pain.  Gastrointestinal: Negative for nausea and vomiting.  Musculoskeletal: Positive for arthralgias. Negative for back pain, joint swelling and myalgias.  Skin: Negative for color change, pallor, rash and wound.  Neurological: Negative for syncope, weakness, light-headedness, numbness and headaches.  Psychiatric/Behavioral: Negative for behavioral problems and confusion.    Physical Exam Updated Vital Signs BP (!) 142/99 (BP Location: Left Arm)   Pulse 98   Temp 97.7 F (36.5 C) (Oral)   Resp 20   SpO2 98%   Physical Exam Constitutional:      General: He is not in acute distress.    Appearance: Normal appearance. He is not ill-appearing, toxic-appearing or diaphoretic.   Cardiovascular:     Rate and Rhythm: Normal rate and regular rhythm.     Pulses: Normal pulses.  Pulmonary:     Effort: Pulmonary effort is normal.     Breath sounds: Normal breath sounds.  Musculoskeletal:        General: Swelling and tenderness present. Normal range of motion.     Comments: Left hand with swelling and tenderness and warmth over the wrist and fingers.  No overlying signs of cellulitis or infection.  Cap refill less than 2 seconds.  Radial pulse 2+.  Normal range of motion with normal strength and sensation to left hand and all finger joints.  Strength is slightly limited due to pain.  Right hand without any abnormalities despite previous deficit from previous stroke.  Bilateral knees without any swelling or  edema.  Left knee with tenderness to palpation over knee.  Normal strength normal sensation bilaterally.  Normal laxity tests.PT pulses 2+.  Skin:    General: Skin is warm and dry.     Capillary Refill: Capillary refill takes less than 2 seconds.  Neurological:     General: No focal deficit present.     Mental Status: He is alert and oriented to person, place, and time.  Psychiatric:        Mood and Affect: Mood normal.        Behavior: Behavior normal.        Thought Content: Thought content normal.     ED Results / Procedures / Treatments   Labs (all labs ordered are listed, but only abnormal results are displayed) Labs Reviewed - No data to display  EKG None  Radiology No results found.  Procedures Procedures (including critical care time)  Medications Ordered in ED Medications  predniSONE (DELTASONE) tablet 40 mg (has no administration in time range)    ED Course  I have reviewed the triage vital signs and the nursing notes.  Pertinent labs & imaging results that were available during my care of the patient were reviewed by me and considered in my medical decision making (see chart for details).    MDM Rules/Calculators/A&P                          XZAYVION VAETH is a 49 y.o. male with pertinent past medical history of gout, hypertension that presents the emergency department today for evaluation of his gout. Do not think that imaging or lab testing is needed at this time, pt states that this feels exactly like gout flare. Labs were done  7/16. PE suggestive of gout, no evidence of septic joint. No fevers. Already has prescription of indomethacin and colchicine. New flare two days ago from pork. Pt to see PCP on Friday, but could not wait for steroids. Will give pt steroid burst. Pt is not immunosuppressed and is not a diabetic.   Doubt need for further emergent work up at this time. I explained the diagnosis and have given explicit precautions to return to the ER including for any other new or worsening symptoms. The patient understands and accepts the medical plan as it's been dictated and I have answered their questions. Discharge instructions concerning home care and prescriptions have been given. The patient is STABLE and is discharged to home in good condition.   Final Clinical Impression(s) / ED Diagnoses Final diagnoses:  Acute gout of left wrist, unspecified cause    Rx / DC Orders ED Discharge Orders         Ordered    predniSONE (DELTASONE) 10 MG tablet  Daily     Discontinue  Reprint     03/16/20 2105              Alfredia Client, PA-C 03/17/20 1126    Quintella Reichert, MD 03/17/20 1147

## 2020-03-16 NOTE — Discharge Instructions (Addendum)
You were seen today for gout.  I want you to take the prednisone as prescribed.  Follow-up with your primary care on Friday.  If you have any new or worsening concerning symptoms such as overlying redness, fevers please come back to the emergency department.  Use the attached instructions.  Continue to take your medications as prescribed.  I hope you feel better!

## 2020-03-16 NOTE — ED Notes (Signed)
Pt stated that he was paralyzed and needed a wheelchair when called from the lobby. Pt was able to ambulate from from wheelchair to lobby chair earlier today. Pt ambulatory with 2 person assist from lobby to room.

## 2020-03-16 NOTE — ED Triage Notes (Signed)
Pt reports a gout flare up in left hand and arm. Pt reports recently being at [facility] where they prescribed him hydrocodone and prednisone. Pt reports the pain has gotten worse since then.

## 2020-03-18 DIAGNOSIS — G629 Polyneuropathy, unspecified: Secondary | ICD-10-CM | POA: Diagnosis not present

## 2020-03-18 DIAGNOSIS — R600 Localized edema: Secondary | ICD-10-CM | POA: Diagnosis not present

## 2020-03-18 DIAGNOSIS — E7800 Pure hypercholesterolemia, unspecified: Secondary | ICD-10-CM | POA: Diagnosis not present

## 2020-03-18 DIAGNOSIS — R7989 Other specified abnormal findings of blood chemistry: Secondary | ICD-10-CM | POA: Diagnosis not present

## 2020-03-18 DIAGNOSIS — I10 Essential (primary) hypertension: Secondary | ICD-10-CM | POA: Diagnosis not present

## 2020-03-22 DIAGNOSIS — R600 Localized edema: Secondary | ICD-10-CM | POA: Diagnosis not present

## 2020-03-22 DIAGNOSIS — G629 Polyneuropathy, unspecified: Secondary | ICD-10-CM | POA: Diagnosis not present

## 2020-03-22 DIAGNOSIS — R7989 Other specified abnormal findings of blood chemistry: Secondary | ICD-10-CM | POA: Diagnosis not present

## 2020-03-22 DIAGNOSIS — E7800 Pure hypercholesterolemia, unspecified: Secondary | ICD-10-CM | POA: Diagnosis not present

## 2020-03-22 DIAGNOSIS — I10 Essential (primary) hypertension: Secondary | ICD-10-CM | POA: Diagnosis not present

## 2020-04-04 ENCOUNTER — Telehealth: Payer: Self-pay | Admitting: *Deleted

## 2020-04-04 NOTE — Telephone Encounter (Signed)
Randall Mccall called and says that he received a letter from [facility] telling him that he needs a wheel knob and foot accelerator.

## 2020-04-05 NOTE — Telephone Encounter (Signed)
This is not something that gets covered by insurance he will need to buy this on his own

## 2020-04-06 ENCOUNTER — Other Ambulatory Visit: Payer: Self-pay

## 2020-04-06 ENCOUNTER — Encounter (HOSPITAL_COMMUNITY): Payer: Self-pay | Admitting: *Deleted

## 2020-04-06 ENCOUNTER — Emergency Department (HOSPITAL_COMMUNITY): Payer: Medicare Other

## 2020-04-06 ENCOUNTER — Emergency Department (HOSPITAL_COMMUNITY)
Admission: EM | Admit: 2020-04-06 | Discharge: 2020-04-06 | Disposition: A | Discharge status: Home / Self Care | Payer: Medicare Other | Attending: Emergency Medicine | Admitting: Emergency Medicine

## 2020-04-06 DIAGNOSIS — N189 Chronic kidney disease, unspecified: Secondary | ICD-10-CM | POA: Diagnosis not present

## 2020-04-06 DIAGNOSIS — Z7982 Long term (current) use of aspirin: Secondary | ICD-10-CM | POA: Insufficient documentation

## 2020-04-06 DIAGNOSIS — M10042 Idiopathic gout, left hand: Secondary | ICD-10-CM | POA: Diagnosis not present

## 2020-04-06 DIAGNOSIS — I129 Hypertensive chronic kidney disease with stage 1 through stage 4 chronic kidney disease, or unspecified chronic kidney disease: Secondary | ICD-10-CM | POA: Diagnosis not present

## 2020-04-06 DIAGNOSIS — Z79899 Other long term (current) drug therapy: Secondary | ICD-10-CM | POA: Diagnosis not present

## 2020-04-06 DIAGNOSIS — M109 Gout, unspecified: Secondary | ICD-10-CM

## 2020-04-06 DIAGNOSIS — M79642 Pain in left hand: Secondary | ICD-10-CM | POA: Diagnosis not present

## 2020-04-06 MED ORDER — PREDNISONE 20 MG PO TABS
40.0000 mg | ORAL_TABLET | Freq: Once | ORAL | Status: AC
  Administered 2020-04-06: 40 mg via ORAL
  Filled 2020-04-06: qty 2

## 2020-04-06 MED ORDER — FAMOTIDINE 20 MG PO TABS
20.0000 mg | ORAL_TABLET | Freq: Two times a day (BID) | ORAL | 0 refills | Status: DC
Start: 2020-04-06 — End: 2020-12-01

## 2020-04-06 MED ORDER — OXYCODONE-ACETAMINOPHEN 5-325 MG PO TABS
1.0000 | ORAL_TABLET | Freq: Once | ORAL | Status: AC
  Administered 2020-04-06: 1 via ORAL
  Filled 2020-04-06: qty 1

## 2020-04-06 MED ORDER — PREDNISONE 10 MG PO TABS
40.0000 mg | ORAL_TABLET | Freq: Every day | ORAL | 0 refills | Status: DC
Start: 2020-04-06 — End: 2020-04-16

## 2020-04-06 NOTE — Telephone Encounter (Signed)
I notified Mr Harbaugh. He says he has a form that has to be signed off by Dr Letta Pate, so I advised him to drop that by the office.

## 2020-04-06 NOTE — ED Provider Notes (Signed)
[facility] EMERGENCY DEPARTMENT Provider Note   CSN: [id number] Arrival date & time: 04/06/20  1204     History Chief Complaint  Patient presents with  . Hand Pain    Randall Mccall is a 49 y.o. male.  The history is provided by the patient and medical records. No language interpreter was used.  Hand Pain   Randall Mccall is a 49 y.o. male who presents to the Emergency Department complaining of hand pain. He presents the emergency department complaining of left hand pain that began last night. Yesterday earlier in the day he accidentally touched the electric fence and later fell but did not directly hit his hand. Late last night he developed pain and swelling in his left hand that is similar to prior gout flare's. He has a history of gout, CVA with right upper extremity hemiparesis. About one week ago his gout medications were changed. He was started on Uloric and his colchicine and allopurinol were discontinued. He took indomethacin twice today with no significant improvement in his symptoms. He did have an episode of diaphoresis while in the waiting room. He denies any fevers, chest pain, shortness of breath, nausea, vomiting. Symptoms are severe and constant nature. He is left-hand dominant due to history of CVA affecting his right upper extremity.    Past Medical History:  Diagnosis Date  . Gout   . Hypertension   . Muscle weakness   . Stroke [facility])     Patient Active Problem List   Diagnosis Date Noted  . Acquired right foot drop 12/22/2019  . CKD (chronic kidney disease) 12/18/2016  . Benign essential HTN   . Idiopathic gout   . Diastolic dysfunction   . Cocaine abuse ([place])   . Spastic hemiparesis of right dominant side ([place])   . 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 Cerebrovascular accident (CVA) ([place])   . Acute ischemic stroke ([place]) 09/15/2016  . Stroke (cerebrum) ([place]) 09/15/2016  .  Hypertension 09/15/2016  . Vitamin B12 deficiency 08/10/2015  . Weakness 07/02/2013  . Abnormal CPK 07/02/2013    Past Surgical History:  Procedure Laterality Date  . KNEE SURGERY         Family History  Problem Relation Age of Onset  . Hypertension Mother   . Cirrhosis Father     Social History   Tobacco Use  . Smoking status: Never Smoker  . Smokeless tobacco: Never Used  Vaping Use  . Vaping Use: Never used  Substance Use Topics  . Alcohol use: Yes    Alcohol/week: 1.0 standard drink    Types: 1 Cans of beer per week    Comment: occ  . Drug use: No    Home Medications Prior to Admission medications   Medication Sig Start Date End Date Taking? Authorizing Provider  allopurinol (ZYLOPRIM) 100 MG tablet Take 1 tablet (100 mg total) by mouth daily. 10/16/17   Daleen Bo, MD  amLODipine (NORVASC) 10 MG tablet Take 1 tablet (10 mg total) by mouth daily. 06/15/16   Elnora Morrison, MD  aspirin 325 MG tablet Take 1 tablet (325 mg total) by mouth daily. 09/18/16   Nita Sells, MD  atorvastatin (LIPITOR) 80 MG tablet Take 1 tablet (80 mg total) by mouth daily at 6 PM. 09/17/16   Nita Sells, MD  Cholecalciferol (VITAMIN D3) 5000 units TABS Take 1 tablet by mouth daily. 09/25/17   [provider]  cloNIDine (CATAPRES)  0.1 MG tablet Take 1 tablet (0.1 mg total) by mouth 2 (two) times daily. 06/15/16   Elnora Morrison, MD  colchicine 0.6 MG tablet Take 1 tablet (0.6 mg total) by mouth every hour as needed. Up to 4 in one 24-hour period, if needed for pain 10/16/17   Daleen Bo, MD  ezetimibe (ZETIA) 10 MG tablet Take 10 mg by mouth daily. 01/24/20   [provider]  famotidine (PEPCID) 20 MG tablet Take 1 tablet (20 mg total) by mouth 2 (two) times daily. 04/06/20   Quintella Reichert, MD  furosemide (LASIX) 20 MG tablet Take 20-40 mg by mouth daily. 02/08/20   [provider]  gabapentin (NEURONTIN) 300 MG capsule Take 300 mg by mouth at  bedtime. 10/09/17   [provider]  HYDROcodone-acetaminophen (NORCO/VICODIN) 5-325 MG tablet Take 1 tablet by mouth every 6 (six) hours as needed. Patient not taking: Reported on 02/04/2020 09/21/19   Couture, Cortni S, PA-C  indomethacin (INDOCIN) 50 MG capsule Take 50 mg by mouth 2 (two) times daily. 03/03/20   [provider]  methylPREDNISolone (MEDROL DOSEPAK) 4 MG TBPK tablet Per package instructions Patient not taking: Reported on 02/04/2020 09/21/19   Couture, Cortni S, PA-C  oxyCODONE-acetaminophen (PERCOCET/ROXICET) 5-325 MG tablet Take 1-2 tablets by mouth every 6 (six) hours as needed for severe pain. 03/11/20   Charlann Lange, PA-C  predniSONE (DELTASONE) 10 MG tablet Take 4 tablets (40 mg total) by mouth daily. 04/06/20   Quintella Reichert, MD  senna-docusate (SENOKOT-S) 8.6-50 MG tablet Take 1 tablet by mouth at bedtime as needed for mild constipation. Patient not taking: Reported on 03/11/2020 09/17/16   Nita Sells, MD    Allergies    Patient has no known allergies.  Review of Systems   Review of Systems  All other systems reviewed and are negative.   Physical Exam Updated Vital Signs BP (!) 123/95   Pulse 98   Temp 98 F (36.7 C) (Oral)   Resp 18   SpO2 97%   Physical Exam Vitals and nursing note reviewed.  Constitutional:      Appearance: He is well-developed.  HENT:     Head: Normocephalic and atraumatic.  Cardiovascular:     Rate and Rhythm: Normal rate and regular rhythm.  Pulmonary:     Effort: Pulmonary effort is normal. No respiratory distress.  Musculoskeletal:     Comments: 2+ radial pulses bilaterally.  There is moderate edema to the left hand, predominantly through the 1st through 3rd phalanxes.  There is mild local tenderness to palpation.  No significant pain or swelling to the left wrist.  There is decreased ROM in the digits secondary to edema.    Skin:    General: Skin is warm and dry.  Neurological:     Mental Status: He  is alert and oriented to person, place, and time.  Psychiatric:        Behavior: Behavior normal.     ED Results / Procedures / Treatments   Labs (all labs ordered are listed, but only abnormal results are displayed) Labs Reviewed - No data to display  EKG None  Radiology DG Hand Complete Left  Result Date: 04/06/2020 CLINICAL DATA:  All over left hand pain and swelling x2 days. Pt states he was shocked in the left hand by an electric fence yesterday and since then, his hand has become swollen and painful. Pt has hx of gout, but denies previous injuries to the left hand. EXAM: LEFT HAND -  COMPLETE 3+ VIEW COMPARISON:  None. FINDINGS: There is no evidence of fracture or dislocation. There are diffuse degenerative changes most pronounced in the thumb and fifth PIP joints as well as throughout the wrist. Regional soft tissues are unremarkable. IMPRESSION: No acute osseous abnormality in the left hand. Electronically Signed   By: Audie Pinto M.D.   On: 04/06/2020 13:27    Procedures Procedures (including critical care time)  Medications Ordered in ED Medications  predniSONE (DELTASONE) tablet 40 mg (has no administration in time range)  oxyCODONE-acetaminophen (PERCOCET/ROXICET) 5-325 MG per tablet 1 tablet (has no administration in time range)    ED Course  I have reviewed the triage vital signs and the nursing notes.  Pertinent labs & imaging results that were available during my care of the patient were reviewed by me and considered in my medical decision making (see chart for details).    MDM Rules/Calculators/A&P                         Pt with hx/o gout, recent minor trauma here for evaluation of pain and swelling in left hand. Exam c/w gout flare.  Doubt septic arthritis, tendon injury, fracture.  He has had recent medication changes but responded well to steroids in the past.  Will treat with prednisone burst, PCP follow up and return precautions.   Final Clinical  Impression(s) / ED Diagnoses Final diagnoses:  Acute gout of left hand, unspecified cause    Rx / DC Orders ED Discharge Orders         Ordered    predniSONE (DELTASONE) 10 MG tablet  Daily     Discontinue  Reprint     04/06/20 2158    famotidine (PEPCID) 20 MG tablet  2 times daily     Discontinue  Reprint     04/06/20 2158           Quintella Reichert, MD 04/06/20 2204

## 2020-04-06 NOTE — ED Triage Notes (Signed)
To ED for eval of left hand pain and swelling. States he touched his electric fence yesterday and later in the day his hand started to swell and have pain. Hx of gout and states this feels the same. Fence was low voltage and he only had hold of the fence for a few seconds. Urinating within normal limits today.

## 2020-04-15 ENCOUNTER — Other Ambulatory Visit: Payer: Self-pay

## 2020-04-15 DIAGNOSIS — N189 Chronic kidney disease, unspecified: Secondary | ICD-10-CM | POA: Insufficient documentation

## 2020-04-15 DIAGNOSIS — I129 Hypertensive chronic kidney disease with stage 1 through stage 4 chronic kidney disease, or unspecified chronic kidney disease: Secondary | ICD-10-CM | POA: Insufficient documentation

## 2020-04-15 DIAGNOSIS — M109 Gout, unspecified: Secondary | ICD-10-CM | POA: Diagnosis not present

## 2020-04-15 DIAGNOSIS — Z79899 Other long term (current) drug therapy: Secondary | ICD-10-CM | POA: Diagnosis not present

## 2020-04-15 DIAGNOSIS — M7989 Other specified soft tissue disorders: Secondary | ICD-10-CM | POA: Insufficient documentation

## 2020-04-15 DIAGNOSIS — F1410 Cocaine abuse, uncomplicated: Secondary | ICD-10-CM | POA: Insufficient documentation

## 2020-04-15 DIAGNOSIS — M25532 Pain in left wrist: Secondary | ICD-10-CM | POA: Diagnosis present

## 2020-04-15 MED ORDER — OXYCODONE-ACETAMINOPHEN 5-325 MG PO TABS
1.0000 | ORAL_TABLET | Freq: Once | ORAL | Status: AC
  Administered 2020-04-15: 1 via ORAL
  Filled 2020-04-15: qty 1

## 2020-04-15 MED ORDER — PREDNISONE 20 MG PO TABS
60.0000 mg | ORAL_TABLET | Freq: Once | ORAL | Status: AC
  Administered 2020-04-15: 60 mg via ORAL
  Filled 2020-04-15: qty 3

## 2020-04-15 NOTE — ED Triage Notes (Signed)
Emergency Medicine Provider Triage Evaluation Note  Randall Mccall , a 49 y.o. male  was evaluated in triage.  Pt complains of gout flare.  Woke up this morning and swelling in left wrist and hand, assumes something bit him but didn't actually see or feel anything sting him because he was asleep.  History of gout in this hand with similar symptoms.  Review of Systems  Positive: Hand pain  Negative: Fever   Physical Exam  There were no vitals taken for this visit. Gen:   Awake, no distress   HEENT:  Atraumatic  Resp:  Normal effort Cardiac:  Normal rate  MSK:   Left hand diffusely swollen, no erythema, warmth, no obvious insect bite, decreased ROM due to pain Neuro:  Speech clear   Medical Decision Making  Medically screening exam initiated at 4:40 PM.  Appropriate orders placed.  Henrietta Dine was informed that the remainder of the evaluation will be completed by another provider, this initial triage assessment does not replace that evaluation, and the importance of remaining in the ED until their evaluation is complete.  Clinical Impression   Soft tissue injury of left hand likely gout.  Seen 3 times in the last month for same. Recent x-ray earlier this month of left hand.  Percocet prednisone ordered.     Kinnie Feil, PA-C 04/15/20 1646

## 2020-04-15 NOTE — ED Triage Notes (Addendum)
Patient states he was bitten last night and woke up with gout flare to left wrist, arm. Doesn't know what bit him. Pain is 200/10 per patient.

## 2020-04-16 ENCOUNTER — Emergency Department (HOSPITAL_COMMUNITY)
Admission: EM | Admit: 2020-04-16 | Discharge: 2020-04-16 | Disposition: A | Discharge status: Home / Self Care | Payer: Medicare Other | Attending: Emergency Medicine | Admitting: Emergency Medicine

## 2020-04-16 DIAGNOSIS — M109 Gout, unspecified: Secondary | ICD-10-CM | POA: Diagnosis not present

## 2020-04-16 MED ORDER — INDOMETHACIN 50 MG PO CAPS: 50.0000 mg | ORAL_CAPSULE | Freq: Two times a day (BID) | ORAL | 0 refills | Status: DC

## 2020-04-16 MED ORDER — OXYCODONE-ACETAMINOPHEN 5-325 MG PO TABS
1.0000 | ORAL_TABLET | Freq: Once | ORAL | Status: AC
  Administered 2020-04-16: 1 via ORAL
  Filled 2020-04-16: qty 1

## 2020-04-16 MED ORDER — OXYCODONE-ACETAMINOPHEN 5-325 MG PO TABS: 1.0000 | ORAL_TABLET | ORAL | 0 refills | Status: DC | PRN

## 2020-04-16 NOTE — ED Provider Notes (Signed)
[place] DEPT Provider Note   CSN: [id number] Arrival date & time: 04/15/20  1558     History Chief Complaint  Patient presents with  . Gout    left hand    Randall Mccall is a 48 y.o. male.  Patient to ED with pain in his left wrist and hand with associated swelling x 2 days. History of gout with similar location and pain/swelling. He woke suddenly 2 night ago with pain and feels like he must have been bitten by something to wake him up like that. He did not see an insect/spider. No fever.   The history is provided by the patient. No language interpreter was used.       Past Medical History:  Diagnosis Date  . Gout   . Hypertension   . Muscle weakness   . Stroke [facility])     Patient Active Problem List   Diagnosis Date Noted  . Acquired right foot drop 12/22/2019  . CKD (chronic kidney disease) 12/18/2016  . Benign essential HTN   . Idiopathic gout   . Diastolic dysfunction   . Cocaine abuse ([place])   . Spastic hemiparesis of right dominant side ([place])   . 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 Cerebrovascular accident (CVA) ([place])   . Acute ischemic stroke ([place]) 09/15/2016  . Stroke (cerebrum) ([place]) 09/15/2016  . Hypertension 09/15/2016  . Vitamin B12 deficiency 08/10/2015  . Weakness 07/02/2013  . Abnormal CPK 07/02/2013    Past Surgical History:  Procedure Laterality Date  . KNEE SURGERY         Family History  Problem Relation Age of Onset  . Hypertension Mother   . Cirrhosis Father     Social History   Tobacco Use  . Smoking status: Never Smoker  . Smokeless tobacco: Never Used  Vaping Use  . Vaping Use: Never used  Substance Use Topics  . Alcohol use: Yes    Alcohol/week: 1.0 standard drink    Types: 1 Cans of beer per week    Comment: occ  . Drug use: No    Home Medications Prior to Admission medications   Medication Sig Start Date End Date  Taking? Authorizing Provider  amLODipine (NORVASC) 10 MG tablet Take 1 tablet (10 mg total) by mouth daily. 06/15/16  Yes Elnora Morrison, MD  aspirin 325 MG tablet Take 1 tablet (325 mg total) by mouth daily. 09/18/16  Yes Nita Sells, MD  atorvastatin (LIPITOR) 80 MG tablet Take 1 tablet (80 mg total) by mouth daily at 6 PM. 09/17/16  Yes Nita Sells, MD  Cholecalciferol (VITAMIN D3) 5000 units TABS Take 1 tablet by mouth daily. 09/25/17  Yes [provider]  cloNIDine (CATAPRES) 0.1 MG tablet Take 1 tablet (0.1 mg total) by mouth 2 (two) times daily. 06/15/16  Yes Elnora Morrison, MD  ezetimibe (ZETIA) 10 MG tablet Take 10 mg by mouth daily. 01/24/20  Yes [provider]  famotidine (PEPCID) 20 MG tablet Take 1 tablet (20 mg total) by mouth 2 (two) times daily. 04/06/20  Yes Quintella Reichert, MD  febuxostat (ULORIC) 40 MG tablet Take 40 mg by mouth daily. 03/22/20  Yes [provider]  furosemide (LASIX) 20 MG tablet Take 20-40 mg by mouth daily. 02/08/20  Yes [provider]  gabapentin (NEURONTIN) 300 MG capsule Take 300 mg by mouth at bedtime. 10/09/17  Yes [provider]  indomethacin (INDOCIN) 50 MG capsule  Take 50 mg by mouth 2 (two) times daily. 03/03/20  Yes [provider]    Allergies    Patient has no known allergies.  Review of Systems   Review of Systems  Constitutional: Negative for fever.  Musculoskeletal:       See HPI.  Skin: Negative for color change.  Neurological: Negative for numbness.    Physical Exam Updated Vital Signs BP (!) 165/108 (BP Location: Right Arm)   Pulse (!) 103   Temp 98.3 F (36.8 C) (Oral)   Resp 18   Ht 6\' 1"  (1.854 m)   Wt 113.4 kg   SpO2 98%   BMI 32.98 kg/m   Physical Exam Vitals and nursing note reviewed.  Constitutional:      Appearance: Normal appearance. He is obese.  Pulmonary:     Effort: Pulmonary effort is normal.  Musculoskeletal:     Comments: Left hand  mildly swollen in generalized distribution. No redness or warmth. Tender most over dorsal wrist. FROM all joints of left hand.   Skin:    General: Skin is warm.     Findings: No erythema.  Neurological:     Mental Status: He is alert.     Sensory: No sensory deficit.     ED Results / Procedures / Treatments   Labs (all labs ordered are listed, but only abnormal results are displayed) Labs Reviewed - No data to display  EKG None  Radiology No results found.  Procedures Procedures (including critical care time)  Medications Ordered in ED Medications  oxyCODONE-acetaminophen (PERCOCET/ROXICET) 5-325 MG per tablet 1 tablet (1 tablet Oral Given 04/15/20 1651)  predniSONE (DELTASONE) tablet 60 mg (60 mg Oral Given 04/15/20 1651)    ED Course  I have reviewed the triage vital signs and the nursing notes.  Pertinent labs & imaging results that were available during my care of the patient were reviewed by me and considered in my medical decision making (see chart for details).    MDM Rules/Calculators/A&P                          Patient with history of gout presents with similar symptoms affecting left wrist.   He reports he is out of his "gout" medication and tried to obtain this yesterday from his primary care MD but was unable. His evaluation by provider tonight was delayed due to census. He was given prednisone and Percocet on arrival which he states helped his pain.   Do not feel there is infection present. No direct injury, no deformity. VSS. H/o gout with similar location and character of pain. Will refill Indomethacin and provide pain relief.  Final Clinical Impression(s) / ED Diagnoses Final diagnoses:  None   1. Gout  Rx / DC Orders ED Discharge Orders    None       Charlann Lange, Hershal Coria 04/16/20 0148    Veryl Speak, MD 04/16/20 [phone]

## 2020-04-29 DIAGNOSIS — G629 Polyneuropathy, unspecified: Secondary | ICD-10-CM | POA: Diagnosis not present

## 2020-04-29 DIAGNOSIS — Z131 Encounter for screening for diabetes mellitus: Secondary | ICD-10-CM | POA: Diagnosis not present

## 2020-04-29 DIAGNOSIS — E7800 Pure hypercholesterolemia, unspecified: Secondary | ICD-10-CM | POA: Diagnosis not present

## 2020-04-29 DIAGNOSIS — R7989 Other specified abnormal findings of blood chemistry: Secondary | ICD-10-CM | POA: Diagnosis not present

## 2020-04-29 DIAGNOSIS — I10 Essential (primary) hypertension: Secondary | ICD-10-CM | POA: Diagnosis not present

## 2020-04-29 DIAGNOSIS — R600 Localized edema: Secondary | ICD-10-CM | POA: Diagnosis not present

## 2020-05-06 ENCOUNTER — Other Ambulatory Visit: Payer: Self-pay

## 2020-05-06 ENCOUNTER — Encounter: Payer: Medicare Other | Attending: Physical Medicine & Rehabilitation | Admitting: Physical Medicine & Rehabilitation

## 2020-05-06 ENCOUNTER — Encounter: Payer: Self-pay | Admitting: Physical Medicine & Rehabilitation

## 2020-05-06 VITALS — HR 92 | Temp 98.0°F | Ht 73.0 in | Wt 254.0 lb

## 2020-05-06 DIAGNOSIS — G8111 Spastic hemiplegia affecting right dominant side: Secondary | ICD-10-CM | POA: Insufficient documentation

## 2020-05-06 NOTE — Progress Notes (Signed)
Botox Injection for spasticity using needle EMG guidance  Dilution: 50 Units/ml Indication: Severe spasticity which interferes with ADL,mobility and/or  hygiene and is unresponsive to medication management and other conservative care Informed consent was obtained after describing risks and benefits of the procedure with the patient. This includes bleeding, bruising, infection, excessive weakness, or medication side effects. A REMS form is on file and signed. Needle: 25g 2" needle electrode-  Number of units per muscle Botox FCR50 FCU50 FDS50 FDP50  PQ 50 PT 50 All injections were done after obtaining appropriate EMG activity and after negative drawback for blood. The patient tolerated the procedure well. Post procedure instructions were given. A followup appointment was made.       

## 2020-05-06 NOTE — Patient Instructions (Signed)

## 2020-05-09 DIAGNOSIS — G629 Polyneuropathy, unspecified: Secondary | ICD-10-CM | POA: Diagnosis not present

## 2020-05-09 DIAGNOSIS — R7989 Other specified abnormal findings of blood chemistry: Secondary | ICD-10-CM | POA: Diagnosis not present

## 2020-05-09 DIAGNOSIS — E7800 Pure hypercholesterolemia, unspecified: Secondary | ICD-10-CM | POA: Diagnosis not present

## 2020-05-09 DIAGNOSIS — I10 Essential (primary) hypertension: Secondary | ICD-10-CM | POA: Diagnosis not present

## 2020-05-09 DIAGNOSIS — E119 Type 2 diabetes mellitus without complications: Secondary | ICD-10-CM | POA: Diagnosis not present

## 2020-05-17 DIAGNOSIS — Z8673 Personal history of transient ischemic attack (TIA), and cerebral infarction without residual deficits: Secondary | ICD-10-CM | POA: Diagnosis not present

## 2020-05-17 DIAGNOSIS — I10 Essential (primary) hypertension: Secondary | ICD-10-CM | POA: Diagnosis not present

## 2020-05-17 DIAGNOSIS — G629 Polyneuropathy, unspecified: Secondary | ICD-10-CM | POA: Diagnosis not present

## 2020-05-17 DIAGNOSIS — E1165 Type 2 diabetes mellitus with hyperglycemia: Secondary | ICD-10-CM | POA: Diagnosis not present

## 2020-05-17 DIAGNOSIS — E7800 Pure hypercholesterolemia, unspecified: Secondary | ICD-10-CM | POA: Diagnosis not present

## 2020-05-26 ENCOUNTER — Emergency Department (HOSPITAL_COMMUNITY): Payer: Medicare Other

## 2020-05-26 ENCOUNTER — Emergency Department (HOSPITAL_COMMUNITY)
Admission: EM | Admit: 2020-05-26 | Discharge: 2020-05-26 | Disposition: A | Discharge status: Home / Self Care | Payer: Medicare Other | Attending: Emergency Medicine | Admitting: Emergency Medicine

## 2020-05-26 ENCOUNTER — Encounter (HOSPITAL_COMMUNITY): Payer: Self-pay

## 2020-05-26 ENCOUNTER — Other Ambulatory Visit: Payer: Self-pay

## 2020-05-26 DIAGNOSIS — Z7982 Long term (current) use of aspirin: Secondary | ICD-10-CM | POA: Diagnosis not present

## 2020-05-26 DIAGNOSIS — R2232 Localized swelling, mass and lump, left upper limb: Secondary | ICD-10-CM | POA: Diagnosis present

## 2020-05-26 DIAGNOSIS — I10 Essential (primary) hypertension: Secondary | ICD-10-CM | POA: Diagnosis not present

## 2020-05-26 DIAGNOSIS — M19042 Primary osteoarthritis, left hand: Secondary | ICD-10-CM | POA: Diagnosis not present

## 2020-05-26 DIAGNOSIS — G8929 Other chronic pain: Secondary | ICD-10-CM | POA: Insufficient documentation

## 2020-05-26 DIAGNOSIS — M25532 Pain in left wrist: Secondary | ICD-10-CM | POA: Diagnosis not present

## 2020-05-26 DIAGNOSIS — M7989 Other specified soft tissue disorders: Secondary | ICD-10-CM | POA: Diagnosis not present

## 2020-05-26 MED ORDER — PREDNISONE 10 MG (21) PO TBPK
ORAL_TABLET | ORAL | 0 refills | Status: DC
Start: 2020-05-26 — End: 2020-08-05

## 2020-05-26 MED ORDER — COLCHICINE 0.6 MG PO TABS
ORAL_TABLET | ORAL | 0 refills | Status: DC
Start: 2020-05-26 — End: 2021-01-15

## 2020-05-26 NOTE — ED Notes (Signed)
An After Visit Summary was printed and given to the patient. Discharge instructions given and no further questions at this time.  

## 2020-05-26 NOTE — Progress Notes (Signed)
Orthopedic Tech Progress Note Patient Details:  [name] 1971-08-23 [id number]  Ortho Devices Type of Ortho Device: Wrist splint Ortho Device/Splint Location: Patient Left Ortho Device/Splint Interventions: Ordered, Application, Adjustment   Post Interventions Patient Tolerated: Well Instructions Provided: Adjustment of device, Care of device   Rosana Hoes 05/26/2020, 7:22 PM

## 2020-05-26 NOTE — ED Provider Notes (Signed)
[place] EMERGENCY DEPARTMENT Provider Note  CSN: [id number] Arrival date & time: 05/26/20 1524    History Chief Complaint  Patient presents with  . hand swelling    HPI  Randall Mccall is a 49 y.o. male with history of chronic L wrist pain attributed to gout with frequent ED visits for same, reports something bit him on his L hand last night and made his gout flare up. He denies any trauma or injury. Complaining of moderate to severe aching pain, worse with movement. He has indomethacin at home. Has tried Colchicine in the past with minimal improvement.    Past Medical History:  Diagnosis Date  . Gout   . Hypertension   . Muscle weakness   . Stroke [facility])     Past Surgical History:  Procedure Laterality Date  . KNEE SURGERY      Family History  Problem Relation Age of Onset  . Hypertension Mother   . Cirrhosis Father     Social History   Tobacco Use  . Smoking status: Never Smoker  . Smokeless tobacco: Never Used  Vaping Use  . Vaping Use: Never used  Substance Use Topics  . Alcohol use: Yes    Alcohol/week: 1.0 standard drink    Types: 1 Cans of beer per week    Comment: occ  . Drug use: No     Home Medications Prior to Admission medications   Medication Sig Start Date End Date Taking? Authorizing Provider  amLODipine (NORVASC) 10 MG tablet Take 1 tablet (10 mg total) by mouth daily. 06/15/16   Elnora Morrison, MD  aspirin 325 MG tablet Take 1 tablet (325 mg total) by mouth daily. 09/18/16   Nita Sells, MD  atorvastatin (LIPITOR) 80 MG tablet Take 1 tablet (80 mg total) by mouth daily at 6 PM. 09/17/16   Nita Sells, MD  Cholecalciferol (VITAMIN D3) 5000 units TABS Take 1 tablet by mouth daily. 09/25/17   [provider]  cloNIDine (CATAPRES) 0.1 MG tablet Take 1 tablet (0.1 mg total) by mouth 2 (two) times daily. 06/15/16   Elnora Morrison, MD  colchicine 0.6 MG tablet Take 2 tablets by mouth and then a third tablet by mouth  one hour later 05/26/20   Truddie Hidden, MD  ezetimibe (ZETIA) 10 MG tablet Take 10 mg by mouth daily. 01/24/20   [provider]  famotidine (PEPCID) 20 MG tablet Take 1 tablet (20 mg total) by mouth 2 (two) times daily. 04/06/20   Quintella Reichert, MD  febuxostat (ULORIC) 40 MG tablet Take 40 mg by mouth daily. 03/22/20   [provider]  furosemide (LASIX) 20 MG tablet Take 20-40 mg by mouth daily. 02/08/20   [provider]  gabapentin (NEURONTIN) 300 MG capsule Take 300 mg by mouth at bedtime. 10/09/17   [provider]  indomethacin (INDOCIN) 50 MG capsule Take 1 capsule (50 mg total) by mouth 2 (two) times daily. 04/16/20   Charlann Lange, PA-C  oxyCODONE-acetaminophen (PERCOCET/ROXICET) 5-325 MG tablet Take 1 tablet by mouth every 4 (four) hours as needed for severe pain. 04/16/20   Charlann Lange, PA-C  predniSONE (STERAPRED UNI-PAK 21 TAB) 10 MG (21) TBPK tablet 10mg  Tabs, 6 day taper. Use as directed 05/26/20   Truddie Hidden, MD     Allergies    Patient has no known allergies.   Review of Systems   Review of Systems A comprehensive review of systems was completed and negative except as noted  in HPI.    Physical Exam BP 138/87 (BP Location: Left Arm)   Pulse 86   Temp 98.1 F (36.7 C) (Oral)   Resp 18   Ht 6\' 1"  (1.854 m)   Wt 115.2 kg   SpO2 97%   BMI 33.51 kg/m   Physical Exam Vitals and nursing note reviewed.  Constitutional:      Appearance: Normal appearance.  HENT:     Head: Normocephalic and atraumatic.     Nose: Nose normal.     Mouth/Throat:     Mouth: Mucous membranes are moist.  Eyes:     Extraocular Movements: Extraocular movements intact.     Conjunctiva/sclera: Conjunctivae normal.  Cardiovascular:     Rate and Rhythm: Normal rate.  Pulmonary:     Effort: Pulmonary effort is normal.     Breath sounds: Normal breath sounds.  Abdominal:     General: Abdomen is flat.     Palpations: Abdomen is soft.      Tenderness: There is no abdominal tenderness.  Musculoskeletal:        General: Normal range of motion.     Cervical back: Neck supple.     Comments: Moderate tenderness to L wrist, mostly on the radial side, minimal erythema or warmth, decreased ROM due to pain.   Skin:    General: Skin is warm and dry.  Neurological:     General: No focal deficit present.     Mental Status: He is alert.  Psychiatric:        Mood and Affect: Mood normal.      ED Results / Procedures / Treatments   Labs (all labs ordered are listed, but only abnormal results are displayed) Labs Reviewed - No data to display  EKG None  Radiology DG Hand Complete Left  Result Date: 05/26/2020 CLINICAL DATA:  Pain and swelling EXAM: LEFT HAND - COMPLETE 3+ VIEW COMPARISON:  April 06, 2020 FINDINGS: Frontal, oblique, and lateral views were obtained. No acute fracture or dislocation evident. There is extensive narrowing throughout the proximal carpal row with bony overgrowth along the distal ulna, stable. There is narrowing of the first MCP and IP joints as well as the fifth PIP joint, stable. No erosive changes or bony destruction evident. IMPRESSION: Stable multifocal osteoarthritic changes. No acute fracture or dislocation. No erosive change evident. Electronically Signed   By: Lowella Grip III M.D.   On: 05/26/2020 17:00    Procedures Procedures  Medications Ordered in the ED Medications - No data to display   MDM Rules/Calculators/A&P MDM Xrays reviewed. Patient does not have a wrist brace at home. Advised that ED Rx for narcotics not indicated today. Will d/c with Rx for prednisone, colchicine and PCP follow up.  ED Course  I have reviewed the triage vital signs and the nursing notes.  Pertinent labs & imaging results that were available during my care of the patient were reviewed by me and considered in my medical decision making (see chart for details).     Final Clinical Impression(s) / ED  Diagnoses Final diagnoses:  Chronic pain of left wrist    Rx / DC Orders ED Discharge Orders         Ordered    predniSONE (STERAPRED UNI-PAK 21 TAB) 10 MG (21) TBPK tablet        05/26/20 1902    colchicine 0.6 MG tablet        05/26/20 1902  Truddie Hidden, MD 05/26/20 Lurline Hare

## 2020-05-26 NOTE — ED Triage Notes (Signed)
patient checked in c/o left hand swelling and stated he got bit by a bug. Patient has swelling and redness to the left hand and fingers. Patient does report a history of gout.

## 2020-07-04 DIAGNOSIS — I69351 Hemiplegia and hemiparesis following cerebral infarction affecting right dominant side: Secondary | ICD-10-CM | POA: Diagnosis not present

## 2020-07-04 DIAGNOSIS — I672 Cerebral atherosclerosis: Secondary | ICD-10-CM | POA: Diagnosis not present

## 2020-07-04 DIAGNOSIS — I10 Essential (primary) hypertension: Secondary | ICD-10-CM | POA: Diagnosis not present

## 2020-07-04 DIAGNOSIS — E785 Hyperlipidemia, unspecified: Secondary | ICD-10-CM | POA: Diagnosis not present

## 2020-07-29 DIAGNOSIS — E1165 Type 2 diabetes mellitus with hyperglycemia: Secondary | ICD-10-CM | POA: Diagnosis not present

## 2020-07-29 DIAGNOSIS — G629 Polyneuropathy, unspecified: Secondary | ICD-10-CM | POA: Diagnosis not present

## 2020-07-29 DIAGNOSIS — E7800 Pure hypercholesterolemia, unspecified: Secondary | ICD-10-CM | POA: Diagnosis not present

## 2020-07-29 DIAGNOSIS — Z23 Encounter for immunization: Secondary | ICD-10-CM | POA: Diagnosis not present

## 2020-07-29 DIAGNOSIS — Z8673 Personal history of transient ischemic attack (TIA), and cerebral infarction without residual deficits: Secondary | ICD-10-CM | POA: Diagnosis not present

## 2020-07-29 DIAGNOSIS — I10 Essential (primary) hypertension: Secondary | ICD-10-CM | POA: Diagnosis not present

## 2020-08-05 ENCOUNTER — Other Ambulatory Visit: Payer: Self-pay

## 2020-08-05 ENCOUNTER — Encounter: Payer: Self-pay | Admitting: Physical Medicine & Rehabilitation

## 2020-08-05 ENCOUNTER — Encounter: Payer: Medicare Other | Attending: Physical Medicine & Rehabilitation | Admitting: Physical Medicine & Rehabilitation

## 2020-08-05 VITALS — BP 111/72 | HR 90 | Temp 98.6°F | Ht 73.0 in | Wt 250.2 lb

## 2020-08-05 DIAGNOSIS — G8111 Spastic hemiplegia affecting right dominant side: Secondary | ICD-10-CM | POA: Insufficient documentation

## 2020-08-05 NOTE — Patient Instructions (Signed)

## 2020-08-05 NOTE — Progress Notes (Signed)
Botox Injection for spasticity using needle EMG guidance  Dilution: 50 Units/ml Indication: Severe spasticity which interferes with ADL,mobility and/or  hygiene and is unresponsive to medication management and other conservative care Informed consent was obtained after describing risks and benefits of the procedure with the patient. This includes bleeding, bruising, infection, excessive weakness, or medication side effects. A REMS form is on file and signed. Needle: 25g 2" needle electrode-  Number of units per muscle Botox FCR50 FCU50 FDS50 FDP50  PQ 50 PT 50 All injections were done after obtaining appropriate EMG activity and after negative drawback for blood. The patient tolerated the procedure well. Post procedure instructions were given. A followup appointment was made.       

## 2020-10-27 DIAGNOSIS — M1A09X0 Idiopathic chronic gout, multiple sites, without tophus (tophi): Secondary | ICD-10-CM | POA: Diagnosis not present

## 2020-10-27 DIAGNOSIS — E7800 Pure hypercholesterolemia, unspecified: Secondary | ICD-10-CM | POA: Diagnosis not present

## 2020-10-27 DIAGNOSIS — R7303 Prediabetes: Secondary | ICD-10-CM | POA: Diagnosis not present

## 2020-10-27 DIAGNOSIS — I10 Essential (primary) hypertension: Secondary | ICD-10-CM | POA: Diagnosis not present

## 2020-10-27 DIAGNOSIS — R600 Localized edema: Secondary | ICD-10-CM | POA: Diagnosis not present

## 2020-10-27 DIAGNOSIS — R21 Rash and other nonspecific skin eruption: Secondary | ICD-10-CM | POA: Diagnosis not present

## 2020-11-03 ENCOUNTER — Encounter: Payer: Medicare Other | Attending: Physical Medicine & Rehabilitation | Admitting: Physical Medicine & Rehabilitation

## 2020-11-03 DIAGNOSIS — G8111 Spastic hemiplegia affecting right dominant side: Secondary | ICD-10-CM | POA: Insufficient documentation

## 2020-11-11 ENCOUNTER — Other Ambulatory Visit: Payer: Self-pay

## 2020-11-11 ENCOUNTER — Encounter (HOSPITAL_BASED_OUTPATIENT_CLINIC_OR_DEPARTMENT_OTHER): Payer: Medicare Other | Admitting: Physical Medicine & Rehabilitation

## 2020-11-11 ENCOUNTER — Encounter: Payer: Self-pay | Admitting: Physical Medicine & Rehabilitation

## 2020-11-11 VITALS — BP 116/70 | HR 87 | Temp 98.8°F | Ht 73.0 in | Wt 246.4 lb

## 2020-11-11 DIAGNOSIS — G8111 Spastic hemiplegia affecting right dominant side: Secondary | ICD-10-CM | POA: Diagnosis not present

## 2020-11-11 NOTE — Patient Instructions (Signed)
Botox next visit  

## 2020-11-11 NOTE — Progress Notes (Signed)
Subjective:    Patient ID: Randall Mccall, male    DOB: Jul 11, 1971, 50 y.o.   MRN: [id number]  HPI 50 year old male with chronic right spastic hemiplegia secondary to left PL IC infarct onset in 2018.  He feels like his botulinum toxin injection wore off and is approximately 3 months postinjection.  He feels like he is getting increasing tightness of the fingers and wrist.  He missed an appointment and is here for follow-up.  Interval medical history unremarkable Botox 08/05/2020 Botox FCR50 FCU50 FDS50 FDP50  PQ 50 PT 50 Pain Inventory Average Pain 0 Pain Right Now 0 My pain is no pain  In the last 24 hours, has pain interfered with the following? General activity 0 Relation with others 0 Enjoyment of life 0 What TIME of day is your pain at its worst? No pain Sleep (in general) NA  Pain is worse with: no pain Pain improves with: no pain Relief from Meds: no pain  Family History  Problem Relation Age of Onset  . Hypertension Mother   . Cirrhosis Father    Social History   Socioeconomic History  . Marital status: Single    Spouse name: Not on file  . Number of children: 0  . Years of education: 54  . Highest education level: Not on file  Occupational History    Comment: Disabled  Tobacco Use  . Smoking status: Never Smoker  . Smokeless tobacco: Never Used  Vaping Use  . Vaping Use: Never used  Substance and Sexual Activity  . Alcohol use: Yes    Alcohol/week: 1.0 standard drink    Types: 1 Cans of beer per week    Comment: occ  . Drug use: No  . Sexual activity: Not on file  Other Topics Concern  . Not on file  Social History Narrative   Patient lives alone and he is single.   Right handed.   Caffeine-    Education- 12 th grade   Disabled.   Social Determinants of Health   Financial Resource Strain: Not on file  Food Insecurity: Not on file  Transportation Needs: Not on file  Physical Activity: Not on file  Stress: Not on file  Social  Connections: Not on file   Past Surgical History:  Procedure Laterality Date  . KNEE SURGERY     Past Surgical History:  Procedure Laterality Date  . KNEE SURGERY     Past Medical History:  Diagnosis Date  . Gout   . Hypertension   . Muscle weakness   . Stroke (HCC)    BP 116/70   Pulse 87   Temp 98.8 F (37.1 C)   Ht 6\' 1"  (1.854 m)   Wt 246 lb 6.4 oz (111.8 kg)   SpO2 96%   BMI 32.51 kg/m   Opioid Risk Score:   Fall Risk Score:  `1  Depression screen PHQ 2/9  Depression screen [facility] 2/9 11/11/2020 08/05/2020 03/09/2019 12/08/2018 10/24/2018 09/26/2018 03/20/2018  Decreased Interest 0 0 0 0 0 0 0  Down, Depressed, Hopeless 0 0 0 - 0 0 0  PHQ - 2 Score 0 0 0 0 0 0 0   Review of Systems  Constitutional: Negative.   HENT: Negative.   Eyes: Negative.   Respiratory: Negative.   Cardiovascular: Negative.   Gastrointestinal: Negative.   Endocrine: Negative.   Genitourinary: Negative.   Musculoskeletal: Positive for gait problem.  Skin: Negative.   Allergic/Immunologic: Negative.   Hematological: Negative.  Psychiatric/Behavioral: Negative.   All other systems reviewed and are negative.      Objective:   Physical Exam Vitals and nursing note reviewed.  Constitutional:      Appearance: He is obese.  Eyes:     Extraocular Movements: Extraocular movements intact.     Conjunctiva/sclera: Conjunctivae normal.     Pupils: Pupils are equal, round, and reactive to light.  Neurological:     Mental Status: He is alert and oriented to person, place, and time.     Cranial Nerves: No dysarthria.     Motor: Weakness and abnormal muscle tone present.     Coordination: Coordination abnormal.     Gait: Gait abnormal.     Comments: Ambulates with shuffling type gait no evidence of toe drag or knee instability Motor strength is 3 - at the right deltoid 4 - bicep tricep and grip 3/5 finger extensors 4/5 in the right hip flexor knee extensor ankle dorsiflexor 5/5 on the left  side Tone MAS 3 at the finger and wrist flexors on the right side.  Psychiatric:        Mood and Affect: Mood normal.           Assessment & Plan:   1.  Right spastic hemi due to left PL IC infarct in 2018.  Botox starting wear off, current dosing regimen is working well and should be repeated  FCR50 FCU50 FDS50 FDP50  PQ 50 PT 50

## 2020-11-30 ENCOUNTER — Emergency Department (HOSPITAL_COMMUNITY)
Admission: EM | Admit: 2020-11-30 | Discharge: 2020-12-01 | Disposition: A | Discharge status: Home / Self Care | Payer: Medicare Other | Attending: Emergency Medicine | Admitting: Emergency Medicine

## 2020-11-30 ENCOUNTER — Other Ambulatory Visit: Payer: Self-pay

## 2020-11-30 DIAGNOSIS — R21 Rash and other nonspecific skin eruption: Secondary | ICD-10-CM | POA: Diagnosis not present

## 2020-11-30 DIAGNOSIS — I129 Hypertensive chronic kidney disease with stage 1 through stage 4 chronic kidney disease, or unspecified chronic kidney disease: Secondary | ICD-10-CM | POA: Diagnosis not present

## 2020-11-30 DIAGNOSIS — N189 Chronic kidney disease, unspecified: Secondary | ICD-10-CM | POA: Diagnosis not present

## 2020-11-30 DIAGNOSIS — Z7982 Long term (current) use of aspirin: Secondary | ICD-10-CM | POA: Diagnosis not present

## 2020-11-30 DIAGNOSIS — Z79899 Other long term (current) drug therapy: Secondary | ICD-10-CM | POA: Diagnosis not present

## 2020-11-30 NOTE — ED Provider Notes (Signed)
Patient placed in Quick Look pathway, seen and evaluated   Chief Complaint: Rash   HPI:   Pt complains of a rash full body.  Pt denies any exposures.   ROS: no fever no chills no sore throat   Physical Exam:   Gen: No distress  Neuro: Awake and Alert  Skin: Warm    Focused Exam: raised rash    Initiation of care has begun. The patient has been counseled on the process, plan, and necessity for staying for the completion/evaluation, and the remainder of the medical screening examination   Sidney Ace 11/30/20 2010    Blanchie Dessert, MD 11/30/20 2304

## 2020-11-30 NOTE — ED Triage Notes (Signed)
Pt presents to ED POv. Pt c/o rash all over. Pt denies known cotact w/ any irritants. Pt reports that rash ithces and burns.

## 2020-12-01 ENCOUNTER — Encounter (HOSPITAL_COMMUNITY): Payer: Self-pay | Admitting: Student

## 2020-12-01 ENCOUNTER — Encounter: Payer: Medicare Other | Attending: Physical Medicine & Rehabilitation | Admitting: Physical Medicine & Rehabilitation

## 2020-12-01 ENCOUNTER — Other Ambulatory Visit: Payer: Self-pay

## 2020-12-01 ENCOUNTER — Encounter: Payer: Self-pay | Admitting: Physical Medicine & Rehabilitation

## 2020-12-01 VITALS — BP 105/65 | HR 78 | Temp 98.4°F | Ht 73.0 in | Wt 246.2 lb

## 2020-12-01 DIAGNOSIS — R21 Rash and other nonspecific skin eruption: Secondary | ICD-10-CM | POA: Diagnosis not present

## 2020-12-01 DIAGNOSIS — G8111 Spastic hemiplegia affecting right dominant side: Secondary | ICD-10-CM

## 2020-12-01 MED ORDER — DIPHENHYDRAMINE HCL 25 MG PO TABS
25.0000 mg | ORAL_TABLET | Freq: Four times a day (QID) | ORAL | 0 refills | Status: DC | PRN
Start: 2020-12-01 — End: 2022-06-14

## 2020-12-01 MED ORDER — FAMOTIDINE 20 MG PO TABS: 20.0000 mg | ORAL_TABLET | Freq: Two times a day (BID) | ORAL | 0 refills | Status: AC | PRN

## 2020-12-01 MED ORDER — PREDNISONE 10 MG PO TABS
40.0000 mg | ORAL_TABLET | Freq: Every day | ORAL | 0 refills | Status: AC
Start: 2020-12-01 — End: 2020-12-06

## 2020-12-01 NOTE — Patient Instructions (Signed)

## 2020-12-01 NOTE — ED Provider Notes (Signed)
[facility] EMERGENCY DEPARTMENT Provider Note   CSN: [id number] Arrival date & time: 11/30/20  1918     History Chief Complaint  Patient presents with  . Rash    Randall Mccall is a 50 y.o. male with a history of hypertension, prior stroke with right-sided hemiparesis, and CKD who presents to the emergency department with complaints of a rash that he developed yesterday.  Patient states he has a rash to his abdomen and extremities that is pruritic, no alleviating or aggravating factors.  No intervention prior to arrival.  No known new exposures.  No new medications.  He denies fever, chills, shortness of breath, facial swelling, difficulty swallowing, vomiting, or syncope.  No known tick exposures.  HPI     Past Medical History:  Diagnosis Date  . Gout   . Hypertension   . Muscle weakness   . Stroke [facility])     Patient Active Problem List   Diagnosis Date Noted  . Acquired right foot drop 12/22/2019  . CKD (chronic kidney disease) 12/18/2016  . Benign essential HTN   . Idiopathic gout   . Diastolic dysfunction   . Cocaine abuse ([place])   . Spastic hemiparesis of right dominant side ([place])   . 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 Cerebrovascular accident (CVA) ([place])   . Acute ischemic stroke ([place]) 09/15/2016  . Stroke (cerebrum) ([place]) 09/15/2016  . Hypertension 09/15/2016  . Vitamin B12 deficiency 08/10/2015  . Weakness 07/02/2013  . Abnormal CPK 07/02/2013    Past Surgical History:  Procedure Laterality Date  . KNEE SURGERY         Family History  Problem Relation Age of Onset  . Hypertension Mother   . Cirrhosis Father     Social History   Tobacco Use  . Smoking status: Never Smoker  . Smokeless tobacco: Never Used  Vaping Use  . Vaping Use: Never used  Substance Use Topics  . Alcohol use: Yes    Alcohol/week: 1.0 standard drink    Types: 1 Cans of beer per week     Comment: occ  . Drug use: No    Home Medications Prior to Admission medications   Medication Sig Start Date End Date Taking? Authorizing Provider  amLODipine (NORVASC) 10 MG tablet Take 1 tablet (10 mg total) by mouth daily. 06/15/16   Elnora Morrison, MD  aspirin 325 MG tablet Take 1 tablet (325 mg total) by mouth daily. 09/18/16   Nita Sells, MD  atorvastatin (LIPITOR) 80 MG tablet Take 1 tablet (80 mg total) by mouth daily at 6 PM. 09/17/16   Nita Sells, MD  Cholecalciferol (VITAMIN D3) 5000 units TABS Take 1 tablet by mouth daily. 09/25/17   [provider]  cloNIDine (CATAPRES) 0.1 MG tablet Take 1 tablet (0.1 mg total) by mouth 2 (two) times daily. 06/15/16   Elnora Morrison, MD  colchicine 0.6 MG tablet Take 2 tablets by mouth and then a third tablet by mouth one hour later 05/26/20   Truddie Hidden, MD  ezetimibe (ZETIA) 10 MG tablet Take 10 mg by mouth daily. 01/24/20   [provider]  famotidine (PEPCID) 20 MG tablet Take 1 tablet (20 mg total) by mouth 2 (two) times daily. 04/06/20   Quintella Reichert, MD  febuxostat (ULORIC) 40 MG tablet Take 40 mg by mouth daily. 03/22/20   [provider]  furosemide (LASIX) 20 MG tablet Take 20-40 mg by  mouth daily. 02/08/20   [provider]  gabapentin (NEURONTIN) 300 MG capsule Take 300 mg by mouth at bedtime. 10/09/17   [provider]  indomethacin (INDOCIN) 50 MG capsule Take 1 capsule (50 mg total) by mouth 2 (two) times daily. 04/16/20   Charlann Lange, PA-C    Allergies    Patient has no known allergies.  Review of Systems   Review of Systems  Constitutional: Negative for chills and fever.  HENT: Negative for facial swelling.   Respiratory: Negative for cough and shortness of breath.   Gastrointestinal: Negative for abdominal pain and vomiting.  Skin: Positive for rash.  Neurological: Negative for syncope.  All other systems reviewed and are negative.   Physical  Exam Updated Vital Signs BP 124/86 (BP Location: Left Arm)   Pulse 82   Temp 98.2 F (36.8 C) (Oral)   Resp 16   SpO2 95%   Physical Exam Vitals and nursing note reviewed.  Constitutional:      General: He is not in acute distress.    Appearance: He is well-developed. He is not toxic-appearing.  HENT:     Head: Normocephalic and atraumatic.  Eyes:     General:        Right eye: No discharge.        Left eye: No discharge.     Conjunctiva/sclera: Conjunctivae normal.  Cardiovascular:     Rate and Rhythm: Normal rate and regular rhythm.  Pulmonary:     Effort: Pulmonary effort is normal. No respiratory distress.     Breath sounds: Normal breath sounds. No wheezing, rhonchi or rales.  Abdominal:     General: There is no distension.     Palpations: Abdomen is soft.     Tenderness: There is no abdominal tenderness.  Musculoskeletal:     Cervical back: Neck supple.  Skin:    General: Skin is warm and dry.     Findings: Rash present. Rash is not nodular, pustular, scaling, urticarial or vesicular.     Comments: Patient has a papular rash to the lateral aspects of the abdomen and to the upper extremities.  This spares the palms and the soles.  Spares the mucous membranes.  Neurological:     Mental Status: He is alert.     Comments: Clear speech.   Psychiatric:        Behavior: Behavior normal.    ED Results / Procedures / Treatments   Labs (all labs ordered are listed, but only abnormal results are displayed) Labs Reviewed - No data to display  EKG None  Radiology No results found.  Procedures Procedures   Medications Ordered in ED Medications - No data to display  ED Course  I have reviewed the triage vital signs and the nursing notes.  Pertinent labs & imaging results that were available during my care of the patient were reviewed by me and considered in my medical decision making (see chart for details).    MDM Rules/Calculators/A&P                            Patient presents to the emergency department with complaints of pruritic rash since yesterday.  Chart review for additional history.  He is nontoxic, resting comfortably, his vitals are within normal limits.  He does not have any complaints of difficulty breathing or swallowing.  Airway is patent.  No findings of angioedema. No blisters, no pustules, no warmth, no draining  sinus tracts, no superficial abscesses, no bullous impetigo, no vesicles, no desquamation, no target lesions with dusky purpura or a central bulla. Not tender to touch.  Spares the mucous membranes as well as the palms/soles.  Do not suspect superimposed infection or  SJS, TEN, TSS, tick borne illness, syphilis or other life-threatening condition. Will discharge home with short course of steroids, Pepcid, and Benadryl.  PCP follow-up.  We have also provided information for dermatology follow-up. I discussed treatment plan, need for follow-up, and return precautions with the patient. Provided opportunity for questions, patient confirmed understanding and is in agreement with plan.     Final Clinical Impression(s) / ED Diagnoses Final diagnoses:  None    Rx / DC Orders ED Discharge Orders    None       Leafy Kindle 12/01/20 0224    Palumbo, April, MD 12/01/20 [id number]

## 2020-12-01 NOTE — Progress Notes (Signed)
Botox Injection for spasticity using needle EMG guidance  Dilution: 50 Units/ml Indication: Severe spasticity which interferes with ADL,mobility and/or  hygiene and is unresponsive to medication management and other conservative care Informed consent was obtained after describing risks and benefits of the procedure with the patient. This includes bleeding, bruising, infection, excessive weakness, or medication side effects. A REMS form is on file and signed. Needle: 25g 2" needle electrode-  Number of units per muscle Botox FCR50 FCU50 FDS50 FDP50  PQ 50 PT 50 All injections were done after obtaining appropriate EMG activity and after negative drawback for blood. The patient tolerated the procedure well. Post procedure instructions were given. A followup appointment was made.

## 2020-12-01 NOTE — ED Notes (Signed)
Patient verbalized understanding of discharge instructions. Opportunity for questions and answers.  

## 2020-12-01 NOTE — Discharge Instructions (Addendum)
You are seen in the ER today for a rash.  We are unsure of the exact cause of this.  We would like you to try the following medicines: -Prednisone: This is a steroid, take daily in the morning with breakfast to try to help with the rash/irritation. -Pepcid: Take twice per day as needed for itching/rash -Benadryl: Take every 6 hours as needed for itching/rash.  We have prescribed you new medication(s) today. Discuss the medications prescribed today with your pharmacist as they can have adverse effects and interactions with your other medicines including over the counter and prescribed medications. Seek medical evaluation if you start to experience new or abnormal symptoms after taking one of these medicines, seek care immediately if you start to experience difficulty breathing, feeling of your throat closing, facial swelling, or rash as these could be indications of a more serious allergic reaction  Please follow-up with your primary care provider and/or dermatology within 3 days for reevaluation.  Return to the ER for new or worsening symptoms including but not limited to worsening rash, new rash, rash to the palms, soles, or the mouth, fever, trouble breathing, sensation of throat closing, vomiting, chest pain, or any other concerns.

## 2020-12-05 ENCOUNTER — Other Ambulatory Visit: Payer: Self-pay

## 2021-01-09 DIAGNOSIS — I672 Cerebral atherosclerosis: Secondary | ICD-10-CM | POA: Diagnosis not present

## 2021-01-09 DIAGNOSIS — I10 Essential (primary) hypertension: Secondary | ICD-10-CM | POA: Diagnosis not present

## 2021-01-09 DIAGNOSIS — I69351 Hemiplegia and hemiparesis following cerebral infarction affecting right dominant side: Secondary | ICD-10-CM | POA: Diagnosis not present

## 2021-01-09 DIAGNOSIS — E785 Hyperlipidemia, unspecified: Secondary | ICD-10-CM | POA: Diagnosis not present

## 2021-01-15 ENCOUNTER — Emergency Department (HOSPITAL_COMMUNITY): Payer: Medicare Other

## 2021-01-15 ENCOUNTER — Encounter (HOSPITAL_COMMUNITY): Payer: Self-pay | Admitting: Emergency Medicine

## 2021-01-15 ENCOUNTER — Emergency Department (HOSPITAL_COMMUNITY)
Admission: EM | Admit: 2021-01-15 | Discharge: 2021-01-15 | Disposition: A | Discharge status: Home / Self Care | Payer: Medicare Other | Attending: Emergency Medicine | Admitting: Emergency Medicine

## 2021-01-15 DIAGNOSIS — M25562 Pain in left knee: Secondary | ICD-10-CM | POA: Diagnosis not present

## 2021-01-15 DIAGNOSIS — W010XXA Fall on same level from slipping, tripping and stumbling without subsequent striking against object, initial encounter: Secondary | ICD-10-CM | POA: Insufficient documentation

## 2021-01-15 DIAGNOSIS — M109 Gout, unspecified: Secondary | ICD-10-CM | POA: Diagnosis not present

## 2021-01-15 DIAGNOSIS — Z79899 Other long term (current) drug therapy: Secondary | ICD-10-CM | POA: Diagnosis not present

## 2021-01-15 DIAGNOSIS — I10 Essential (primary) hypertension: Secondary | ICD-10-CM | POA: Diagnosis not present

## 2021-01-15 DIAGNOSIS — I129 Hypertensive chronic kidney disease with stage 1 through stage 4 chronic kidney disease, or unspecified chronic kidney disease: Secondary | ICD-10-CM | POA: Diagnosis not present

## 2021-01-15 DIAGNOSIS — Z7982 Long term (current) use of aspirin: Secondary | ICD-10-CM | POA: Diagnosis not present

## 2021-01-15 DIAGNOSIS — N189 Chronic kidney disease, unspecified: Secondary | ICD-10-CM | POA: Diagnosis not present

## 2021-01-15 MED ORDER — OXYCODONE-ACETAMINOPHEN 5-325 MG PO TABS
1.0000 | ORAL_TABLET | ORAL | Status: DC | PRN
  Administered 2021-01-15: 1 via ORAL
  Filled 2021-01-15 (×2): qty 1

## 2021-01-15 MED ORDER — INDOMETHACIN 50 MG PO CAPS: 50.0000 mg | ORAL_CAPSULE | Freq: Two times a day (BID) | ORAL | 0 refills | Status: DC

## 2021-01-15 MED ORDER — COLCHICINE 0.6 MG PO TABS
ORAL_TABLET | ORAL | 0 refills | Status: DC
Start: 2021-01-15 — End: 2021-09-06

## 2021-01-15 NOTE — ED Provider Notes (Signed)
[facility] DEPT Provider Note   CSN: [id number] Arrival date & time: 01/15/21  0941     History Chief Complaint  Patient presents with  . Knee Pain  . Gout    Randall Mccall is a 50 y.o. male.  50 year old male with history of gout, reports gout flare in left knee after eating BBQ chicken last night. Given Percocet in triage. States he tripped and fell last night because he is paralyzed on the right side from prior stroke but does not think he injured this knee. No other complaints or concerns.         Past Medical History:  Diagnosis Date  . Gout   . Hypertension   . Muscle weakness   . Stroke [facility])     Patient Active Problem List   Diagnosis Date Noted  . Acquired right foot drop 12/22/2019  . CKD (chronic kidney disease) 12/18/2016  . Benign essential HTN   . Idiopathic gout   . Diastolic dysfunction   . Cocaine abuse ([place])   . Spastic hemiparesis of right dominant side ([place])   . 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 Cerebrovascular accident (CVA) ([place])   . Acute ischemic stroke ([place]) 09/15/2016  . Stroke (cerebrum) ([place]) 09/15/2016  . Hypertension 09/15/2016  . Vitamin B12 deficiency 08/10/2015  . Weakness 07/02/2013  . Abnormal CPK 07/02/2013    Past Surgical History:  Procedure Laterality Date  . KNEE SURGERY         Family History  Problem Relation Age of Onset  . Hypertension Mother   . Cirrhosis Father     Social History   Tobacco Use  . Smoking status: Never Smoker  . Smokeless tobacco: Never Used  Vaping Use  . Vaping Use: Never used  Substance Use Topics  . Alcohol use: Yes    Alcohol/week: 1.0 standard drink    Types: 1 Cans of beer per week    Comment: occ  . Drug use: No    Home Medications Prior to Admission medications   Medication Sig Start Date End Date Taking? Authorizing Provider  amLODipine (NORVASC) 10 MG tablet Take 1  tablet (10 mg total) by mouth daily. 06/15/16   Elnora Morrison, MD  aspirin 325 MG tablet Take 1 tablet (325 mg total) by mouth daily. 09/18/16   Nita Sells, MD  atorvastatin (LIPITOR) 80 MG tablet Take 1 tablet (80 mg total) by mouth daily at 6 PM. 09/17/16   Nita Sells, MD  Cholecalciferol (VITAMIN D3) 5000 units TABS Take 1 tablet by mouth daily. 09/25/17   [provider]  cloNIDine (CATAPRES) 0.1 MG tablet Take 1 tablet (0.1 mg total) by mouth 2 (two) times daily. 06/15/16   Elnora Morrison, MD  colchicine 0.6 MG tablet Take 2 tablets by mouth and then a third tablet by mouth one hour later 01/15/21   Tacy Learn, PA-C  diphenhydrAMINE (BENADRYL) 25 MG tablet Take 1 tablet (25 mg total) by mouth every 6 (six) hours as needed for itching (rash). 12/01/20   Petrucelli, Samantha R, PA-C  ezetimibe (ZETIA) 10 MG tablet Take 10 mg by mouth daily. 01/24/20   [provider]  famotidine (PEPCID) 20 MG tablet Take 1 tablet (20 mg total) by mouth 2 (two) times daily as needed (rash/itching.). 12/01/20   Petrucelli, Aldona Bar R, PA-C  febuxostat (ULORIC) 40 MG tablet Take 40 mg by mouth daily. 03/22/20  [provider]  furosemide (LASIX) 20 MG tablet Take 20-40 mg by mouth daily. 02/08/20   [provider]  gabapentin (NEURONTIN) 300 MG capsule Take 300 mg by mouth at bedtime. 10/09/17   [provider]  indomethacin (INDOCIN) 50 MG capsule Take 1 capsule (50 mg total) by mouth 2 (two) times daily. 01/15/21   Tacy Learn, PA-C    Allergies    Patient has no known allergies.  Review of Systems   Review of Systems  Constitutional: Negative for fever.  Musculoskeletal: Positive for arthralgias and gait problem. Negative for joint swelling.  Skin: Negative for rash and wound.  Allergic/Immunologic: Negative for immunocompromised state.  Neurological: Negative for weakness and numbness.    Physical Exam Updated Vital Signs BP (!) 128/96  (BP Location: Right Arm)   Pulse (!) 105   Temp 98.8 F (37.1 C) (Oral)   Resp 17   SpO2 100%   Physical Exam Vitals and nursing note reviewed.  Constitutional:      General: He is not in acute distress.    Appearance: He is well-developed. He is not diaphoretic.  HENT:     Head: Normocephalic and atraumatic.  Cardiovascular:     Pulses: Normal pulses.  Pulmonary:     Effort: Pulmonary effort is normal.  Musculoskeletal:        General: Tenderness present. No swelling or deformity.     Right knee: No swelling, deformity, effusion, erythema, ecchymosis or crepitus. Normal range of motion. Tenderness present over the medial joint line. No lateral joint line tenderness. Normal pulse.     Right lower leg: No edema.     Left lower leg: No edema.       Legs:  Skin:    General: Skin is warm and dry.     Findings: No bruising, erythema or rash.  Neurological:     Mental Status: He is alert and oriented to person, place, and time.  Psychiatric:        Behavior: Behavior normal.     ED Results / Procedures / Treatments   Labs (all labs ordered are listed, but only abnormal results are displayed) Labs Reviewed - No data to display  EKG None  Radiology DG Knee Complete 4 Views Left  Result Date: 01/15/2021 CLINICAL DATA:  Fall, left knee pain EXAM: LEFT KNEE - COMPLETE 4+ VIEW COMPARISON:  None. FINDINGS: Degenerative spurring and joint space narrowing throughout the left knee. No acute bony abnormality. Specifically, no fracture, subluxation, or dislocation. Small joint effusion. IMPRESSION: Degenerative changes with small joint effusion. No acute bony abnormality. Electronically Signed   By: Rolm Baptise M.D.   On: 01/15/2021 13:29    Procedures Procedures   Medications Ordered in ED Medications  oxyCODONE-acetaminophen (PERCOCET/ROXICET) 5-325 MG per tablet 1 tablet (1 tablet Oral Given 01/15/21 1042)    ED Course  I have reviewed the triage vital signs and the nursing  notes.  Pertinent labs & imaging results that were available during my care of the patient were reviewed by me and considered in my medical decision making (see chart for details).  Clinical Course as of 01/15/21 1336  Sun Jan 15, 6750  3023 50 year old male with complaint of left knee pain which is reported to be similar to prior gout flares in this same knee although did have a fall yesterday. Pain located medial joint line, no erythema, no fevers. XR shows small effusion. Colchine and Indocin refilled, advised to recheck with  his PCP. [LM]    Clinical Course User Index [LM] Roque Lias   MDM Rules/Calculators/A&P                          Final Clinical Impression(s) / ED Diagnoses Final diagnoses:  Acute pain of left knee    Rx / DC Orders ED Discharge Orders         Ordered    colchicine 0.6 MG tablet        01/15/21 1334    indomethacin (INDOCIN) 50 MG capsule  2 times daily        01/15/21 1334           Roque Lias 01/15/21 1336    Daleen Bo, MD 01/15/21 [phone]

## 2021-01-15 NOTE — ED Triage Notes (Signed)
Patient here from home reporting left knee pain - gout. Hx of same. Forgot gout and BP meds at home, staying with friend. Difficulty walking. Hx of stroke right sided deficits.

## 2021-01-15 NOTE — Discharge Instructions (Addendum)
Take Indocin and Colchicine as prescribed for pain in your knee. Recheck with your doctor if not improving.

## 2021-03-07 ENCOUNTER — Encounter: Payer: Medicare Other | Attending: Physical Medicine & Rehabilitation | Admitting: Physical Medicine & Rehabilitation

## 2021-03-07 ENCOUNTER — Encounter: Payer: Self-pay | Admitting: Physical Medicine & Rehabilitation

## 2021-03-07 ENCOUNTER — Other Ambulatory Visit: Payer: Self-pay

## 2021-03-07 VITALS — BP 105/71 | HR 83 | Temp 98.2°F | Ht 73.0 in | Wt 241.2 lb

## 2021-03-07 DIAGNOSIS — G8111 Spastic hemiplegia affecting right dominant side: Secondary | ICD-10-CM | POA: Diagnosis not present

## 2021-03-07 NOTE — Patient Instructions (Addendum)
You received a Xeomin injection today. You may experience soreness at the needle injection sites. Please call us if any of the injection sites turns red after a couple days or if there is any drainage. You may experience muscle weakness as a result of Xeomin This would improve with time but can take several weeks to improve. The Xeomin should start working in about one week. The Xeomin usually last 3 months. The injection can be repeated every 3 months as needed.  

## 2021-03-07 NOTE — Progress Notes (Addendum)
Xeomin Injection for spasticity using needle EMG guidance  Dilution: 50 Units/ml Indication: Severe spasticity which interferes with ADL,mobility and/or  hygiene and is unresponsive to medication management and other conservative care Informed consent was obtained after describing risks and benefits of the procedure with the patient. This includes bleeding, bruising, infection, excessive weakness, or medication side effects. A REMS form is on file and signed. Needle: 25g 2" needle electrode-  Number of units per muscle Botox FCR50 FCU50 FDS50 FDP50  PQ 50 PT 50 All injections were done after obtaining appropriate EMG activity and after negative drawback for blood. The patient tolerated the procedure well. Post procedure instructions were given. A followup appointment was made.

## 2021-04-28 DIAGNOSIS — R600 Localized edema: Secondary | ICD-10-CM | POA: Diagnosis not present

## 2021-04-28 DIAGNOSIS — E7800 Pure hypercholesterolemia, unspecified: Secondary | ICD-10-CM | POA: Diagnosis not present

## 2021-04-28 DIAGNOSIS — I10 Essential (primary) hypertension: Secondary | ICD-10-CM | POA: Diagnosis not present

## 2021-04-28 DIAGNOSIS — M1A09X0 Idiopathic chronic gout, multiple sites, without tophus (tophi): Secondary | ICD-10-CM | POA: Diagnosis not present

## 2021-04-28 DIAGNOSIS — N289 Disorder of kidney and ureter, unspecified: Secondary | ICD-10-CM | POA: Diagnosis not present

## 2021-04-28 DIAGNOSIS — R7401 Elevation of levels of liver transaminase levels: Secondary | ICD-10-CM | POA: Diagnosis not present

## 2021-04-28 DIAGNOSIS — E119 Type 2 diabetes mellitus without complications: Secondary | ICD-10-CM | POA: Diagnosis not present

## 2021-06-07 DIAGNOSIS — R768 Other specified abnormal immunological findings in serum: Secondary | ICD-10-CM | POA: Diagnosis not present

## 2021-06-07 DIAGNOSIS — R748 Abnormal levels of other serum enzymes: Secondary | ICD-10-CM | POA: Diagnosis not present

## 2021-06-09 ENCOUNTER — Other Ambulatory Visit: Payer: Self-pay

## 2021-06-09 ENCOUNTER — Other Ambulatory Visit: Payer: Self-pay | Admitting: Nurse Practitioner

## 2021-06-09 ENCOUNTER — Encounter: Payer: Medicare Other | Attending: Physical Medicine & Rehabilitation | Admitting: Physical Medicine & Rehabilitation

## 2021-06-09 ENCOUNTER — Encounter: Payer: Self-pay | Admitting: Physical Medicine & Rehabilitation

## 2021-06-09 VITALS — BP 107/71 | HR 77 | Temp 98.1°F | Ht 73.0 in | Wt 244.0 lb

## 2021-06-09 DIAGNOSIS — R748 Abnormal levels of other serum enzymes: Secondary | ICD-10-CM

## 2021-06-09 DIAGNOSIS — G8111 Spastic hemiplegia affecting right dominant side: Secondary | ICD-10-CM | POA: Diagnosis not present

## 2021-06-09 NOTE — Progress Notes (Signed)
Xeomin Injection for spasticity using needle EMG guidance  Dilution: 50 Units/ml Indication: Severe spasticity which interferes with ADL,mobility and/or  hygiene and is unresponsive to medication management and other conservative care Informed consent was obtained after describing risks and benefits of the procedure with the patient. This includes bleeding, bruising, infection, excessive weakness, or medication side effects. A REMS form is on file and signed. Needle: 25g 2" needle electrode-  Number of units per muscle Botox FCR50 FCU50 FDS50 FDP50  PQ 50 PT 50 All injections were done after obtaining appropriate EMG activity and after negative drawback for blood. The patient tolerated the procedure well. Post procedure instructions were given. A followup appointment was made.

## 2021-06-09 NOTE — Patient Instructions (Signed)
You received a Botulinum injection today. You may experience soreness at the needle injection sites. Please call us if any of the injection sites turns red after a couple days or if there is any drainage. You may experience muscle weakness as a result of Xeomin. This would improve with time but can take several weeks to improve. The Xeomin should start working in about one week. The Xeomin usually last 3 months. The injection can be repeated every 3 months as needed.

## 2021-06-13 ENCOUNTER — Other Ambulatory Visit: Payer: Self-pay

## 2021-06-13 ENCOUNTER — Emergency Department (HOSPITAL_COMMUNITY): Payer: Medicare Other

## 2021-06-13 ENCOUNTER — Emergency Department (HOSPITAL_COMMUNITY)
Admission: EM | Admit: 2021-06-13 | Discharge: 2021-06-13 | Disposition: A | Discharge status: Home / Self Care | Payer: Medicare Other | Attending: Emergency Medicine | Admitting: Emergency Medicine

## 2021-06-13 ENCOUNTER — Encounter (HOSPITAL_COMMUNITY): Payer: Self-pay

## 2021-06-13 DIAGNOSIS — I129 Hypertensive chronic kidney disease with stage 1 through stage 4 chronic kidney disease, or unspecified chronic kidney disease: Secondary | ICD-10-CM | POA: Diagnosis not present

## 2021-06-13 DIAGNOSIS — I672 Cerebral atherosclerosis: Secondary | ICD-10-CM | POA: Diagnosis not present

## 2021-06-13 DIAGNOSIS — R531 Weakness: Secondary | ICD-10-CM | POA: Insufficient documentation

## 2021-06-13 DIAGNOSIS — N189 Chronic kidney disease, unspecified: Secondary | ICD-10-CM | POA: Diagnosis not present

## 2021-06-13 DIAGNOSIS — Z7982 Long term (current) use of aspirin: Secondary | ICD-10-CM | POA: Diagnosis not present

## 2021-06-13 DIAGNOSIS — Z79899 Other long term (current) drug therapy: Secondary | ICD-10-CM | POA: Diagnosis not present

## 2021-06-13 LAB — I-STAT CHEM 8, ED
BUN: 29 mg/dL — ABNORMAL HIGH (ref 6–20)
Calcium, Ion: 1.13 mmol/L — ABNORMAL LOW (ref 1.15–1.40)
Chloride: 107 mmol/L (ref 98–111)
Creatinine, Ser: 1.2 mg/dL (ref 0.61–1.24)
Glucose, Bld: 88 mg/dL (ref 70–99)
HCT: 45 % (ref 39.0–52.0)
Hemoglobin: 15.3 g/dL (ref 13.0–17.0)
Potassium: 3.9 mmol/L (ref 3.5–5.1)
Sodium: 141 mmol/L (ref 135–145)
TCO2: 23 mmol/L (ref 22–32)

## 2021-06-13 MED ORDER — GABAPENTIN 300 MG PO CAPS: 300.0000 mg | ORAL_CAPSULE | Freq: Two times a day (BID) | ORAL | 0 refills | Status: DC

## 2021-06-13 NOTE — ED Triage Notes (Signed)
Pt reports knot on his left wrist that he noticed this morning, denies pain but states it is affecting his grip strength. Hx of stroke that affected his right side.

## 2021-06-13 NOTE — ED Notes (Signed)
Obtained istat chem 8 and took to mini lab

## 2021-06-13 NOTE — Discharge Instructions (Addendum)
As discussed, with your history of a stroke, and today's presentation for weakness it is important that you monitor your condition carefully even though today's studies were reassuring.  Please call your physician tomorrow for appropriate ongoing outpatient management of your weakness.  Do not hesitate to return here for concerning changes. When you meet with your physician, please discuss your medication regimen to ensure that you are taking an appropriate group of meds to minimize stroke risk and for gout control.

## 2021-06-13 NOTE — ED Provider Notes (Signed)
[facility] EMERGENCY DEPARTMENT Provider Note   CSN: [id number] Arrival date & time: 06/13/21  1346     History Chief Complaint  Patient presents with   Extremity Weakness    [facility] is a 50 y.o. male.  HPI Patient presents with left upper extremity weakness. He has multiple medical issues, including history of gout, stroke, with baseline right-sided weakness.  He does note, however, that is improved following his stroke, with therapy, and he is ambulatory. Today, without obvious last known normal time, patient developed left distal upper extremity weakness.  He noticed a possible bump in the area of his left dorsal wrist as well, but notes that this is not painful, he was simply unaware of it previously. Who states that over the day he has had difficulty with grip, weakness, coordination in the left hand and wrist.     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 ([place])    Cerebrovascular accident (CVA) ([place])    Acute ischemic stroke ([place]) 09/15/2016   Stroke (cerebrum) ([place]) 09/15/2016   Hypertension 09/15/2016   Vitamin B12 deficiency 08/10/2015   Weakness 07/02/2013   Abnormal CPK 07/02/2013    Past Surgical History:  Procedure Laterality Date   KNEE SURGERY         Family History  Problem Relation Age of Onset   Hypertension Mother    Cirrhosis Father     Social History   Tobacco Use   Smoking status: Never   Smokeless tobacco: Never  Vaping Use   Vaping Use: Never used  Substance Use Topics   Alcohol  use: Yes    Alcohol/week: 1.0 standard drink    Types: 1 Cans of beer per week    Comment: occ   Drug use: No    Home Medications Prior to Admission medications   Medication Sig Start Date End Date Taking? Authorizing Provider  allopurinol (ZYLOPRIM) 100 MG tablet Take 100 mg by mouth daily. 12/19/20  Yes [provider]  amLODipine-benazepril (LOTREL) 10-20 MG capsule Take 1 capsule by mouth daily. 12/15/20  Yes [provider]  aspirin 325 MG tablet Take 1 tablet (325 mg total) by mouth daily. 09/18/16  Yes Nita Sells, MD  atorvastatin (LIPITOR) 80 MG tablet Take 1 tablet (80 mg total) by mouth daily at 6 PM. 09/17/16  Yes Nita Sells, MD  Cholecalciferol (VITAMIN D3) 5000 units TABS Take 1 tablet by mouth daily. 09/25/17  Yes [provider]  cloNIDine (CATAPRES) 0.1 MG tablet Take 1 tablet (0.1 mg total) by mouth 2 (two) times daily. 06/15/16  Yes Elnora Morrison, MD  colchicine 0.6 MG tablet Take 2 tablets by mouth and then a third tablet by mouth one hour later 01/15/21  Yes Tacy Learn, PA-C  diphenhydrAMINE (BENADRYL) 25 MG tablet Take 1 tablet (25 mg total) by mouth every 6 (six) hours as needed for itching (rash). 12/01/20  Yes Petrucelli, Samantha R, PA-C  ezetimibe (ZETIA) 10 MG tablet  Take 10 mg by mouth daily. 01/24/20  Yes [provider]  famotidine (PEPCID) 20 MG tablet Take 1 tablet (20 mg total) by mouth 2 (two) times daily as needed (rash/itching.). 12/01/20  Yes Petrucelli, Samantha R, PA-C  febuxostat (ULORIC) 40 MG tablet Take 40 mg by mouth daily. 03/22/20  Yes [provider]  furosemide (LASIX) 20 MG tablet Take 20-40 mg by mouth daily. 02/08/20  Yes [provider]  indomethacin (INDOCIN) 50 MG capsule Take 1 capsule (50 mg total) by mouth 2 (two) times daily. 01/15/21  Yes Tacy Learn, PA-C  gabapentin (NEURONTIN) 300 MG capsule Take 1 capsule (300 mg total) by mouth 2 (two) times daily for 15 days.  06/13/21 06/28/21  Carmin Muskrat, MD    Allergies    Patient has no known allergies.  Review of Systems   Review of Systems  Constitutional:        Per HPI, otherwise negative  HENT:         Per HPI, otherwise negative  Respiratory:         Per HPI, otherwise negative  Cardiovascular:        Per HPI, otherwise negative  Gastrointestinal:  Negative for vomiting.  Endocrine:       Negative aside from HPI  Genitourinary:        Neg aside from HPI   Musculoskeletal:        Per HPI, otherwise negative  Skin: Negative.   Neurological:  Positive for weakness. Negative for syncope.   Physical Exam Updated Vital Signs BP 112/82 (BP Location: Left Arm)   Pulse 78   Temp 98 F (36.7 C) (Oral)   Resp 15   Ht 6\' 1"  (1.854 m)   Wt 111.6 kg   SpO2 99%   BMI 32.46 kg/m   Physical Exam Vitals and nursing note reviewed.  Constitutional:      General: He is not in acute distress.    Appearance: He is well-developed.  HENT:     Head: Normocephalic and atraumatic.  Eyes:     Conjunctiva/sclera: Conjunctivae normal.  Cardiovascular:     Rate and Rhythm: Normal rate and regular rhythm.  Pulmonary:     Effort: Pulmonary effort is normal. No respiratory distress.     Breath sounds: No stridor.  Abdominal:     General: There is no distension.  Musculoskeletal:       Arms:  Skin:    General: Skin is warm and dry.  Neurological:     Mental Status: He is alert and oriented to person, place, and time.     Cranial Nerves: No dysarthria or facial asymmetry.     Comments: Grip strength asym, as above. R weakness, unchanged (per patient)    ED Results / Procedures / Treatments   Labs (all labs ordered are listed, but only abnormal results are displayed) Labs Reviewed  I-STAT CHEM 8, ED - Abnormal; Notable for the following components:      Result Value   BUN 29 (*)    Calcium, Ion 1.13 (*)    All other components within normal limits      Radiology CT Head Wo  Contrast  Result Date: 06/13/2021 CLINICAL DATA:  Left upper extremity weakness EXAM: CT HEAD WITHOUT CONTRAST TECHNIQUE: Contiguous axial images were obtained from the base of the skull through the vertex without intravenous contrast. COMPARISON:  None. FINDINGS: Brain: No evidence of acute infarction, hemorrhage, hydrocephalus, extra-axial collection or mass lesion/mass effect.  Vascular: Intracranial atherosclerosis. Skull: Normal. Negative for fracture or focal lesion. Sinuses/Orbits: The visualized paranasal sinuses are essentially clear. The mastoid air cells are unopacified. Other: None. IMPRESSION: Normal head CT. Electronically Signed   By: Julian Hy M.D.   On: 06/13/2021 19:10    Procedures Procedures   Medications Ordered in ED Medications - No data to display  ED Course  I have reviewed the triage vital signs and the nursing notes.  Pertinent labs & imaging results that were available during my care of the patient were reviewed by me and considered in my medical decision making (see chart for details).  Adult male with multiple medical issues including prior stroke presents with 1 day of weakness in his left hand and wrist.  Patient also notes new physical lesion, though there is some suspicion for this being chronic, as it is neither painful, nor erythematous.  Patient's evaluation here including blood work, CT reassuring, no CT evidence for hemorrhage, mass, nor obvious stroke.  Low suspicion for small infarct affecting sole of the wrist, greater suspicion for the patient's history of inflammatory disorders contributing to this given the otherwise reassuring neurologic exam, isolated complaint of wrist deficiency, concurrent with his noticing abnormalities of his musculature. No evidence for infection, septic arthritis.  Patient comfortable with discharge with outpatient follow-up, after initiation of analgesics. MDM Rules/Calculators/A&P MDM Number of Diagnoses or Management  Options Weakness: new, needed workup   Amount and/or Complexity of Data Reviewed Clinical lab tests: ordered and reviewed Tests in the radiology section of CPT: ordered and reviewed Tests in the medicine section of CPT: reviewed and ordered Decide to obtain previous medical records or to obtain history from someone other than the patient: yes Review and summarize past medical records: yes Independent visualization of images, tracings, or specimens: yes  Risk of Complications, Morbidity, and/or Mortality Presenting problems: high Diagnostic procedures: high Management options: high  Critical Care Total time providing critical care: < 30 minutes  Patient Progress Patient progress: stable   Final Clinical Impression(s) / ED Diagnoses Final diagnoses:  Weakness    Rx / DC Orders ED Discharge Orders          Ordered    gabapentin (NEURONTIN) 300 MG capsule  2 times daily        06/13/21 1937             Carmin Muskrat, MD 06/13/21 1958

## 2021-06-14 ENCOUNTER — Emergency Department (HOSPITAL_COMMUNITY): Payer: Medicare Other

## 2021-06-14 ENCOUNTER — Encounter (HOSPITAL_COMMUNITY): Payer: Self-pay | Admitting: Oncology

## 2021-06-14 ENCOUNTER — Other Ambulatory Visit: Payer: Self-pay

## 2021-06-14 ENCOUNTER — Emergency Department (HOSPITAL_COMMUNITY)
Admission: EM | Admit: 2021-06-14 | Discharge: 2021-06-14 | Disposition: A | Discharge status: Home / Self Care | Payer: Medicare Other | Attending: Emergency Medicine | Admitting: Emergency Medicine

## 2021-06-14 DIAGNOSIS — M109 Gout, unspecified: Secondary | ICD-10-CM | POA: Diagnosis not present

## 2021-06-14 DIAGNOSIS — Z7982 Long term (current) use of aspirin: Secondary | ICD-10-CM | POA: Diagnosis not present

## 2021-06-14 DIAGNOSIS — I129 Hypertensive chronic kidney disease with stage 1 through stage 4 chronic kidney disease, or unspecified chronic kidney disease: Secondary | ICD-10-CM | POA: Insufficient documentation

## 2021-06-14 DIAGNOSIS — M25532 Pain in left wrist: Secondary | ICD-10-CM | POA: Diagnosis present

## 2021-06-14 DIAGNOSIS — M19042 Primary osteoarthritis, left hand: Secondary | ICD-10-CM | POA: Diagnosis not present

## 2021-06-14 DIAGNOSIS — Z79899 Other long term (current) drug therapy: Secondary | ICD-10-CM | POA: Insufficient documentation

## 2021-06-14 DIAGNOSIS — N189 Chronic kidney disease, unspecified: Secondary | ICD-10-CM | POA: Insufficient documentation

## 2021-06-14 DIAGNOSIS — M19032 Primary osteoarthritis, left wrist: Secondary | ICD-10-CM | POA: Diagnosis not present

## 2021-06-14 DIAGNOSIS — M7989 Other specified soft tissue disorders: Secondary | ICD-10-CM | POA: Diagnosis not present

## 2021-06-14 MED ORDER — PREDNISONE 10 MG (21) PO TBPK: ORAL_TABLET | Freq: Every day | ORAL | 0 refills | Status: DC

## 2021-06-14 NOTE — Discharge Instructions (Addendum)
You were seen in the emergency department today for left hand and wrist pain.  I think that your symptoms are related to a gout flare.  The x-rays we performed of your hand and wrist did not show any fractures or dislocations, but I did comment on severe osteoarthritis which could also be contributing to your symptoms.  I am prescribing you a course of steroids, this should help with both the inflammation and the pain.  Continue taking your normal medications at home.  Also like you to try wearing your wrist brace for the next couple days.   I would like you to follow-up with your primary care doctor in terms of your long-term gout management.  It has been a pleasure seeing and caring for you today and I hope you start feeling better soon!

## 2021-06-14 NOTE — ED Notes (Signed)
An After Visit Summary was printed and given to the patient. Discharge instructions given and no further questions at this time.  

## 2021-06-14 NOTE — ED Triage Notes (Signed)
Pt presents d/t left wrist pain that began yesterday.  Pt also c/o numbness and tingling to left hand.

## 2021-06-14 NOTE — ED Provider Notes (Signed)
[facility] DEPT Provider Note   CSN: [id number] Arrival date & time: 06/14/21  1646     History Chief Complaint  Patient presents with   Wrist Pain    [facility] is a 50 y.o. male with past medical history of gout and stroke with right-sided weakness who presents with complaint of left wrist pain starting yesterday.  He states that he has pain mostly in the dorsum of his left hand, made worse with trying to open doorknobs, making a fist or turning his wrist. No recent injury to his hand.  He states that his pain feels similarly to his previous gout flares.  He has been trying his prescribed indomethacin and colchicine at home without relief.  Patient was seen in the emergency department at [facility] yesterday for complaints of left extremity weakness.  At that time he was complaining of difficulty with grip of his left hand.  He had CT head imaging that was negative for stroke.  He was discharged home with prescription for gabapentin.  He states that compared to yesterday, his symptoms have not gotten worse or changed in any way.  He just states that they are not improving.   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 ([place])    Cerebrovascular accident (CVA) ([place])    Acute ischemic stroke ([place]) 09/15/2016   Stroke (cerebrum) ([place]) 09/15/2016   Hypertension 09/15/2016   Vitamin B12 deficiency 08/10/2015   Weakness 07/02/2013   Abnormal CPK 07/02/2013    Past Surgical History:  Procedure Laterality  Date   KNEE SURGERY         Family History  Problem Relation Age of Onset   Hypertension Mother    Cirrhosis Father     Social History   Tobacco Use   Smoking status: Never   Smokeless tobacco: Never  Vaping Use   Vaping Use: Never used  Substance Use Topics   Alcohol use: Yes    Alcohol/week: 1.0 standard drink    Types: 1 Cans of beer per week    Comment: occ   Drug use: No    Home Medications Prior to Admission medications   Medication Sig Start Date End Date Taking? Authorizing Provider  allopurinol (ZYLOPRIM) 100 MG tablet Take 100 mg by mouth daily. 12/19/20   [provider]  amLODipine-benazepril (LOTREL) 10-20 MG capsule Take 1 capsule by mouth daily. 12/15/20   [provider]  aspirin 325 MG tablet Take 1 tablet (325 mg total) by mouth daily. 09/18/16   Nita Sells, MD  atorvastatin (LIPITOR) 80 MG tablet Take 1 tablet (80 mg total) by mouth daily at 6 PM. 09/17/16   Nita Sells, MD  Cholecalciferol (VITAMIN D3) 5000 units TABS Take 1 tablet by mouth daily. 09/25/17   [provider]  cloNIDine (CATAPRES) 0.1 MG tablet Take 1 tablet (0.1 mg total) by mouth 2 (two) times daily. 06/15/16   Elnora Morrison, MD  colchicine 0.6 MG tablet Take 2 tablets by mouth and then a third tablet by mouth one hour later 01/15/21   Tacy Learn, PA-C  diphenhydrAMINE (BENADRYL) 25 MG tablet Take 1 tablet (25 mg total) by mouth every 6 (six) hours as needed for itching (rash). 12/01/20   Petrucelli, Samantha R, PA-C  ezetimibe (ZETIA) 10 MG tablet Take 10 mg by mouth daily. 01/24/20   [provider]  famotidine (PEPCID) 20 MG tablet Take 1 tablet (20 mg total) by mouth 2 (two) times daily as needed (rash/itching.). 12/01/20   Petrucelli, Aldona Bar R, PA-C  febuxostat (ULORIC) 40 MG tablet Take 40 mg by mouth daily. 03/22/20   [provider]  furosemide (LASIX) 20 MG tablet Take 20-40 mg by mouth daily. 02/08/20   [provider]  gabapentin (NEURONTIN) 300 MG capsule Take 1 capsule (300 mg total) by mouth 2 (two) times daily for 15 days. 06/13/21 06/28/21  Carmin Muskrat, MD  indomethacin (INDOCIN) 50 MG capsule Take 1 capsule (50 mg total) by mouth 2 (two) times daily. 01/15/21   Tacy Learn, PA-C  predniSONE (STERAPRED UNI-PAK 21 TAB) 10 MG (21) TBPK tablet Take by mouth daily. Take 6 tabs by mouth daily  for 2 days, then 5 tabs for 2 days, then 4 tabs for 2 days, then 3 tabs for 2 days, 2 tabs for 2 days, then 1 tab by mouth daily for 2 days 06/14/21  Yes Manuel Lawhead T, PA-C    Allergies    Patient has no known allergies.  Review of Systems   Review of Systems  Constitutional:  Negative for chills and fever.  Musculoskeletal:  Positive for joint swelling.       Left hand pain   Physical Exam Updated Vital Signs Ht 6\' 1"  (1.854 m)   Wt 109.8 kg   BMI 31.93 kg/m   Physical Exam Vitals and nursing note reviewed.  Constitutional:      Appearance: Normal appearance.  HENT:     Head: Normocephalic and atraumatic.  Eyes:     Conjunctiva/sclera: Conjunctivae normal.  Pulmonary:     Effort: Pulmonary effort is normal. No respiratory distress.  Musculoskeletal:     Comments: Full passive ROM of left wrist, hand, and digits.  4/5 grip strength of left hand due to pain.  Pulses equal and normal, sensation intact in bilateral upper extremities.  Prominent swelling over ulnar styloid region, no tenderness, erythema or warmth.  There is tenderness to palpation over the dorsum of the hand specifically over the bases of the third and fourth digits.  No obvious deformities palpated of the hand.  Skin:    General: Skin is warm and dry.  Neurological:     Mental Status: He is alert.  Psychiatric:        Mood and Affect: Mood normal.        Behavior: Behavior normal.    ED Results / Procedures / Treatments   Labs (all labs ordered are listed, but only abnormal results are displayed) Labs  Reviewed - No data to display  EKG None  Radiology DG Wrist Complete Left  Result Date: 06/14/2021 CLINICAL DATA:  Pain. EXAM: LEFT WRIST - COMPLETE 3+ VIEW COMPARISON:  Left hand x-ray 05/26/2020. FINDINGS: There is no acute fracture or dislocation identified. There is severe ulnar carpal and radiocarpal joint space narrowing with subchondral cystic change and sclerosis. This has progressed compared to the prior study. There is also diffuse intercarpal joint space  narrowing similar to the prior study. Osteophyte formation along the ventral aspect of the distal radius and ulna has increased. There is soft tissue swelling surrounding the wrist. IMPRESSION: 1. Severe osteoarthritis of the wrist has progressed compared to the prior study. 2. No acute fracture or dislocation. 3. Soft tissue swelling of the wrist. Electronically Signed   By: Ronney Asters M.D.   On: 06/14/2021 19:03   CT Head Wo Contrast  Result Date: 06/13/2021 CLINICAL DATA:  Left upper extremity weakness EXAM: CT HEAD WITHOUT CONTRAST TECHNIQUE: Contiguous axial images were obtained from the base of the skull through the vertex without intravenous contrast. COMPARISON:  None. FINDINGS: Brain: No evidence of acute infarction, hemorrhage, hydrocephalus, extra-axial collection or mass lesion/mass effect. Vascular: Intracranial atherosclerosis. Skull: Normal. Negative for fracture or focal lesion. Sinuses/Orbits: The visualized paranasal sinuses are essentially clear. The mastoid air cells are unopacified. Other: None. IMPRESSION: Normal head CT. Electronically Signed   By: [facility] Hy M.D.   On: 06/13/2021 19:10   DG Hand 2 View Left  Result Date: 06/14/2021 CLINICAL DATA:  Pain EXAM: LEFT HAND - 2 VIEW COMPARISON:  05/26/2020, multiple prior radiographs. FINDINGS: There is no evidence of acute fracture. Appearance of lunotriquetral coalition. There is scapholunate dissociation with proximal migration of the capitate. There is  severe radiocarpal and intercarpal degenerative arthritis, similar prior exam. Severe DRUJ degenerative change. Moderate base of thumb and thumb MCP osteoarthritis. Moderate osteoarthritis of the fifth digit proximal interphalangeal joint and scattered mild additional interphalangeal joint degenerative change. No osseous erosions. IMPRESSION: Unchanged severe wrist arthritis with findings compatible with [facility] wrist. Moderate base of thumb, thumb MCP, and fifth digit PIP joint osteoarthritis. No acute osseous abnormality Electronically Signed   By: Maurine Simmering M.D.   On: 06/14/2021 19:00    Procedures Procedures   Medications Ordered in ED Medications - No data to display  ED Course  I have reviewed the triage vital signs and the nursing notes.  Pertinent labs & imaging results that were available during my care of the patient were reviewed by me and considered in my medical decision making (see chart for details).    MDM Rules/Calculators/A&P                           Patient is 50 year old male with history of gout and stroke with right-sided weakness who presents with left hand pain.  He states his pain is worse on the backside of his hand, and is made worse by trying to open a doorknob.  He states that his pain feels similarly to his previous gout flares.  Upon chart review patient was seen in the emergency department yesterday for similar symptoms, including weakness of his left hand.  He had CT imaging performed of his head that showed no acute stroke.  Other work-up was benign.  He was discharged home with prescription for gabapentin.  Compared to yesterday he states that his symptoms have not worsened or changed in any way, but they have not improved.  On exam patient has full passive ROM of left wrist, left hand and all digits.  He is neurovascularly intact as above.  There is prominent swelling over the ulnar styloid region, there is no tenderness to palpation, erythema or warmth.  He  does have some tenderness to palpation of the dorsum of his left hand specifically over the bases of the third and fourth digits.  Low concern for infection or septic  joint.  I believe patient's symptoms are most likely related to a gout flare.  X-rays negative for acute fracture or dislocation, commented on severe osteoarthritis of both the hand and wrist.    Patient is otherwise stable and not requiring admission or inpatient treatment for symptoms at this time.  He states he has been trying colchicine and indomethacin at home.  Plan to add on steroid Dosepak.  Discussed reasons to return to the emergency department.  Encouraged follow-up with his primary care provider.  Patient agreeable to plan.  Final Clinical Impression(s) / ED Diagnoses Final diagnoses:  Acute gout of left hand, unspecified cause    Rx / DC Orders ED Discharge Orders          Ordered    predniSONE (STERAPRED UNI-PAK 21 TAB) 10 MG (21) TBPK tablet  Daily        06/14/21 1918             Estill Cotta 06/14/21 Oris Drone, MD 06/14/21 2314

## 2021-06-22 ENCOUNTER — Ambulatory Visit
Admission: RE | Admit: 2021-06-22 | Discharge: 2021-06-22 | Disposition: A | Discharge status: Home / Self Care | Payer: Medicare Other | Source: Ambulatory Visit | Attending: Nurse Practitioner | Admitting: Nurse Practitioner

## 2021-06-22 DIAGNOSIS — R748 Abnormal levels of other serum enzymes: Secondary | ICD-10-CM | POA: Diagnosis not present

## 2021-07-14 DIAGNOSIS — E785 Hyperlipidemia, unspecified: Secondary | ICD-10-CM | POA: Diagnosis not present

## 2021-07-14 DIAGNOSIS — I69351 Hemiplegia and hemiparesis following cerebral infarction affecting right dominant side: Secondary | ICD-10-CM | POA: Diagnosis not present

## 2021-07-14 DIAGNOSIS — I10 Essential (primary) hypertension: Secondary | ICD-10-CM | POA: Diagnosis not present

## 2021-07-14 DIAGNOSIS — I672 Cerebral atherosclerosis: Secondary | ICD-10-CM | POA: Diagnosis not present

## 2021-07-28 DIAGNOSIS — R600 Localized edema: Secondary | ICD-10-CM | POA: Diagnosis not present

## 2021-07-28 DIAGNOSIS — E7800 Pure hypercholesterolemia, unspecified: Secondary | ICD-10-CM | POA: Diagnosis not present

## 2021-07-28 DIAGNOSIS — M1A09X0 Idiopathic chronic gout, multiple sites, without tophus (tophi): Secondary | ICD-10-CM | POA: Diagnosis not present

## 2021-07-28 DIAGNOSIS — Z23 Encounter for immunization: Secondary | ICD-10-CM | POA: Diagnosis not present

## 2021-07-28 DIAGNOSIS — E119 Type 2 diabetes mellitus without complications: Secondary | ICD-10-CM | POA: Diagnosis not present

## 2021-07-28 DIAGNOSIS — I10 Essential (primary) hypertension: Secondary | ICD-10-CM | POA: Diagnosis not present

## 2021-09-05 ENCOUNTER — Other Ambulatory Visit: Payer: Self-pay

## 2021-09-05 ENCOUNTER — Encounter (HOSPITAL_COMMUNITY): Payer: Self-pay

## 2021-09-05 ENCOUNTER — Emergency Department (HOSPITAL_COMMUNITY)
Admission: EM | Admit: 2021-09-05 | Discharge: 2021-09-06 | Disposition: A | Discharge status: Home / Self Care | Payer: Medicare Other | Attending: Emergency Medicine | Admitting: Emergency Medicine

## 2021-09-05 DIAGNOSIS — S60222A Contusion of left hand, initial encounter: Secondary | ICD-10-CM | POA: Insufficient documentation

## 2021-09-05 DIAGNOSIS — I10 Essential (primary) hypertension: Secondary | ICD-10-CM | POA: Diagnosis not present

## 2021-09-05 DIAGNOSIS — S6992XA Unspecified injury of left wrist, hand and finger(s), initial encounter: Secondary | ICD-10-CM | POA: Diagnosis present

## 2021-09-05 DIAGNOSIS — M7989 Other specified soft tissue disorders: Secondary | ICD-10-CM | POA: Diagnosis not present

## 2021-09-05 DIAGNOSIS — W010XXA Fall on same level from slipping, tripping and stumbling without subsequent striking against object, initial encounter: Secondary | ICD-10-CM | POA: Insufficient documentation

## 2021-09-05 DIAGNOSIS — M25562 Pain in left knee: Secondary | ICD-10-CM | POA: Insufficient documentation

## 2021-09-05 DIAGNOSIS — Z7982 Long term (current) use of aspirin: Secondary | ICD-10-CM | POA: Insufficient documentation

## 2021-09-05 DIAGNOSIS — Z79899 Other long term (current) drug therapy: Secondary | ICD-10-CM | POA: Diagnosis not present

## 2021-09-05 NOTE — ED Triage Notes (Signed)
Pt arrives POV for eval of R hand and R knee pain. Denies known injury or trauma. States hx of gout and this feels similar. Pt reports compliance w/ gout meds

## 2021-09-06 ENCOUNTER — Emergency Department (HOSPITAL_COMMUNITY): Payer: Medicare Other

## 2021-09-06 DIAGNOSIS — S60222A Contusion of left hand, initial encounter: Secondary | ICD-10-CM | POA: Diagnosis not present

## 2021-09-06 DIAGNOSIS — M7989 Other specified soft tissue disorders: Secondary | ICD-10-CM | POA: Diagnosis not present

## 2021-09-06 MED ORDER — HYDROCODONE-ACETAMINOPHEN 5-325 MG PO TABS
1.0000 | ORAL_TABLET | Freq: Once | ORAL | Status: AC
  Administered 2021-09-06: 1 via ORAL
  Filled 2021-09-06: qty 1

## 2021-09-06 MED ORDER — COLCHICINE 0.6 MG PO TABS: ORAL_TABLET | ORAL | 0 refills | Status: DC

## 2021-09-06 NOTE — Discharge Instructions (Signed)
You were seen today for knee and hand pain.  Your x-rays do not show any evidence of fracture.  You may have a contusion.  You may also have a mild gout flare.  Your colchicine was refilled.  Continue your allopurinol at home.

## 2021-09-06 NOTE — ED Provider Notes (Signed)
[facility] EMERGENCY DEPARTMENT Provider Note   CSN: [id number] Arrival date & time: 09/05/21  1722     History  Chief Complaint  Patient presents with   Hand Pain        Knee Pain    Randall Mccall is a 51 y.o. male.  HPI     This is a 51 year old male with a history of CVA, gout, hypertension who presents with left hand and left knee pain.  Patient reports he was outside with his dog when he tripped and fell.  He uses left hand to catch him.  Since that time he has had pain in the left hand and the left knee.  He did not hit his left knee.  He feels that his pain is an exacerbation of gout.  He states that he is paralyzed on his right side so often overuses his left side.  He takes colchicine daily for gout.  Has not had any fevers.  Denies any systemic symptoms.  Home Medications Prior to Admission medications   Medication Sig Start Date End Date Taking? Authorizing Provider  allopurinol (ZYLOPRIM) 100 MG tablet Take 100 mg by mouth daily. 12/19/20   [provider]  amLODipine-benazepril (LOTREL) 10-20 MG capsule Take 1 capsule by mouth daily. 12/15/20   [provider]  aspirin 325 MG tablet Take 1 tablet (325 mg total) by mouth daily. 09/18/16   Nita Sells, MD  atorvastatin (LIPITOR) 80 MG tablet Take 1 tablet (80 mg total) by mouth daily at 6 PM. 09/17/16   Nita Sells, MD  Cholecalciferol (VITAMIN D3) 5000 units TABS Take 1 tablet by mouth daily. 09/25/17   [provider]  cloNIDine (CATAPRES) 0.1 MG tablet Take 1 tablet (0.1 mg total) by mouth 2 (two) times daily. 06/15/16   Elnora Morrison, MD  colchicine 0.6 MG tablet Take 2 tablets by mouth and then a third tablet by mouth one hour later 09/06/21   Atalie Oros, Barbette Hair, MD  diphenhydrAMINE (BENADRYL) 25 MG tablet Take 1 tablet (25 mg total) by mouth every 6 (six) hours as needed for itching (rash). 12/01/20   Petrucelli, Samantha R, PA-C  ezetimibe (ZETIA) 10 MG  tablet Take 10 mg by mouth daily. 01/24/20   [provider]  famotidine (PEPCID) 20 MG tablet Take 1 tablet (20 mg total) by mouth 2 (two) times daily as needed (rash/itching.). 12/01/20   Petrucelli, Aldona Bar R, PA-C  febuxostat (ULORIC) 40 MG tablet Take 40 mg by mouth daily. 03/22/20   [provider]  furosemide (LASIX) 20 MG tablet Take 20-40 mg by mouth daily. 02/08/20   [provider]  gabapentin (NEURONTIN) 300 MG capsule Take 1 capsule (300 mg total) by mouth 2 (two) times daily for 15 days. 06/13/21 06/28/21  Carmin Muskrat, MD  indomethacin (INDOCIN) 50 MG capsule Take 1 capsule (50 mg total) by mouth 2 (two) times daily. 01/15/21   Tacy Learn, PA-C  predniSONE (STERAPRED UNI-PAK 21 TAB) 10 MG (21) TBPK tablet Take by mouth daily. Take 6 tabs by mouth daily  for 2 days, then 5 tabs for 2 days, then 4 tabs for 2 days, then 3 tabs for 2 days, 2 tabs for 2 days, then 1 tab by mouth daily for 2 days 06/14/21   Roemhildt, Lorin T, PA-C      Allergies    Patient has no known allergies.    Review of Systems   Review of Systems  Constitutional:  Negative  for fever.  Musculoskeletal:        Hand, knee pain  All other systems reviewed and are negative.  Physical Exam Updated Vital Signs BP 137/88 (BP Location: Left Arm)    Pulse 80    Temp 97.8 F (36.6 C) (Oral)    Resp 17    Ht 1.854 m (6\' 1" )    Wt 110 kg    SpO2 100%    BMI 31.99 kg/m  Physical Exam Vitals and nursing note reviewed.  Constitutional:      Appearance: He is well-developed. He is obese. He is not ill-appearing.  HENT:     Head: Normocephalic and atraumatic.     Mouth/Throat:     Mouth: Mucous membranes are moist.  Eyes:     Pupils: Pupils are equal, round, and reactive to light.  Cardiovascular:     Rate and Rhythm: Normal rate and regular rhythm.  Pulmonary:     Effort: Pulmonary effort is normal. No respiratory distress.  Abdominal:     Palpations: Abdomen is soft.   Musculoskeletal:     Cervical back: Neck supple.     Comments: No obvious deformity of the left hand, no snuffbox tenderness, flexion and extension of all 5 digits intact Left knee without obvious effusion, no overlying skin changes, normal range of motion both passively and active  Lymphadenopathy:     Cervical: No cervical adenopathy.  Skin:    General: Skin is warm and dry.  Neurological:     Mental Status: He is alert and oriented to person, place, and time.  Psychiatric:        Mood and Affect: Mood normal.    ED Results / Procedures / Treatments   Labs (all labs ordered are listed, but only abnormal results are displayed) Labs Reviewed - No data to display  EKG None  Radiology DG Hand Complete Left  Result Date: 09/06/2021 CLINICAL DATA:  Fall. EXAM: LEFT HAND - COMPLETE 3+ VIEW COMPARISON:  Left wrist radiograph dated 06/14/2021. FINDINGS: There is no acute fracture or dislocation. The bones are osteopenic. There is arthritic changes of the wrist with narrowing of the radiocarpal articulation. Chronic arthritic changes and deformity of the ulnar head. Mild soft tissue swelling of the wrist. IMPRESSION: 1. No acute fracture or dislocation. 2. Arthritic changes of the wrist similar to prior radiograph. Electronically Signed   By: Anner Crete M.D.   On: 09/06/2021 01:29    Procedures Procedures    Medications Ordered in ED Medications  HYDROcodone-acetaminophen (NORCO/VICODIN) 5-325 MG per tablet 1 tablet (1 tablet Oral Given 09/06/21 0118)    ED Course/ Medical Decision Making/ A&P                           Medical Decision Making  This patient presents to the ED for concern of hand and knee pain, this involves an extensive number of treatment options, and is a complaint that carries with it a high risk of complications and morbidity.  The differential diagnosis includes injury, gout, arthritis, less likely septic joint  MDM:    This a 51 year old male who  presents with left hand and left knee pain.  He does report some potential injury to the left hand but not to the knee.  He states his symptoms are consistent with his gout.  His physical exam is fairly benign.  No obvious deformities.  No overlying skin changes.  No indication of septic arthritis.  I did obtain an x-ray of the hand which does not show any evidence of fracture.  He does have some evidence of arthritis.  He was given a Norco.  He is taking his allopurinol but has run out of his colchicine.  Will redose. (Labs, imaging)  Labs: I Ordered, and personally interpreted labs.  The pertinent results include: None   Imaging Studies ordered: I ordered imaging studies including left hand negative I independently visualized and interpreted imaging. I agree with the radiologist interpretation  Critical Interventions: None   Consultations Obtained: I requested consultation with the none,  and discussed lab and imaging findings as well as pertinent plan - they recommend: None  Reevaluation: After the interventions noted above, I reevaluated the patient and found that they have :improved  Social Determinants of Health: N/A  Disposition: Home  Co morbidities that complicate the patient evaluation  Past Medical History:  Diagnosis Date   Gout    Hypertension    Muscle weakness    Stroke [facility])       Additional history obtained from none External records from outside source obtained and reviewed including none   Cardiac Monitoring: The patient was maintained on a cardiac monitor.  I personally viewed and interpreted the cardiac monitored which showed an underlying rhythm of: None   Medicines  Meds ordered this encounter  Medications   HYDROcodone-acetaminophen (NORCO/VICODIN) 5-325 MG per tablet 1 tablet   colchicine 0.6 MG tablet    Sig: Take 2 tablets by mouth and then a third tablet by mouth one hour later    Dispense:  3 tablet    Refill:  0     I have  reviewed the patients home medicines and have made adjustments as needed   Problem List / ED Course: Problem List Items Addressed This Visit   None Visit Diagnoses     Acute pain of left knee    -  Primary   Contusion of left hand, initial encounter                       Final Clinical Impression(s) / ED Diagnoses Final diagnoses:  Acute pain of left knee  Contusion of left hand, initial encounter    Rx / DC Orders ED Discharge Orders          Ordered    colchicine 0.6 MG tablet        09/06/21 0138              Merryl Hacker, MD 09/06/21 0140

## 2021-09-12 ENCOUNTER — Encounter: Payer: Medicare Other | Attending: Physical Medicine & Rehabilitation | Admitting: Physical Medicine & Rehabilitation

## 2021-09-12 ENCOUNTER — Other Ambulatory Visit: Payer: Self-pay

## 2021-09-12 ENCOUNTER — Encounter: Payer: Self-pay | Admitting: Physical Medicine & Rehabilitation

## 2021-09-12 VITALS — BP 121/78 | HR 90 | Temp 98.7°F | Ht 73.0 in | Wt 250.0 lb

## 2021-09-12 DIAGNOSIS — G8111 Spastic hemiplegia affecting right dominant side: Secondary | ICD-10-CM

## 2021-09-12 NOTE — Patient Instructions (Signed)

## 2021-09-12 NOTE — Progress Notes (Signed)
Xeomin Injection for spasticity using needle EMG guidance  Dilution: 50 Units/ml Indication: Severe spasticity which interferes with ADL,mobility and/or  hygiene and is unresponsive to medication management and other conservative care Informed consent was obtained after describing risks and benefits of the procedure with the patient. This includes bleeding, bruising, infection, excessive weakness, or medication side effects. A REMS form is on file and signed. Needle: 25g 2" needle electrode-  Number of units per muscle Botox FCR50 FCU50 FDS50 FDP50  PQ 50 PT 25 Waste 25 U All injections were done after obtaining appropriate EMG activity and after negative drawback for blood. The patient tolerated the procedure well. Post procedure instructions were given. A followup appointment was made.

## 2021-10-31 DIAGNOSIS — Z0001 Encounter for general adult medical examination with abnormal findings: Secondary | ICD-10-CM | POA: Diagnosis not present

## 2021-10-31 DIAGNOSIS — M1A09X0 Idiopathic chronic gout, multiple sites, without tophus (tophi): Secondary | ICD-10-CM | POA: Diagnosis not present

## 2021-10-31 DIAGNOSIS — I10 Essential (primary) hypertension: Secondary | ICD-10-CM | POA: Diagnosis not present

## 2021-10-31 DIAGNOSIS — R600 Localized edema: Secondary | ICD-10-CM | POA: Diagnosis not present

## 2021-10-31 DIAGNOSIS — E119 Type 2 diabetes mellitus without complications: Secondary | ICD-10-CM | POA: Diagnosis not present

## 2021-10-31 DIAGNOSIS — E7800 Pure hypercholesterolemia, unspecified: Secondary | ICD-10-CM | POA: Diagnosis not present
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
## 2021-12-11 DIAGNOSIS — K760 Fatty (change of) liver, not elsewhere classified: Secondary | ICD-10-CM | POA: Diagnosis not present

## 2021-12-11 DIAGNOSIS — R748 Abnormal levels of other serum enzymes: Secondary | ICD-10-CM | POA: Diagnosis not present

## 2021-12-11 DIAGNOSIS — K7401 Hepatic fibrosis, early fibrosis: Secondary | ICD-10-CM | POA: Diagnosis not present

## 2021-12-12 ENCOUNTER — Encounter: Payer: Medicare Other | Attending: Physical Medicine & Rehabilitation | Admitting: Physical Medicine & Rehabilitation

## 2021-12-12 ENCOUNTER — Encounter: Payer: Self-pay | Admitting: Physical Medicine & Rehabilitation

## 2021-12-12 VITALS — BP 124/77 | HR 84 | Ht 73.0 in | Wt 250.8 lb

## 2021-12-12 DIAGNOSIS — G8111 Spastic hemiplegia affecting right dominant side: Secondary | ICD-10-CM | POA: Insufficient documentation

## 2021-12-12 NOTE — Patient Instructions (Signed)
You received a Xeomin injection today. You may experience soreness at the needle injection sites. Please call us if any of the injection sites turns red after a couple days or if there is any drainage. You may experience muscle weakness as a result of Xeomin This would improve with time but can take several weeks to improve. The Xeomin should start working in about one week. The Xeomin usually last 3 months. The injection can be repeated every 3 months as needed.  

## 2021-12-12 NOTE — Progress Notes (Signed)
Xeomin Injection for spasticity using needle EMG guidance ? ?Dilution: 50 Units/ml ?Indication: Severe spasticity which interferes with ADL,mobility and/or  hygiene and is unresponsive to medication management and other conservative care ?Informed consent was obtained after describing risks and benefits of the procedure with the patient. This includes bleeding, bruising, infection, excessive weakness, or medication side effects. A REMS form is on file and signed. ?Needle: 25g 2" needle electrode-  ?Number of units per muscle ?Botox ?FCR50 ?FCU50 ?FDS50 ?FDP50 ? ?PQ 50 ?PT 50 ? ?All injections were done after obtaining appropriate EMG activity and after negative drawback for blood. The patient tolerated the procedure well. Post procedure instructions were given. A followup appointment was made.  ? ?Consider injecting PT only , not PQ ? ? ? ?

## 2021-12-14 ENCOUNTER — Ambulatory Visit (AMBULATORY_SURGERY_CENTER): Payer: Medicare Other

## 2021-12-14 VITALS — Ht 73.0 in | Wt 250.0 lb

## 2021-12-14 DIAGNOSIS — Z1211 Encounter for screening for malignant neoplasm of colon: Secondary | ICD-10-CM

## 2021-12-14 NOTE — Progress Notes (Signed)
No egg or soy allergy known to patient  ?No issues known to pt with past sedation with any surgeries or procedures ?Patient denies ever being told they had issues or difficulty with intubation  ?No FH of Malignant Hyperthermia ?Pt is not on diet pills ?Pt is not on home 02  ?Pt is not on blood thinners  ?Pt denies issues with constipation at this time;   ?No A fib or A flutter ?NO PA's for preps discussed with pt in PV today  ?Discussed with pt there will be an out-of-pocket cost for prep and that varies from $0 to 70 + dollars - pt verbalized understanding  ?Pt instructed to use Singlecare.com or GoodRx for a price reduction on prep  ?PV completed over the phone. Pt verified name, DOB, address and insurance during PV today.  ?Pt mailed instruction packet with copy of consent form to read and not return, and instructions.  ?Pt encouraged to call with questions or issues.  ? ? ?

## 2021-12-21 DIAGNOSIS — E119 Type 2 diabetes mellitus without complications: Secondary | ICD-10-CM | POA: Diagnosis not present

## 2021-12-21 DIAGNOSIS — R600 Localized edema: Secondary | ICD-10-CM | POA: Diagnosis not present

## 2021-12-21 DIAGNOSIS — E7800 Pure hypercholesterolemia, unspecified: Secondary | ICD-10-CM | POA: Diagnosis not present

## 2021-12-21 DIAGNOSIS — I10 Essential (primary) hypertension: Secondary | ICD-10-CM | POA: Diagnosis not present

## 2021-12-21 DIAGNOSIS — M1A09X0 Idiopathic chronic gout, multiple sites, without tophus (tophi): Secondary | ICD-10-CM | POA: Diagnosis not present

## 2021-12-25 ENCOUNTER — Telehealth: Payer: Self-pay | Admitting: Gastroenterology

## 2021-12-25 NOTE — Telephone Encounter (Signed)
Patient called to reschedule procedure scheduled 12/27/21 to 12/29/21. Per patient, never received prep instructions in the mail. Patient will come pick up his prep instructions in person today.  ?

## 2021-12-27 ENCOUNTER — Encounter: Payer: Medicare Other | Admitting: Gastroenterology

## 2021-12-27 NOTE — Telephone Encounter (Signed)
PRINTED 5-5 INSTRUCTIONS - PT PICKED UP  ?

## 2021-12-29 ENCOUNTER — Ambulatory Visit (AMBULATORY_SURGERY_CENTER): Payer: Medicare Other | Admitting: Gastroenterology

## 2021-12-29 ENCOUNTER — Encounter: Payer: Self-pay | Admitting: Gastroenterology

## 2021-12-29 VITALS — BP 115/74 | HR 95 | Temp 98.0°F | Resp 18 | Ht 73.0 in | Wt 250.0 lb

## 2021-12-29 DIAGNOSIS — Z538 Procedure and treatment not carried out for other reasons: Secondary | ICD-10-CM | POA: Diagnosis not present

## 2021-12-29 DIAGNOSIS — Z1211 Encounter for screening for malignant neoplasm of colon: Secondary | ICD-10-CM | POA: Diagnosis not present

## 2021-12-29 MED ORDER — SODIUM CHLORIDE 0.9 % IV SOLN: 500.0000 mL | Freq: Once | INTRAVENOUS | Status: DC

## 2021-12-29 NOTE — Progress Notes (Signed)
1415 Ephedrine 10 mg given IV due to low BP, MD updated.  

## 2021-12-29 NOTE — Progress Notes (Signed)
? ?GASTROENTEROLOGY PROCEDURE H&P NOTE  ? ?Primary Care Physician: ?Benito Mccreedy, MD ? ? ? ?Reason for Procedure:  Colon Cancer screening ? ?Plan:    Colonoscopy ? ?Patient is appropriate for endoscopic procedure(s) in the ambulatory ([facility]) setting. ? ?The nature of the procedure, as well as the risks, benefits, and alternatives were carefully and thoroughly reviewed with the patient. Ample time for discussion and questions allowed. The patient understood, was satisfied, and agreed to proceed.  ? ? ? ?HPI: ?Randall Mccall is a 51 y.o. male who presents for colonoscopy for routine Colon Cancer screening.  No active GI symptoms.  No known family history of colon cancer or related malignancy.  Patient is otherwise without complaints or active issues today. ? ?Past Medical History:  ?Diagnosis Date  ? Gout   ? Hyperlipidemia   ? on meds  ? Hypertension   ? on meds  ? Muscle weakness   ? Stroke [facility]) 2018  ? right sided stroke  ? ? ?Past Surgical History:  ?Procedure Laterality Date  ? KNEE SURGERY Right   ? ? ?Prior to Admission medications   ?Medication Sig Start Date End Date Taking? Authorizing Provider  ?allopurinol (ZYLOPRIM) 100 MG tablet Take 100 mg by mouth daily. 12/19/20  Yes [provider]  ?amLODipine-benazepril (LOTREL) 10-20 MG capsule Take 1 capsule by mouth daily. 12/15/20  Yes [provider]  ?aspirin 325 MG tablet Take 1 tablet (325 mg total) by mouth daily. 09/18/16  Yes Nita Sells, MD  ?atorvastatin (LIPITOR) 80 MG tablet Take 1 tablet (80 mg total) by mouth daily at 6 PM. 09/17/16  Yes Nita Sells, MD  ?Cholecalciferol (VITAMIN D3) 5000 units TABS Take 1 tablet by mouth daily. 09/25/17  Yes [provider]  ?cloNIDine (CATAPRES) 0.1 MG tablet Take 1 tablet (0.1 mg total) by mouth 2 (two) times daily. 06/15/16  Yes Elnora Morrison, MD  ?colchicine 0.6 MG tablet Take 2 tablets by mouth and then a third tablet by mouth one hour later 09/06/21  Yes Horton,  Barbette Hair, MD  ?diphenhydrAMINE (BENADRYL) 25 MG tablet Take 1 tablet (25 mg total) by mouth every 6 (six) hours as needed for itching (rash). 12/01/20  Yes Petrucelli, Samantha R, PA-C  ?ezetimibe (ZETIA) 10 MG tablet Take 10 mg by mouth daily. 01/24/20  Yes [provider]  ?famotidine (PEPCID) 20 MG tablet Take 1 tablet (20 mg total) by mouth 2 (two) times daily as needed (rash/itching.). 12/01/20  Yes Petrucelli, Samantha R, PA-C  ?febuxostat (ULORIC) 40 MG tablet Take 40 mg by mouth daily. 03/22/20  Yes [provider]  ?furosemide (LASIX) 20 MG tablet Take 20-40 mg by mouth daily. 02/08/20  Yes [provider]  ?indomethacin (INDOCIN) 50 MG capsule Take 1 capsule (50 mg total) by mouth 2 (two) times daily. 01/15/21  Yes Tacy Learn, PA-C  ?gabapentin (NEURONTIN) 300 MG capsule Take 1 capsule (300 mg total) by mouth 2 (two) times daily for 15 days. 06/13/21 12/14/21  Carmin Muskrat, MD  ? ? ?Current Outpatient Medications  ?Medication Sig Dispense Refill  ? allopurinol (ZYLOPRIM) 100 MG tablet Take 100 mg by mouth daily.    ? amLODipine-benazepril (LOTREL) 10-20 MG capsule Take 1 capsule by mouth daily.    ? aspirin 325 MG tablet Take 1 tablet (325 mg total) by mouth daily.    ? atorvastatin (LIPITOR) 80 MG tablet Take 1 tablet (80 mg total) by mouth daily at 6 PM.    ? Cholecalciferol (  VITAMIN D3) 5000 units TABS Take 1 tablet by mouth daily.  0  ? cloNIDine (CATAPRES) 0.1 MG tablet Take 1 tablet (0.1 mg total) by mouth 2 (two) times daily. 20 tablet 0  ? colchicine 0.6 MG tablet Take 2 tablets by mouth and then a third tablet by mouth one hour later 3 tablet 0  ? diphenhydrAMINE (BENADRYL) 25 MG tablet Take 1 tablet (25 mg total) by mouth every 6 (six) hours as needed for itching (rash). 30 tablet 0  ? ezetimibe (ZETIA) 10 MG tablet Take 10 mg by mouth daily.    ? famotidine (PEPCID) 20 MG tablet Take 1 tablet (20 mg total) by mouth 2 (two) times daily as needed (rash/itching.). 10  tablet 0  ? febuxostat (ULORIC) 40 MG tablet Take 40 mg by mouth daily.    ? furosemide (LASIX) 20 MG tablet Take 20-40 mg by mouth daily.    ? indomethacin (INDOCIN) 50 MG capsule Take 1 capsule (50 mg total) by mouth 2 (two) times daily. 20 capsule 0  ? gabapentin (NEURONTIN) 300 MG capsule Take 1 capsule (300 mg total) by mouth 2 (two) times daily for 15 days. 30 capsule 0  ? ?Current Facility-Administered Medications  ?Medication Dose Route Frequency Provider Last Rate Last Admin  ? 0.9 %  sodium chloride infusion  500 mL Intravenous Once Tayjon Halladay V, DO      ? ? ?Allergies as of 12/29/2021  ? (No Known Allergies)  ? ? ?Family History  ?Problem Relation Age of Onset  ? Hypertension Mother   ? Cirrhosis Father   ? Colon polyps Neg Hx   ? Colon cancer Neg Hx   ? Esophageal cancer Neg Hx   ? Stomach cancer Neg Hx   ? Rectal cancer Neg Hx   ? ? ?Social History  ? ?Socioeconomic History  ? Marital status: Single  ?  Spouse name: Not on file  ? Number of children: 0  ? Years of education: 12  ? Highest education level: Not on file  ?Occupational History  ?  Comment: Disabled  ?Tobacco Use  ? Smoking status: Never  ? Smokeless tobacco: Never  ?Vaping Use  ? Vaping Use: Never used  ?Substance and Sexual Activity  ? Alcohol use: Not Currently  ?  Alcohol/week: 0.0 - 4.0 standard drinks  ?  Comment: occ  ? Drug use: No  ? Sexual activity: Not on file  ?Other Topics Concern  ? Not on file  ?Social History Narrative  ? Patient lives alone and he is single.  ? Right handed.  ? Caffeine-   ? Education- 12 th grade  ? Disabled.  ? ?Social Determinants of Health  ? ?Financial Resource Strain: Not on file  ?Food Insecurity: Not on file  ?Transportation Needs: Not on file  ?Physical Activity: Not on file  ?Stress: Not on file  ?Social Connections: Not on file  ?Intimate Partner Violence: Not on file  ? ? ?Physical Exam: ?Vital signs in last 24 hours: ?'@BP'$  126/84   Pulse 86   Temp 98 ?F (36.7 ?C)   Ht '6\' 1"'$  (1.854 m)    Wt 250 lb (113.4 kg)   SpO2 97%   BMI 32.98 kg/m?  ?GEN: NAD ?EYE: Sclerae anicteric ?ENT: MMM ?CV: Non-tachycardic ?Pulm: CTA b/l ?GI: Soft, NT/ND ?NEURO:  Alert & Oriented x 3 ? ? ?Gerrit Heck, DO ?[facility] ? ? ?12/29/2021 1:59 PM ? ?

## 2021-12-29 NOTE — Patient Instructions (Signed)
Repeat colonoscopy in 6 months for screening purposes with  2 day prep  ? ?YOU HAD AN ENDOSCOPIC PROCEDURE TODAY AT [facility]:   Refer to the procedure report that was given to you for any specific questions about what was found during the examination.  If the procedure report does not answer your questions, please call your gastroenterologist to clarify.  If you requested that your care partner not be given the details of your procedure findings, then the procedure report has been included in a sealed envelope for you to review at your convenience later. ? ?YOU SHOULD EXPECT: Some feelings of bloating in the abdomen. Passage of more gas than usual.  Walking can help get rid of the air that was put into your GI tract during the procedure and reduce the bloating. If you had a lower endoscopy (such as a colonoscopy or flexible sigmoidoscopy) you may notice spotting of blood in your stool or on the toilet paper. If you underwent a bowel prep for your procedure, you may not have a normal bowel movement for a few days. ? ?Please Note:  You might notice some irritation and congestion in your nose or some drainage.  This is from the oxygen used during your procedure.  There is no need for concern and it should clear up in a day or so. ? ?SYMPTOMS TO REPORT IMMEDIATELY: ? ?Following lower endoscopy (colonoscopy or flexible sigmoidoscopy): ? Excessive amounts of blood in the stool ? Significant tenderness or worsening of abdominal pains ? Swelling of the abdomen that is new, acute ? Fever of 100?F or higher ? ?For urgent or emergent issues, a gastroenterologist can be reached at any hour by calling [phone]. ?Do not use MyChart messaging for urgent concerns.  ? ? ?DIET:  We do recommend a small meal at first, but then you may proceed to your regular diet.  Drink plenty of fluids but you should avoid alcoholic beverages for 24 hours. ? ?ACTIVITY:  You should plan to take it easy for the rest of today  and you should NOT DRIVE or use heavy machinery until tomorrow (because of the sedation medicines used during the test).   ? ?FOLLOW UP: ?Our staff will call the number listed on your records 48-72 hours following your procedure to check on you and address any questions or concerns that you may have regarding the information given to you following your procedure. If we do not reach you, we will leave a message.  We will attempt to reach you two times.  During this call, we will ask if you have developed any symptoms of COVID 19. If you develop any symptoms (ie: fever, flu-like symptoms, shortness of breath, cough etc.) before then, please call [phone].  If you test positive for Covid 19 in the 2 weeks post procedure, please call and report this information to [place].   ? ?If any biopsies were taken you will be contacted by phone or by letter within the next 1-3 weeks.  Please call us at [phone] if you have not heard about the biopsies in 3 weeks.  ? ? ?SIGNATURES/CONFIDENTIALITY: ?You and/or your care partner have signed paperwork which will be entered into your electronic medical record.  These signatures attest to the fact that that the information above on your After Visit Summary has been reviewed and is understood.  Full responsibility of the confidentiality of this discharge information lies with you and/or your care-partner. ? ?

## 2021-12-29 NOTE — Progress Notes (Signed)
1422 Patient experiencing nausea and vomiting.  MD updated and Zofran 4 mg IV given, vss  ?

## 2021-12-29 NOTE — Progress Notes (Signed)
1430 Ephedrine 10 mg given IV due to low BP, MD updated.  

## 2021-12-29 NOTE — Progress Notes (Signed)
1420 Robinul 0.2 mg IV given due large amount of secretions upon assessment.  MD made aware, vss  ?

## 2021-12-29 NOTE — Progress Notes (Signed)
Pt's states no medical or surgical changes since previsit or office visit. 

## 2021-12-29 NOTE — Op Note (Signed)
[facility] ?Patient Name: Randall Mccall ?Procedure Date: 12/29/2021 1:51 PM ?MRN: [id number] ?Endoscopist: Gerrit Heck , MD ?Age: 51 ?Referring MD:  ?Date of Birth: 01-15-1971 ?Gender: Male ?Account #: [id number] ?Procedure:                Colonoscopy ?Indications:              Screening for colorectal malignant neoplasm, This  ?                          is the patient's first colonoscopy ?Medicines:                Monitored Anesthesia Care ?Procedure:                Pre-Anesthesia Assessment: ?                          - Prior to the procedure, a History and Physical  ?                          was performed, and patient medications and  ?                          allergies were reviewed. The patient's tolerance of  ?                          previous anesthesia was also reviewed. The risks  ?                          and benefits of the procedure and the sedation  ?                          options and risks were discussed with the patient.  ?                          All questions were answered, and informed consent  ?                          was obtained. Prior Anticoagulants: The patient has  ?                          taken no previous anticoagulant or antiplatelet  ?                          agents. ASA Grade Assessment: II - A patient with  ?                          mild systemic disease. After reviewing the risks  ?                          and benefits, the patient was deemed in  ?                          satisfactory condition to undergo the procedure. ?  After obtaining informed consent, the colonoscope  ?                          was passed under direct vision. Throughout the  ?                          procedure, the patient's blood pressure, pulse, and  ?                          oxygen saturations were monitored continuously. The  ?                          CF HQ190L #[id number] was introduced through the anus  ?                          with the intention of  advancing to the cecum. The  ?                          scope was advanced to the transverse colon before  ?                          the procedure was aborted. Medications were given.  ?                          The colonoscopy was technically difficult and  ?                          complex due to inadequate bowel prep and  ?                          significant looping. Attempted to place abdominal  ?                          pressure to facilitate colonoscope movement, but  ?                          the patient developed emesis. This was quickly  ?                          identified and suctioned. Unfortunately, the  ?                          combination of significant looping and inadequate  ?                          prep precluded adquate and safe visualization, and  ?                          the procedure was aborted. The patient otherwise  ?                          tolerated the procedure fairly well. The quality of  ?  the bowel preparation was fair. The rectum was  ?                          photographed. ?Scope In: 2:13:00 PM ?Scope Out: 2:27:03 PM ?Total Procedure Duration: 0 hours 14 minutes 3 seconds  ?Findings:                 The perianal and digital rectal examinations were  ?                          normal. ?                          A moderate amount of solid stool and liquid stool  ?                          debris was found in the rectum, in the  ?                          recto-sigmoid colon and in the sigmoid colon,  ?                          interfering with visualization. Lavage of the area  ?                          was performed using copious amounts of tap water,  ?                          resulting in incomplete clearance with fair  ?                          visualization. The colonoscope clogged several  ?                          times with solid food debris, which precluded  ?                          adequate clearance and visualization of these  areas. ?                          The sigmoid colon and transverse colon revealed  ?                          significantly excessive looping. Advancing the  ?                          scope required brief use of manual pressure to  ?                          faciliate forward movement of the colonoscope. Due  ?                          to emesis during the procedure, the decision was  ?  made to not apply any further pressure. The  ?                          combination of significant looping, inadequate  ?                          prep, and patient tolerance resulted in an  ?                          incomplete exam. ?                          The retroflexed view of the distal rectum and anal  ?                          verge was normal and showed no anal or rectal  ?                          abnormalities. ?Complications:            Emesis without need for advanced airway support.  ?                          Otherwise, no immediate complications. ?Estimated Blood Loss:     Estimated blood loss: none. ?Impression:               - Preparation of the colon was fair with  ?                          semi-liquid stool and solid food debris in the  ?                          rectum, in the recto-sigmoid colon, and in the  ?                          sigmoid colon. ?                          - There was significant looping of the colon. ?                          - The distal rectum and anal verge are normal on  ?                          retroflexion view. ?                          - No specimens collected. ?Recommendation:           - Patient has a contact number available for  ?                          emergencies. The signs and symptoms of potential  ?                          delayed complications were discussed with the  ?  patient. Return to normal activities tomorrow.  ?                          Written discharge instructions were provided to the  ?                           patient. ?                          - Resume previous diet. ?                          - Continue present medications. ?                          - Repeat colonoscopy in 6 months for screening  ?                          purposes. Plan for abdominal binder placement prior  ?                          to procedure and extended 2-day bowel preparation.  ?                          Alternatively, can pursue virtual colonoscopy if  ?                          preferred by the patient to complete colon cancer  ?                          screening. ?                          - Return to GI office PRN. ?Gerrit Heck, MD ?12/29/2021 2:40:03 PM ?

## 2021-12-29 NOTE — Progress Notes (Signed)
Report given to PACU, vss 

## 2021-12-29 NOTE — Progress Notes (Signed)
1427 Albuterol given for wheezing and scope stopped due to poor prep. vss ?

## 2022-01-02 ENCOUNTER — Telehealth: Payer: Self-pay | Admitting: *Deleted

## 2022-01-02 NOTE — Telephone Encounter (Signed)
?  Follow up Call- ? ? ?  12/29/2021  ?  1:34 PM  ?Call back number  ?Post procedure Call Back phone  # [phone]  ?Permission to leave phone message Yes  ?  ? ?Patient questions: ? ?Do you have a fever, pain , or abdominal swelling? No. ?Pain Score  0 * ? ?Have you tolerated food without any problems? Yes.   ? ?Have you been able to return to your normal activities? Yes.   ? ?Do you have any questions about your discharge instructions: ?Diet   No. ?Medications  No. ?Follow up visit  No. ? ?Do you have questions or concerns about your Care? No. ? ?Actions: ?* If pain score is 4 or above: ?No action needed, pain <4. ? ? ?

## 2022-01-03 DIAGNOSIS — E785 Hyperlipidemia, unspecified: Secondary | ICD-10-CM | POA: Diagnosis not present

## 2022-01-03 DIAGNOSIS — I672 Cerebral atherosclerosis: Secondary | ICD-10-CM | POA: Diagnosis not present

## 2022-01-03 DIAGNOSIS — I69351 Hemiplegia and hemiparesis following cerebral infarction affecting right dominant side: Secondary | ICD-10-CM | POA: Diagnosis not present

## 2022-01-03 DIAGNOSIS — I10 Essential (primary) hypertension: Secondary | ICD-10-CM | POA: Diagnosis not present

## 2022-01-04 ENCOUNTER — Other Ambulatory Visit (HOSPITAL_COMMUNITY): Payer: Self-pay | Admitting: Neurology

## 2022-01-04 ENCOUNTER — Other Ambulatory Visit (HOSPITAL_BASED_OUTPATIENT_CLINIC_OR_DEPARTMENT_OTHER): Payer: Self-pay | Admitting: Neurology

## 2022-01-04 ENCOUNTER — Other Ambulatory Visit: Payer: Self-pay | Admitting: Neurology

## 2022-01-04 DIAGNOSIS — I69351 Hemiplegia and hemiparesis following cerebral infarction affecting right dominant side: Secondary | ICD-10-CM

## 2022-01-23 ENCOUNTER — Ambulatory Visit (HOSPITAL_COMMUNITY)
Admission: RE | Admit: 2022-01-23 | Discharge: 2022-01-23 | Disposition: A | Discharge status: Home / Self Care | Payer: Medicare Other | Source: Ambulatory Visit | Attending: Neurology | Admitting: Neurology

## 2022-01-23 DIAGNOSIS — I6782 Cerebral ischemia: Secondary | ICD-10-CM | POA: Diagnosis not present

## 2022-01-23 DIAGNOSIS — I6381 Other cerebral infarction due to occlusion or stenosis of small artery: Secondary | ICD-10-CM | POA: Diagnosis not present

## 2022-01-23 DIAGNOSIS — I69351 Hemiplegia and hemiparesis following cerebral infarction affecting right dominant side: Secondary | ICD-10-CM | POA: Diagnosis not present

## 2022-03-01 DIAGNOSIS — I672 Cerebral atherosclerosis: Secondary | ICD-10-CM | POA: Diagnosis not present

## 2022-03-01 DIAGNOSIS — I10 Essential (primary) hypertension: Secondary | ICD-10-CM | POA: Diagnosis not present

## 2022-03-01 DIAGNOSIS — E785 Hyperlipidemia, unspecified: Secondary | ICD-10-CM | POA: Diagnosis not present

## 2022-03-01 DIAGNOSIS — I69351 Hemiplegia and hemiparesis following cerebral infarction affecting right dominant side: Secondary | ICD-10-CM | POA: Diagnosis not present

## 2022-03-13 ENCOUNTER — Encounter: Payer: Medicare Other | Attending: Physical Medicine & Rehabilitation | Admitting: Physical Medicine & Rehabilitation

## 2022-03-13 ENCOUNTER — Encounter: Payer: Self-pay | Admitting: Physical Medicine & Rehabilitation

## 2022-03-13 VITALS — BP 114/77 | HR 79 | Temp 97.6°F | Ht 73.0 in | Wt 243.0 lb

## 2022-03-13 DIAGNOSIS — G8111 Spastic hemiplegia affecting right dominant side: Secondary | ICD-10-CM | POA: Diagnosis not present

## 2022-03-13 NOTE — Progress Notes (Signed)
Xeomin Injection for spasticity using needle EMG guidance  Dilution: 50 Units/ml Indication: Severe spasticity which interferes with ADL,mobility and/or  hygiene and is unresponsive to medication management and other conservative care Informed consent was obtained after describing risks and benefits of the procedure with the patient. This includes bleeding, bruising, infection, excessive weakness, or medication side effects. A REMS form is on file and signed. Needle: 25g 2" needle electrode-  Number of units per muscle Botox FCR50 FCU50 FDS50 FDP50 Biceps 50  PT 50  All injections were done after obtaining appropriate EMG activity and after negative drawback for blood. The patient tolerated the procedure well. Post procedure instructions were given. A followup appointment was made.       

## 2022-03-13 NOTE — Patient Instructions (Signed)

## 2022-03-14 DIAGNOSIS — E119 Type 2 diabetes mellitus without complications: Secondary | ICD-10-CM | POA: Diagnosis not present

## 2022-03-14 DIAGNOSIS — R600 Localized edema: Secondary | ICD-10-CM | POA: Diagnosis not present

## 2022-03-14 DIAGNOSIS — M1A09X0 Idiopathic chronic gout, multiple sites, without tophus (tophi): Secondary | ICD-10-CM | POA: Diagnosis not present

## 2022-03-14 DIAGNOSIS — E7800 Pure hypercholesterolemia, unspecified: Secondary | ICD-10-CM | POA: Diagnosis not present

## 2022-03-14 DIAGNOSIS — Z0001 Encounter for general adult medical examination with abnormal findings: Secondary | ICD-10-CM | POA: Diagnosis not present

## 2022-03-14 DIAGNOSIS — I10 Essential (primary) hypertension: Secondary | ICD-10-CM | POA: Diagnosis not present

## 2022-04-11 DIAGNOSIS — I10 Essential (primary) hypertension: Secondary | ICD-10-CM | POA: Diagnosis not present

## 2022-04-11 DIAGNOSIS — E7800 Pure hypercholesterolemia, unspecified: Secondary | ICD-10-CM | POA: Diagnosis not present

## 2022-04-11 DIAGNOSIS — E119 Type 2 diabetes mellitus without complications: Secondary | ICD-10-CM | POA: Diagnosis not present

## 2022-04-11 DIAGNOSIS — M1A09X0 Idiopathic chronic gout, multiple sites, without tophus (tophi): Secondary | ICD-10-CM | POA: Diagnosis not present

## 2022-04-24 ENCOUNTER — Encounter: Payer: Self-pay | Admitting: Gastroenterology

## 2022-05-03 ENCOUNTER — Ambulatory Visit (AMBULATORY_SURGERY_CENTER): Payer: Self-pay | Admitting: *Deleted

## 2022-05-03 VITALS — Ht 73.0 in | Wt 240.0 lb

## 2022-05-03 DIAGNOSIS — Z1211 Encounter for screening for malignant neoplasm of colon: Secondary | ICD-10-CM

## 2022-05-03 MED ORDER — PEG 3350-KCL-NA BICARB-NACL 420 G PO SOLR: 4000.0000 mL | Freq: Once | ORAL | 0 refills | Status: AC

## 2022-05-03 NOTE — Progress Notes (Signed)
No egg or soy allergy known to patient  No issues known to pt with past sedation with any surgeries or procedures Patient denies ever being told they had issues or difficulty with intubation  No FH of Malignant Hyperthermia Pt is not on diet pills Pt is not on  home 02  Pt is not on blood thinners  Pt denies issues with constipation  No A fib or A flutter Have any cardiac testing pending--NO Pt instructed to use Single[employer] or GoodRx for a price reduction on prep    Sample sheet of over the counter items to purchase for 2 day prep mailed with packet.

## 2022-05-17 ENCOUNTER — Encounter: Payer: Self-pay | Admitting: Certified Registered Nurse Anesthetist

## 2022-05-25 ENCOUNTER — Ambulatory Visit (AMBULATORY_SURGERY_CENTER): Payer: Medicare Other | Admitting: Gastroenterology

## 2022-05-25 ENCOUNTER — Encounter: Payer: Self-pay | Admitting: Gastroenterology

## 2022-05-25 VITALS — BP 133/82 | HR 59 | Temp 97.5°F | Resp 17 | Ht 73.0 in | Wt 249.0 lb

## 2022-05-25 DIAGNOSIS — K5730 Diverticulosis of large intestine without perforation or abscess without bleeding: Secondary | ICD-10-CM

## 2022-05-25 DIAGNOSIS — Z1211 Encounter for screening for malignant neoplasm of colon: Secondary | ICD-10-CM | POA: Diagnosis not present

## 2022-05-25 DIAGNOSIS — K640 First degree hemorrhoids: Secondary | ICD-10-CM

## 2022-05-25 MED ORDER — SODIUM CHLORIDE 0.9 % IV SOLN: 500.0000 mL | Freq: Once | INTRAVENOUS | Status: DC

## 2022-05-25 NOTE — Progress Notes (Signed)
Pt's states no medical or surgical changes since previsit or office visit. 

## 2022-05-25 NOTE — Progress Notes (Signed)
1315 Pt extremely sleepy, denies any drug use.  MD made aware. vss

## 2022-05-25 NOTE — Progress Notes (Signed)
Late entry: 1340 Patient experiencing nausea and vomiting.  MD updated and Zofran 4 mg IV given, vss

## 2022-05-25 NOTE — Progress Notes (Signed)
Patient insists that he doesn't need help dressing although he has deficits from his stroke.

## 2022-05-25 NOTE — Patient Instructions (Addendum)
Read all of the handouts given to you by your recovery room nurse.  YOU HAD AN ENDOSCOPIC PROCEDURE TODAY AT [facility]:   Refer to the procedure report that was given to you for any specific questions about what was found during the examination.  If the procedure report does not answer your questions, please call your gastroenterologist to clarify.  If you requested that your care partner not be given the details of your procedure findings, then the procedure report has been included in a sealed envelope for you to review at your convenience later.  YOU SHOULD EXPECT: Some feelings of bloating in the abdomen. Passage of more gas than usual.  Walking can help get rid of the air that was put into your GI tract during the procedure and reduce the bloating. If you had a lower endoscopy (such as a colonoscopy or flexible sigmoidoscopy) you may notice spotting of blood in your stool or on the toilet paper. If you underwent a bowel prep for your procedure, you may not have a normal bowel movement for a few days.  Please Note:  You might notice some irritation and congestion in your nose or some drainage.  This is from the oxygen used during your procedure.  There is no need for concern and it should clear up in a day or so.  SYMPTOMS TO REPORT IMMEDIATELY:  Following lower endoscopy (colonoscopy or flexible sigmoidoscopy):  Excessive amounts of blood in the stool  Significant tenderness or worsening of abdominal pains  Swelling of the abdomen that is new, acute  Fever of 100F or higher   For urgent or emergent issues, a gastroenterologist can be reached at any hour by calling [phone]. Do not use MyChart messaging for urgent concerns.    DIET:  We do recommend a small meal at first, but then you may proceed to your regular diet.  Drink plenty of fluids but you should avoid alcoholic beverages for 24 hours.  ACTIVITY:  You should plan to take it easy for the rest of today and  you should NOT DRIVE or use heavy machinery until tomorrow (because of the sedation medicines used during the test).    FOLLOW UP: Our staff will call the number listed on your records the next business day following your procedure.  We will call around 7:15- 8:00 am to check on you and address any questions or concerns that you may have regarding the information given to you following your procedure. If we do not reach you, we will leave a message.       SIGNATURES/CONFIDENTIALITY: You and/or your care partner have signed paperwork which will be entered into your electronic medical record.  These signatures attest to the fact that that the information above on your After Visit Summary has been reviewed and is understood.  Full responsibility of the confidentiality of this discharge information lies with you and/or your care-partner.

## 2022-05-25 NOTE — Op Note (Signed)
[place] Patient Name: Randall Mccall Procedure Date: 05/25/2022 1:18 PM MRN: [id number] Endoscopist: Gerrit Heck , MD Age: 51 Referring MD:  Date of Birth: Aug 27, 1971 Gender: Male Account #: [id number] Procedure:                Colonoscopy Indications:              Screening for colorectal malignant neoplasm                           Initial screening colonoscopy performed on 12/29/2021                            and notable for fair prep, significant looping,                            resulting in early termination of the procedure. He                            presents today for repeat attempt at screening                            colonoscopy after completing extended 2-day bowel                            preparation. Medicines:                Monitored Anesthesia Care Procedure:                Pre-Anesthesia Assessment:                           - Prior to the procedure, a History and Physical                            was performed, and patient medications and                            allergies were reviewed. The patient's tolerance of                            previous anesthesia was also reviewed. The risks                            and benefits of the procedure and the sedation                            options and risks were discussed with the patient.                            All questions were answered, and informed consent                            was obtained. Prior Anticoagulants: The patient has  taken no previous anticoagulant or antiplatelet                            agents. ASA Grade Assessment: III - A patient with                            severe systemic disease. After reviewing the risks                            and benefits, the patient was deemed in                            satisfactory condition to undergo the procedure.                           After obtaining informed consent, the colonoscope                             was passed under direct vision. Throughout the                            procedure, the patient's blood pressure, pulse, and                            oxygen saturations were monitored continuously. The                            Olympus CF-HQ190L ([id number]) Colonoscope was                            introduced through the anus and advanced to the the                            cecum, identified by appendiceal orifice and                            ileocecal valve. The colonoscopy was technically                            difficult and complex due to significant looping.                            Successful completion of the procedure was aided by                            applying abdominal pressure adn abdominal binder.                            The patient tolerated the procedure well. The                            quality of the bowel preparation was good. The  ileocecal valve, appendiceal orifice, and rectum                            were photographed. Scope In: 1:34:44 PM Scope Out: 1:58:15 PM Scope Withdrawal Time: 0 hours 10 minutes 47 seconds  Total Procedure Duration: 0 hours 23 minutes 31 seconds  Findings:                 The perianal and digital rectal examinations were                            normal.                           The sigmoid colon, hepatic flexure and ascending                            colon revealed significantly excessive looping. An                            abdominal binder was placed prior to starting the                            procedure, and advancing the scope required using                            manual pressure and a series of colonoscope                            advancements and reductions.                           A single small-mouthed diverticulum was found in                            the descending colon.                           A moderate amount of stool was found in the  entire                            colon. Lavage of the area was performed using                            copious amounts of tap water, resulting in                            clearance with good visualization. The colon was                            otherwise normal appearing.                           Non-bleeding internal hemorrhoids were found during  retroflexion. The hemorrhoids were small. Complications:            No immediate complications. Estimated Blood Loss:     Estimated blood loss: none. Impression:               - There was significant looping of the colon.                           - Single small diverticula in the descending colon.                           - Stool in the entire examined colon.                           - Non-bleeding internal hemorrhoids.                           - No specimens collected. Recommendation:           - Patient has a contact number available for                            emergencies. The signs and symptoms of potential                            delayed complications were discussed with the                            patient. Return to normal activities tomorrow.                            Written discharge instructions were provided to the                            patient.                           - Resume previous diet.                           - Continue present medications.                           - Repeat colonoscopy in 10 years for screening                            purposes.                           - Return to GI clinic PRN. Gerrit Heck, MD 05/25/2022 2:05:42 PM

## 2022-05-25 NOTE — Progress Notes (Signed)
Report given to PACU, vss 

## 2022-05-25 NOTE — Progress Notes (Signed)
GASTROENTEROLOGY PROCEDURE H&P NOTE   Primary Care Physician: Benito Mccreedy, MD    Reason for Procedure:  Colon Cancer screening  Plan:    Colonoscopy  Patient is appropriate for endoscopic procedure(s) in the ambulatory ([place]) setting.  The nature of the procedure, as well as the risks, benefits, and alternatives were carefully and thoroughly reviewed with the patient. Ample time for discussion and questions allowed. The patient understood, was satisfied, and agreed to proceed.     HPI: Randall Mccall is a 51 y.o. male who presents for colonoscopy for routine Colon Cancer screening.  No active GI symptoms.  No known family history of colon cancer or related malignancy.    Initial screening colonoscopy performed on 12/29/2021 and notable for fair prep, significant looping, resulting in early termination of the procedure.  He presents today for repeat attempt at screening colonoscopy after completing extended 2-day bowel preparation.  Past Medical History:  Diagnosis Date   Allergy    Gout    Hyperlipidemia    on meds   Hypertension    on meds   Muscle weakness    RIGHT   Stroke [facility]) 2018   right sided stroke    Past Surgical History:  Procedure Laterality Date   COLONOSCOPY     12/29/2021   KNEE SURGERY Right     Prior to Admission medications   Medication Sig Start Date End Date Taking? Authorizing Provider  allopurinol (ZYLOPRIM) 100 MG tablet Take 100 mg by mouth daily. 12/19/20  Yes [provider]  amLODipine-benazepril (LOTREL) 10-20 MG capsule Take 1 capsule by mouth daily. 12/15/20  Yes [provider]  aspirin 325 MG tablet Take 1 tablet (325 mg total) by mouth daily. 09/18/16  Yes Nita Sells, MD  atorvastatin (LIPITOR) 80 MG tablet 1 tab(s) orally once a day for 90 days   Yes [provider]  Cholecalciferol (VITAMIN D3) 5000 units TABS Take 1 tablet by mouth daily. 09/25/17  Yes [provider]  colchicine  0.6 MG tablet Take 2 tablets by mouth and then a third tablet by mouth one hour later 09/06/21  Yes Horton, Barbette Hair, MD  ezetimibe (ZETIA) 10 MG tablet 1 tab(s) orally once a day for 90 days   Yes [provider]  febuxostat (ULORIC) 40 MG tablet Take 40 mg by mouth daily. 03/22/20  Yes [provider]  furosemide (LASIX) 20 MG tablet Take 20-40 mg by mouth daily. 02/08/20  Yes [provider]  gabapentin (NEURONTIN) 300 MG capsule 1 cap(s) orally 3 times a day for 90 days   Yes [provider]  indomethacin (INDOCIN) 50 MG capsule Take 1 capsule (50 mg total) by mouth 2 (two) times daily. 01/15/21  Yes Tacy Learn, PA-C  cloNIDine (CATAPRES) 0.1 MG tablet Take 1 tablet (0.1 mg total) by mouth 2 (two) times daily. Patient not taking: Reported on 05/03/2022 06/15/16   Elnora Morrison, MD  diphenhydrAMINE (BENADRYL) 25 MG tablet Take 1 tablet (25 mg total) by mouth every 6 (six) hours as needed for itching (rash). Patient not taking: Reported on 05/03/2022 12/01/20   Petrucelli, Glynda Jaeger, PA-C  famotidine (PEPCID) 20 MG tablet Take 1 tablet (20 mg total) by mouth 2 (two) times daily as needed (rash/itching.). Patient not taking: Reported on 05/03/2022 12/01/20   Petrucelli, Glynda Jaeger, PA-C    Current Outpatient Medications  Medication Sig Dispense Refill   allopurinol (ZYLOPRIM) 100 MG tablet Take 100 mg by mouth daily.  amLODipine-benazepril (LOTREL) 10-20 MG capsule Take 1 capsule by mouth daily.     aspirin 325 MG tablet Take 1 tablet (325 mg total) by mouth daily.     atorvastatin (LIPITOR) 80 MG tablet 1 tab(s) orally once a day for 90 days     Cholecalciferol (VITAMIN D3) 5000 units TABS Take 1 tablet by mouth daily.  0   colchicine 0.6 MG tablet Take 2 tablets by mouth and then a third tablet by mouth one hour later 3 tablet 0   ezetimibe (ZETIA) 10 MG tablet 1 tab(s) orally once a day for 90 days     febuxostat (ULORIC) 40 MG tablet Take 40 mg by mouth  daily.     furosemide (LASIX) 20 MG tablet Take 20-40 mg by mouth daily.     gabapentin (NEURONTIN) 300 MG capsule 1 cap(s) orally 3 times a day for 90 days     indomethacin (INDOCIN) 50 MG capsule Take 1 capsule (50 mg total) by mouth 2 (two) times daily. 20 capsule 0   cloNIDine (CATAPRES) 0.1 MG tablet Take 1 tablet (0.1 mg total) by mouth 2 (two) times daily. (Patient not taking: Reported on 05/03/2022) 20 tablet 0   diphenhydrAMINE (BENADRYL) 25 MG tablet Take 1 tablet (25 mg total) by mouth every 6 (six) hours as needed for itching (rash). (Patient not taking: Reported on 05/03/2022) 30 tablet 0   famotidine (PEPCID) 20 MG tablet Take 1 tablet (20 mg total) by mouth 2 (two) times daily as needed (rash/itching.). (Patient not taking: Reported on 05/03/2022) 10 tablet 0   Current Facility-Administered Medications  Medication Dose Route Frequency Provider Last Rate Last Admin   0.9 %  sodium chloride infusion  500 mL Intravenous Once Daimion Adamcik V, DO        Allergies as of 05/25/2022   (No Known Allergies)    Family History  Problem Relation Age of Onset   Hypertension Mother    Cirrhosis Father    Colon polyps Neg Hx    Colon cancer Neg Hx    Esophageal cancer Neg Hx    Stomach cancer Neg Hx    Rectal cancer Neg Hx    Crohn's disease Neg Hx    Ulcerative colitis Neg Hx     Social History   Socioeconomic History   Marital status: Single    Spouse name: Not on file   Number of children: 0   Years of education: 12   Highest education level: Not on file  Occupational History    Comment: Disabled  Tobacco Use   Smoking status: Never    Passive exposure: Never   Smokeless tobacco: Never  Vaping Use   Vaping Use: Never used  Substance and Sexual Activity   Alcohol use: Yes    Alcohol/week: 0.0 - 4.0 standard drinks of alcohol    Comment: occ   Drug use: No   Sexual activity: Not on file  Other Topics Concern   Not on file  Social History Narrative   Patient lives  alone and he is single.   Right handed.   Caffeine-    Education- 12 th grade   Disabled.   Social Determinants of Health   Financial Resource Strain: Not on file  Food Insecurity: Not on file  Transportation Needs: Not on file  Physical Activity: Not on file  Stress: Not on file  Social Connections: Not on file  Intimate Partner Violence: Not on file    Physical Exam: Vital  signs in last 24 hours: '@BP'$  113/68   Pulse 77   Temp (!) 97.5 F (36.4 C)   Ht '6\' 1"'$  (1.854 m)   Wt 249 lb (112.9 kg)   SpO2 96%   BMI 32.85 kg/m  GEN: NAD EYE: Sclerae anicteric ENT: MMM CV: Non-tachycardic Pulm: CTA b/l GI: Soft, NT/ND NEURO:  Alert & Oriented x 3   Gerrit Heck, DO [facility]   05/25/2022 1:18 PM

## 2022-05-26 DIAGNOSIS — I10 Essential (primary) hypertension: Secondary | ICD-10-CM | POA: Diagnosis not present

## 2022-05-26 DIAGNOSIS — G894 Chronic pain syndrome: Secondary | ICD-10-CM | POA: Diagnosis not present

## 2022-05-26 DIAGNOSIS — G629 Polyneuropathy, unspecified: Secondary | ICD-10-CM | POA: Diagnosis not present

## 2022-05-28 ENCOUNTER — Telehealth: Payer: Self-pay

## 2022-05-28 NOTE — Telephone Encounter (Signed)
No answer on follow up call. 

## 2022-06-13 ENCOUNTER — Other Ambulatory Visit: Payer: Self-pay | Admitting: Nurse Practitioner

## 2022-06-13 DIAGNOSIS — K760 Fatty (change of) liver, not elsewhere classified: Secondary | ICD-10-CM | POA: Diagnosis not present

## 2022-06-13 DIAGNOSIS — R748 Abnormal levels of other serum enzymes: Secondary | ICD-10-CM | POA: Diagnosis not present

## 2022-06-13 DIAGNOSIS — F1010 Alcohol abuse, uncomplicated: Secondary | ICD-10-CM

## 2022-06-13 DIAGNOSIS — K7401 Hepatic fibrosis, early fibrosis: Secondary | ICD-10-CM | POA: Diagnosis not present

## 2022-06-14 ENCOUNTER — Encounter: Payer: Self-pay | Admitting: Physical Medicine & Rehabilitation

## 2022-06-14 ENCOUNTER — Encounter: Payer: Medicare Other | Attending: Physical Medicine & Rehabilitation | Admitting: Physical Medicine & Rehabilitation

## 2022-06-14 VITALS — BP 131/87 | HR 78 | Temp 98.3°F | Ht 73.0 in | Wt 252.0 lb

## 2022-06-14 DIAGNOSIS — G8111 Spastic hemiplegia affecting right dominant side: Secondary | ICD-10-CM | POA: Insufficient documentation

## 2022-06-14 MED ORDER — ONABOTULINUMTOXINA 100 UNITS IJ SOLR
400.0000 [IU] | Freq: Once | INTRAMUSCULAR | Status: AC
  Administered 2022-06-14: 400 [IU] via INTRAMUSCULAR

## 2022-06-14 NOTE — Progress Notes (Signed)
Xeomin Injection for spasticity using needle EMG guidance  Dilution: 50 Units/ml Indication: Severe spasticity which interferes with ADL,mobility and/or  hygiene and is unresponsive to medication management and other conservative care Informed consent was obtained after describing risks and benefits of the procedure with the patient. This includes bleeding, bruising, infection, excessive weakness, or medication side effects. A REMS form is on file and signed. Needle: 25g 2" needle electrode-  Number of units per muscle Botox FCR50 FCU50 FDS50 FDP50 Biceps 50  PT 50  All injections were done after obtaining appropriate EMG activity and after negative drawback for blood. The patient tolerated the procedure well. Post procedure instructions were given. A followup appointment was made.       

## 2022-06-14 NOTE — Patient Instructions (Signed)
You received a Xeomin injection today. You may experience soreness at the needle injection sites. Please call us if any of the injection sites turns red after a couple days or if there is any drainage. You may experience muscle weakness as a result of Xeomin This would improve with time but can take several weeks to improve. The Xeomin should start working in about one week. The Xeomin usually last 3 months. The injection can be repeated every 3 months as needed.  

## 2022-06-20 ENCOUNTER — Ambulatory Visit
Admission: RE | Admit: 2022-06-20 | Discharge: 2022-06-20 | Disposition: A | Discharge status: Home / Self Care | Payer: Medicare Other | Source: Ambulatory Visit | Attending: Nurse Practitioner | Admitting: Nurse Practitioner

## 2022-06-20 DIAGNOSIS — F1010 Alcohol abuse, uncomplicated: Secondary | ICD-10-CM

## 2022-06-20 DIAGNOSIS — R748 Abnormal levels of other serum enzymes: Secondary | ICD-10-CM

## 2022-06-20 DIAGNOSIS — R945 Abnormal results of liver function studies: Secondary | ICD-10-CM | POA: Diagnosis not present

## 2022-06-20 DIAGNOSIS — K7401 Hepatic fibrosis, early fibrosis: Secondary | ICD-10-CM

## 2022-06-26 DIAGNOSIS — I10 Essential (primary) hypertension: Secondary | ICD-10-CM | POA: Diagnosis not present

## 2022-06-26 DIAGNOSIS — G894 Chronic pain syndrome: Secondary | ICD-10-CM | POA: Diagnosis not present

## 2022-06-26 DIAGNOSIS — G629 Polyneuropathy, unspecified: Secondary | ICD-10-CM | POA: Diagnosis not present

## 2022-07-04 DIAGNOSIS — Z23 Encounter for immunization: Secondary | ICD-10-CM | POA: Diagnosis not present

## 2022-07-04 DIAGNOSIS — M1A09X0 Idiopathic chronic gout, multiple sites, without tophus (tophi): Secondary | ICD-10-CM | POA: Diagnosis not present

## 2022-07-04 DIAGNOSIS — I10 Essential (primary) hypertension: Secondary | ICD-10-CM | POA: Diagnosis not present

## 2022-07-04 DIAGNOSIS — E7800 Pure hypercholesterolemia, unspecified: Secondary | ICD-10-CM | POA: Diagnosis not present

## 2022-07-04 DIAGNOSIS — E119 Type 2 diabetes mellitus without complications: Secondary | ICD-10-CM | POA: Diagnosis not present

## 2022-07-16 DIAGNOSIS — I69351 Hemiplegia and hemiparesis following cerebral infarction affecting right dominant side: Secondary | ICD-10-CM | POA: Diagnosis not present

## 2022-07-16 DIAGNOSIS — I10 Essential (primary) hypertension: Secondary | ICD-10-CM | POA: Diagnosis not present

## 2022-07-16 DIAGNOSIS — I672 Cerebral atherosclerosis: Secondary | ICD-10-CM | POA: Diagnosis not present

## 2022-07-16 DIAGNOSIS — E785 Hyperlipidemia, unspecified: Secondary | ICD-10-CM | POA: Diagnosis not present

## 2022-07-26 DIAGNOSIS — G894 Chronic pain syndrome: Secondary | ICD-10-CM | POA: Diagnosis not present

## 2022-07-26 DIAGNOSIS — I10 Essential (primary) hypertension: Secondary | ICD-10-CM | POA: Diagnosis not present

## 2022-07-26 DIAGNOSIS — M1009 Idiopathic gout, multiple sites: Secondary | ICD-10-CM | POA: Diagnosis not present

## 2022-07-26 DIAGNOSIS — G629 Polyneuropathy, unspecified: Secondary | ICD-10-CM | POA: Diagnosis not present

## 2022-08-01 DIAGNOSIS — E119 Type 2 diabetes mellitus without complications: Secondary | ICD-10-CM | POA: Diagnosis not present

## 2022-08-01 DIAGNOSIS — I10 Essential (primary) hypertension: Secondary | ICD-10-CM | POA: Diagnosis not present

## 2022-08-01 DIAGNOSIS — M1A09X0 Idiopathic chronic gout, multiple sites, without tophus (tophi): Secondary | ICD-10-CM | POA: Diagnosis not present

## 2022-08-26 DIAGNOSIS — G894 Chronic pain syndrome: Secondary | ICD-10-CM | POA: Diagnosis not present

## 2022-08-26 DIAGNOSIS — I10 Essential (primary) hypertension: Secondary | ICD-10-CM | POA: Diagnosis not present

## 2022-08-26 DIAGNOSIS — G629 Polyneuropathy, unspecified: Secondary | ICD-10-CM | POA: Diagnosis not present

## 2022-09-02 IMAGING — CR DG KNEE COMPLETE 4+V*L*
4 series · 4 of 4 positions shown · non-contrast
Comparison: None.

CLINICAL DATA: Fall, left knee pain

EXAM:
LEFT KNEE - COMPLETE 4+ VIEW

[t knee obl left (1 of 2)]
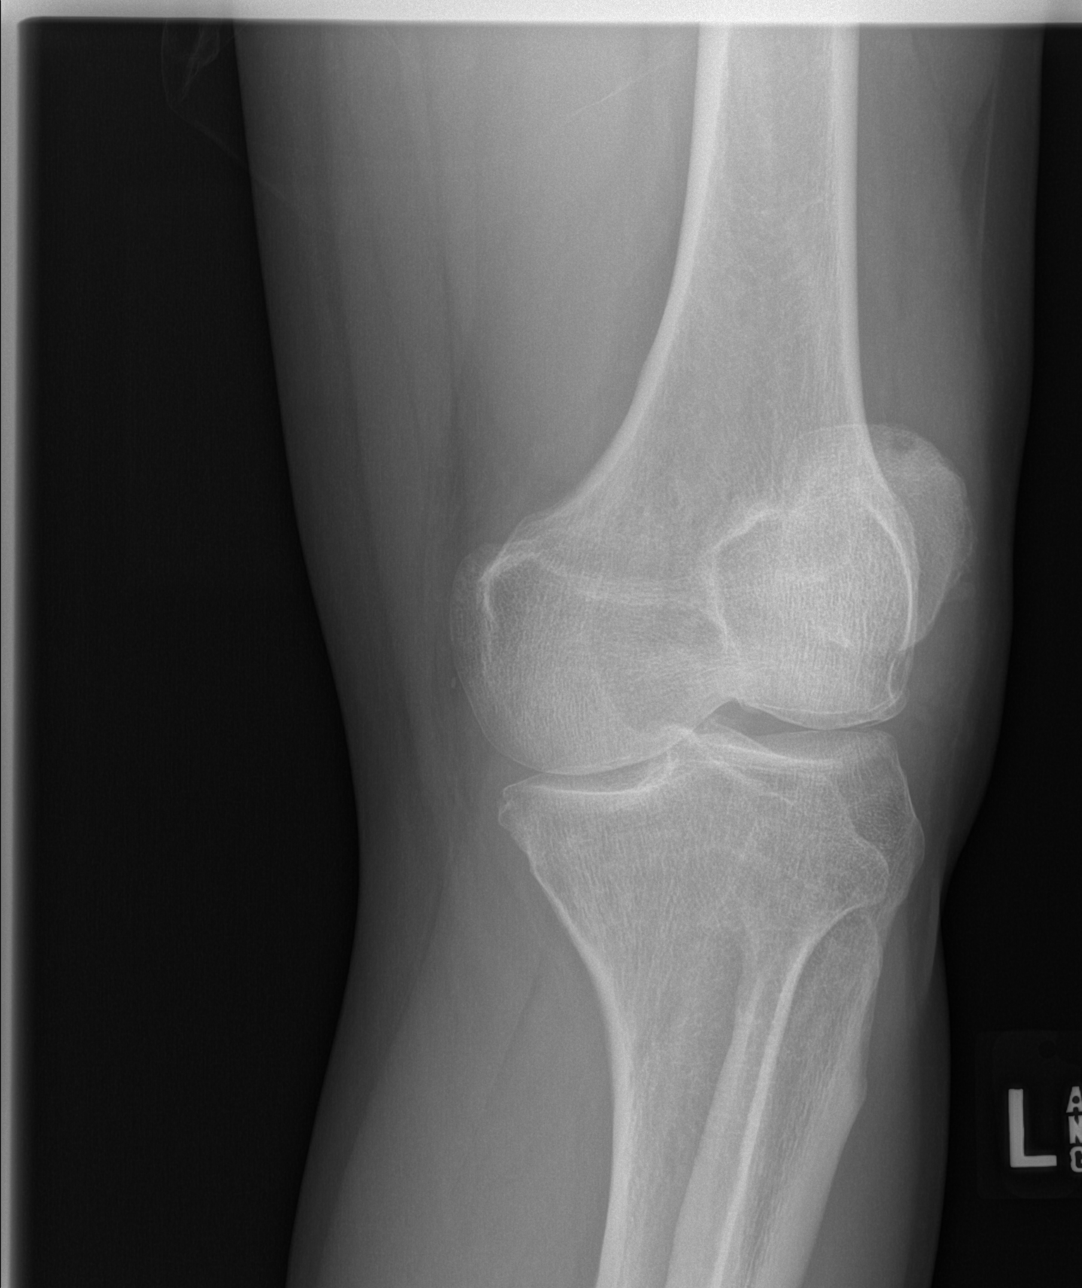

[t knee ap left]
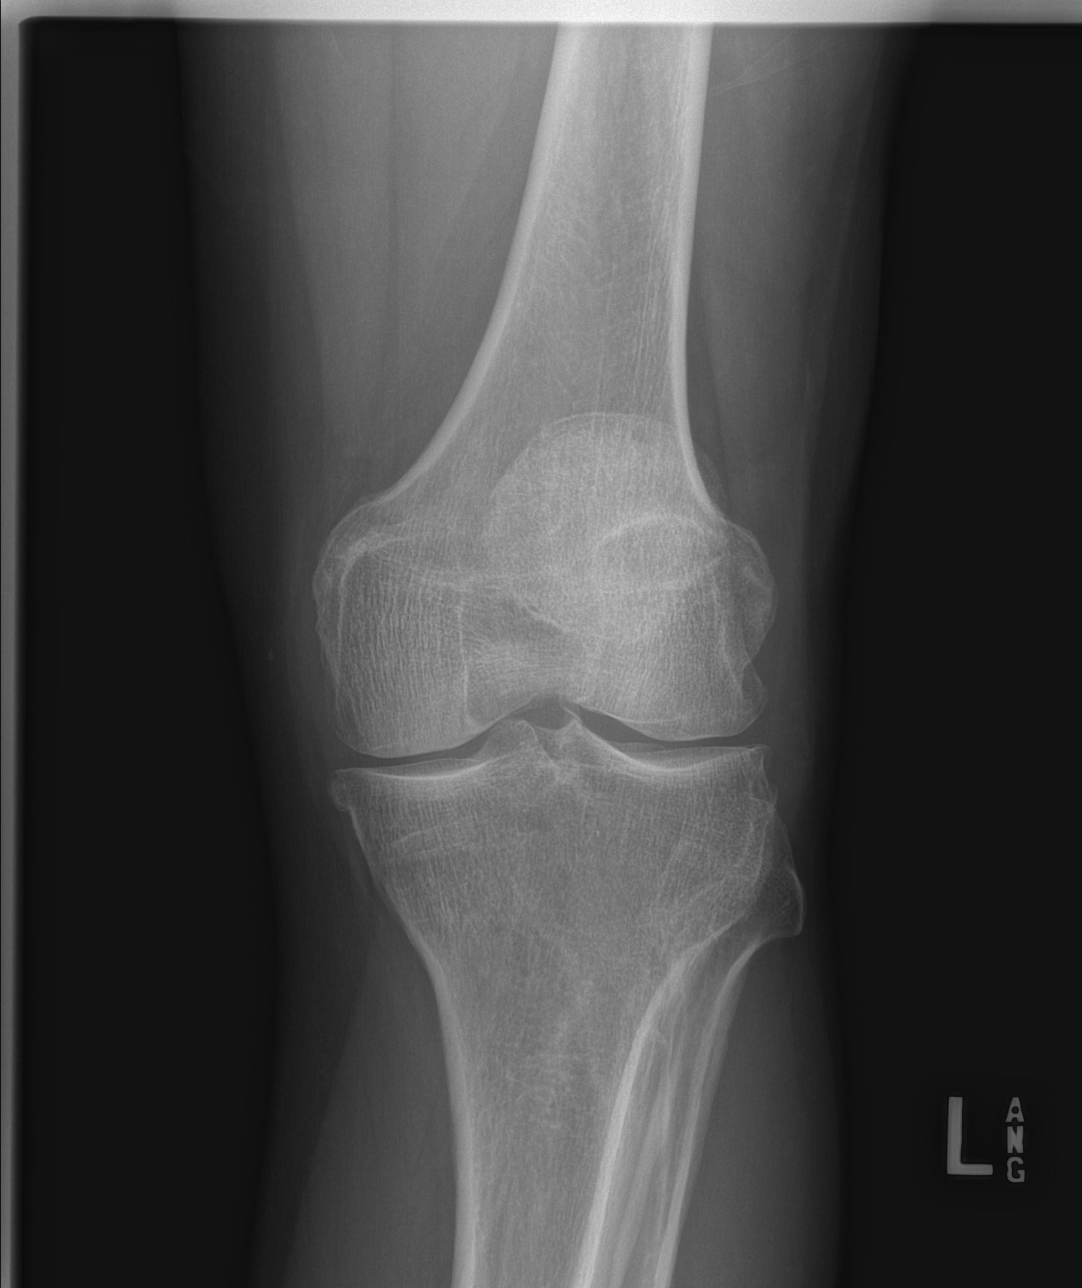

[t knee obl left (2 of 2)]
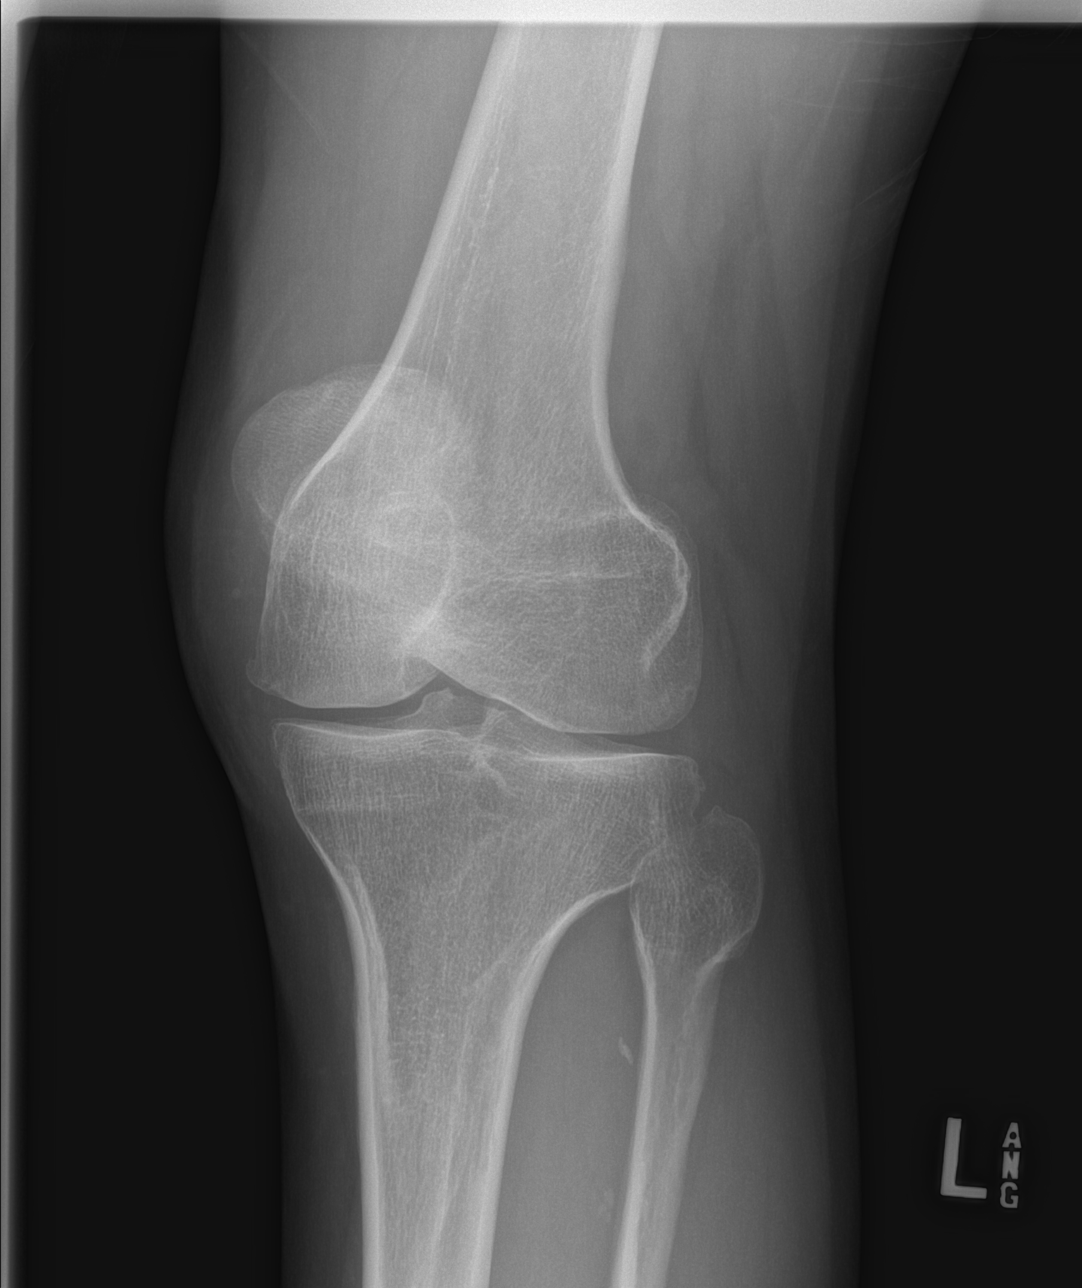

[t knee lat left]
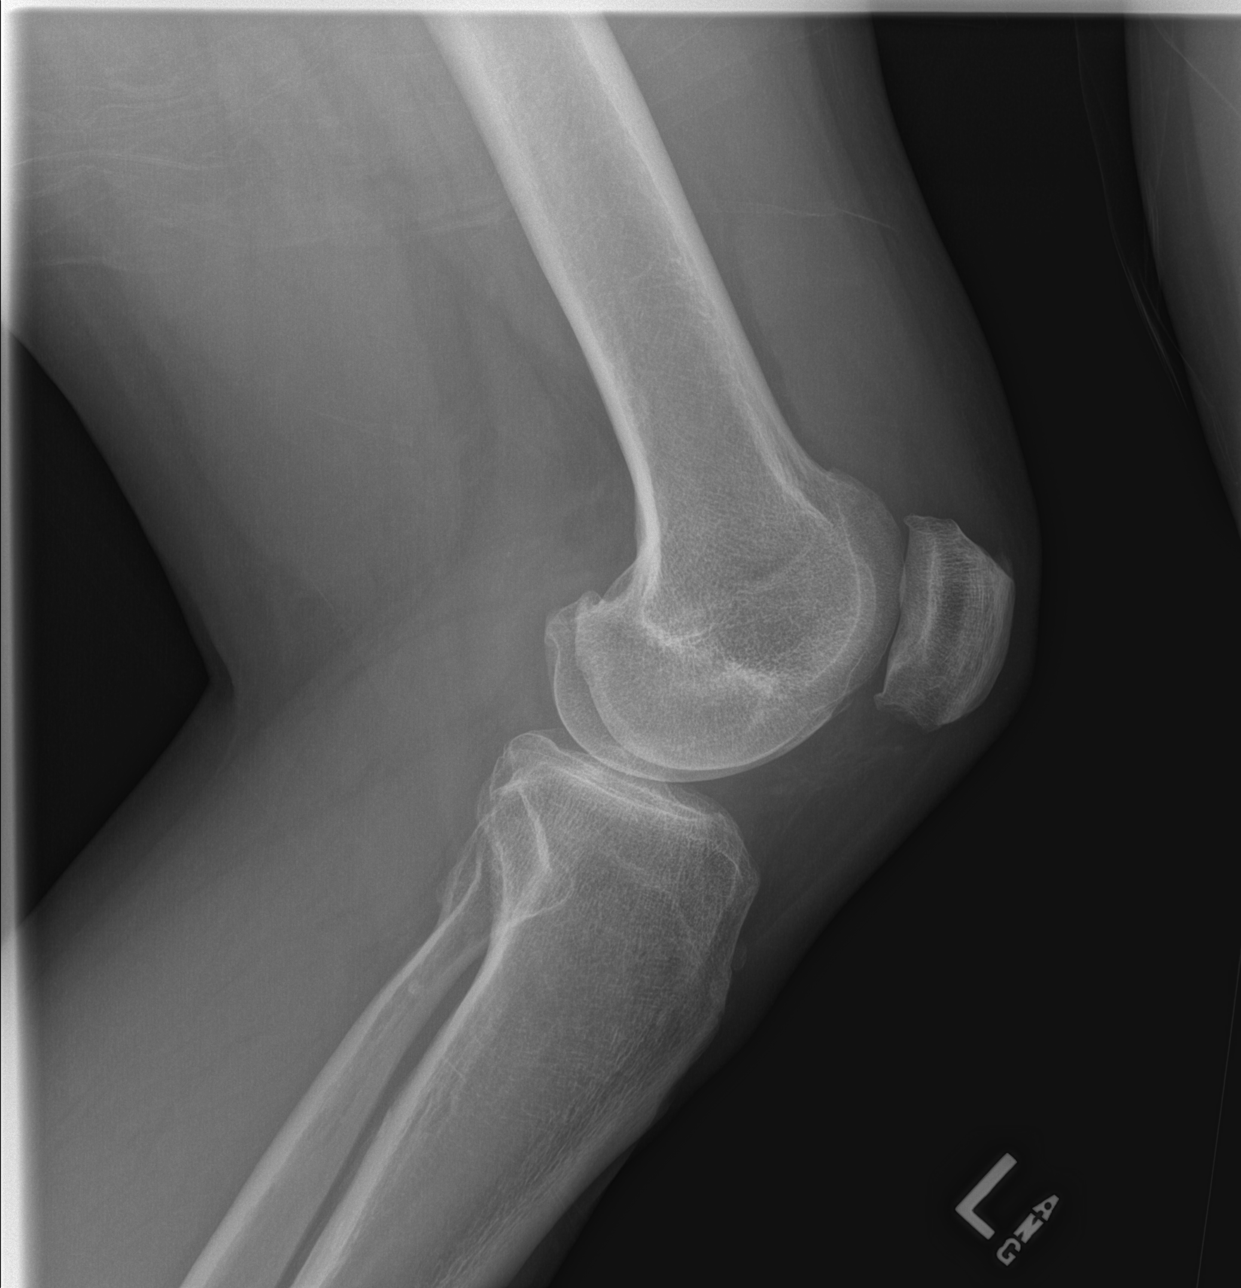

[4 of 4 positions shown; findings below may reference images not displayed]

FINDINGS: Degenerative spurring and joint space narrowing throughout the left
knee. No acute bony abnormality. Specifically, no fracture,
subluxation, or dislocation. Small joint effusion.
IMPRESSION: Degenerative changes with small joint effusion.

No acute bony abnormality.

## 2022-09-18 ENCOUNTER — Encounter: Payer: Self-pay | Admitting: Physical Medicine & Rehabilitation

## 2022-09-18 ENCOUNTER — Encounter: Payer: 59 | Attending: Physical Medicine & Rehabilitation | Admitting: Physical Medicine & Rehabilitation

## 2022-09-18 VITALS — BP 124/87 | HR 90 | Ht 73.0 in | Wt 248.0 lb

## 2022-09-18 DIAGNOSIS — G8111 Spastic hemiplegia affecting right dominant side: Secondary | ICD-10-CM | POA: Diagnosis not present

## 2022-09-18 MED ORDER — INCOBOTULINUMTOXINA 100 UNITS IM SOLR
300.0000 [IU] | Freq: Once | INTRAMUSCULAR | Status: AC
  Administered 2022-09-18: 300 [IU] via INTRAMUSCULAR

## 2022-09-18 NOTE — Patient Instructions (Signed)

## 2022-09-18 NOTE — Progress Notes (Signed)
Xeomin Injection for spasticity using needle EMG guidance  Dilution: 50 Units/ml Indication: Severe spasticity which interferes with ADL,mobility and/or  hygiene and is unresponsive to medication management and other conservative care Informed consent was obtained after describing risks and benefits of the procedure with the patient. This includes bleeding, bruising, infection, excessive weakness, or medication side effects. A REMS form is on file and signed. Needle: 25g 2" needle electrode-  Number of units per muscle Botox FCR50 FCU50 FDS50 FDP50 Biceps 50  PT 50  All injections were done after obtaining appropriate EMG activity and after negative drawback for blood. The patient tolerated the procedure well. Post procedure instructions were given. A followup appointment was made.

## 2022-10-25 DIAGNOSIS — G629 Polyneuropathy, unspecified: Secondary | ICD-10-CM | POA: Diagnosis not present

## 2022-10-25 DIAGNOSIS — M1009 Idiopathic gout, multiple sites: Secondary | ICD-10-CM | POA: Diagnosis not present

## 2022-10-25 DIAGNOSIS — G894 Chronic pain syndrome: Secondary | ICD-10-CM | POA: Diagnosis not present

## 2022-10-25 DIAGNOSIS — I10 Essential (primary) hypertension: Secondary | ICD-10-CM | POA: Diagnosis not present

## 2022-10-28 ENCOUNTER — Emergency Department (HOSPITAL_COMMUNITY)
Admission: EM | Admit: 2022-10-28 | Discharge: 2022-10-28 | Disposition: A | Discharge status: Home / Self Care | Payer: 59 | Attending: Emergency Medicine | Admitting: Emergency Medicine

## 2022-10-28 ENCOUNTER — Encounter (HOSPITAL_COMMUNITY): Payer: Self-pay

## 2022-10-28 DIAGNOSIS — Z7982 Long term (current) use of aspirin: Secondary | ICD-10-CM | POA: Insufficient documentation

## 2022-10-28 DIAGNOSIS — M25532 Pain in left wrist: Secondary | ICD-10-CM | POA: Diagnosis present

## 2022-10-28 DIAGNOSIS — M109 Gout, unspecified: Secondary | ICD-10-CM | POA: Diagnosis not present

## 2022-10-28 DIAGNOSIS — M10039 Idiopathic gout, unspecified wrist: Secondary | ICD-10-CM | POA: Diagnosis not present

## 2022-10-28 MED ORDER — PREDNISONE 10 MG (21) PO TBPK: ORAL_TABLET | Freq: Every day | ORAL | 0 refills | Status: AC

## 2022-10-28 MED ORDER — HYDROCODONE-ACETAMINOPHEN 5-325 MG PO TABS: 1.0000 | ORAL_TABLET | Freq: Four times a day (QID) | ORAL | 0 refills | Status: AC | PRN

## 2022-10-28 NOTE — Discharge Instructions (Signed)
Given your history of gout, and presentation today likely have gout flareup.  We discussed that we cannot rule out septic joint without doing a fluid analysis from the joint.  Discussed risks and benefits of this.  You would like to proceed with treatment for gout and return for any concerning symptoms.  Continue taking your home colchicine.  I have sent in steroid course for you.  Have also sent in short course of pain medication for you to use for severe breakthrough pain.  For any concerning symptoms return to the emergency department otherwise follow-up with your primary care provider.

## 2022-10-28 NOTE — ED Triage Notes (Signed)
Pt reports he his having a gout flair up in his left wrist. States pain and swelling started last night.  Denies any other issues or symptoms

## 2022-10-28 NOTE — ED Notes (Signed)
Pt A&OX4 ambulatory at d/c with independent steady gait. Pt verbalized understanding of d/c instructions, prescriptions and follow up care.

## 2022-10-28 NOTE — ED Provider Notes (Signed)
[facility] Provider Note   CSN: [id number] Arrival date & time: 10/28/22  1310     History  Chief Complaint  Patient presents with   Gout    Randall Mccall is a 52 y.o. male.  52 year old male with past medical history significant for gout presents today for evaluation of left wrist pain that started this morning.  He states last night he had spicy foods which is a trigger for his gout.  He does take colchicine which he has been compliant with.  Denies any recent injury, or open wounds to his left wrist.  States he used to be right-handed however he had a procedure done on his right arm and is currently left-hand-dominant.  Denies fever.  States this feels typical with his previous gout flares.  The history is provided by the patient. No language interpreter was used.       Home Medications Prior to Admission medications   Medication Sig Start Date End Date Taking? Authorizing Provider  HYDROcodone-acetaminophen (NORCO/VICODIN) 5-325 MG tablet Take 1-2 tablets by mouth every 6 (six) hours as needed for severe pain. 10/28/22  Yes Whyatt Klinger, PA-C  predniSONE (STERAPRED UNI-PAK 21 TAB) 10 MG (21) TBPK tablet Take by mouth daily. Take 6 tabs by mouth daily  for 2 days, then 5 tabs for 2 days, then 4 tabs for 2 days, then 3 tabs for 2 days, 2 tabs for 2 days, then 1 tab by mouth daily for 2 days 10/28/22  Yes Deatra Canter, Mariadelaluz Guggenheim, PA-C  allopurinol (ZYLOPRIM) 100 MG tablet Take 100 mg by mouth daily. 12/19/20   [provider]  amLODipine-benazepril (LOTREL) 10-20 MG capsule Take 1 capsule by mouth daily. 12/15/20   [provider]  amLODipine-benazepril (LOTREL) 10-20 MG capsule 1 cap(s) orally once a day    [provider]  aspirin 325 MG tablet Take 1 tablet (325 mg total) by mouth daily. 09/18/16   Nita Sells, MD  atorvastatin (LIPITOR) 80 MG tablet 1 tab(s) orally once a day for 90 days    [provider]   Cholecalciferol (VITAMIN D3) 5000 units TABS Take 1 tablet by mouth daily. 09/25/17   [provider]  colchicine 0.6 MG tablet 1 tab(s) orally once a day    [provider]  ezetimibe (ZETIA) 10 MG tablet 1 tab(s) orally once a day for 90 days    [provider]  famotidine (PEPCID) 20 MG tablet Take 1 tablet (20 mg total) by mouth 2 (two) times daily as needed (rash/itching.). Patient not taking: Reported on 05/03/2022 12/01/20   Petrucelli, Glynda Jaeger, PA-C  febuxostat (ULORIC) 40 MG tablet Take 40 mg by mouth daily. 03/22/20   [provider]  Febuxostat (ULORIC) 80 MG TABS 1 tab(s) orally once a day    [provider]  Febuxostat 80 MG TABS  02/22/22   [provider]  furosemide (LASIX) 20 MG tablet Take 20-40 mg by mouth daily. 02/08/20   [provider]  gabapentin (NEURONTIN) 300 MG capsule 1 cap(s) orally 3 times a day for 90 days    [provider]  indomethacin (INDOCIN) 50 MG capsule 1 cap(s) orally 2 times a day    [provider]      Allergies    Patient has no known allergies.    Review of Systems   Review of Systems  Constitutional:  Negative for chills and fever.  Musculoskeletal:  Positive for arthralgias  and joint swelling.  All other systems reviewed and are negative.   Physical Exam Updated Vital Signs BP (!) 136/91 (BP Location: Right Arm)   Pulse 96   Temp 98.8 F (37.1 C) (Oral)   Resp 18   Ht '6\' 1"'$  (1.854 m)   Wt 106.6 kg   SpO2 98%   BMI 31.00 kg/m  Physical Exam Vitals and nursing note reviewed.  Constitutional:      General: He is not in acute distress.    Appearance: Normal appearance. He is not ill-appearing.  HENT:     Head: Normocephalic and atraumatic.     Nose: Nose normal.  Eyes:     Conjunctiva/sclera: Conjunctivae normal.  Cardiovascular:     Rate and Rhythm: Normal rate.  Pulmonary:     Effort: Pulmonary effort is normal. No respiratory distress.   Musculoskeletal:        General: No deformity.     Comments: Tenderness to palpation over the left wrist.  Mild swelling noted.  Without overlying erythema.  Joint is warm.  Range of motion in all digits of the left hand.  Sensation intact.  Good grip strength.  Elbow without tenderness to palpation.  Forearm compartments soft.  Skin:    Findings: No rash.  Neurological:     Mental Status: He is alert.     ED Results / Procedures / Treatments   Labs (all labs ordered are listed, but only abnormal results are displayed) Labs Reviewed - No data to display  EKG None  Radiology No results found.  Procedures Procedures    Medications Ordered in ED Medications - No data to display  ED Course/ Medical Decision Making/ A&P                             Medical Decision Making Risk Prescription drug management.   52 year old male presents with complaint of left wrist.  History of gout.  States he has spicy food last night which is a trigger for his gout.  He is taking colchicine.  Will start patient on steroid Dosepak, and provide short course of pain medication to keep on hand for severe breakthrough pain.  We had an extensive discussion regarding concern for septic joint as well and requiring arthrocentesis for definitive diagnosis.  He would not like to proceed with this at this time.  States he would like to return if he has any worsening symptoms.  Currently he states this feels similar to his prior gout flareups and would like to treat empirically for gout.  Feel this is reasonable.  Patient is stable for discharge.  Discharged in stable condition.  Strict return precautions given.   Final Clinical Impression(s) / ED Diagnoses Final diagnoses:  Acute gout of left wrist, unspecified cause    Rx / DC Orders ED Discharge Orders          Ordered    predniSONE (STERAPRED UNI-PAK 21 TAB) 10 MG (21) TBPK tablet  Daily        10/28/22 1446    HYDROcodone-acetaminophen  (NORCO/VICODIN) 5-325 MG tablet  Every 6 hours PRN        10/28/22 1446              Evlyn Courier, PA-C 10/28/22 1502    Lajean Saver, MD 11/01/22 1505

## 2022-11-01 DIAGNOSIS — Z0001 Encounter for general adult medical examination with abnormal findings: Secondary | ICD-10-CM | POA: Diagnosis not present

## 2022-11-01 DIAGNOSIS — E119 Type 2 diabetes mellitus without complications: Secondary | ICD-10-CM | POA: Diagnosis not present

## 2022-11-01 DIAGNOSIS — I10 Essential (primary) hypertension: Secondary | ICD-10-CM | POA: Diagnosis not present

## 2022-11-01 DIAGNOSIS — M1A09X0 Idiopathic chronic gout, multiple sites, without tophus (tophi): Secondary | ICD-10-CM | POA: Diagnosis not present

## 2022-11-07 ENCOUNTER — Telehealth: Payer: Self-pay | Admitting: *Deleted

## 2022-11-07 NOTE — Telephone Encounter (Signed)
     Patient  visit on 10/28/2022  at [facility]  was for gout  Have you been able to follow up with your primary care physician?does not need gout has resolved has transportation and all med he needs   The patient was or was not able to obtain any needed medicine or equipment.  Are there diet recommendations that you are having difficulty following?  Patient expresses understanding of discharge instructions and education provided has no other needs at this time.    [place] [phone] , [place] [address] Email : [email or web address]

## 2022-11-14 DIAGNOSIS — M1A09X0 Idiopathic chronic gout, multiple sites, without tophus (tophi): Secondary | ICD-10-CM | POA: Diagnosis not present

## 2022-11-14 DIAGNOSIS — G629 Polyneuropathy, unspecified: Secondary | ICD-10-CM | POA: Diagnosis not present

## 2022-11-14 DIAGNOSIS — G894 Chronic pain syndrome: Secondary | ICD-10-CM | POA: Diagnosis not present

## 2022-11-14 DIAGNOSIS — Z8673 Personal history of transient ischemic attack (TIA), and cerebral infarction without residual deficits: Secondary | ICD-10-CM | POA: Diagnosis not present

## 2022-11-14 DIAGNOSIS — E119 Type 2 diabetes mellitus without complications: Secondary | ICD-10-CM | POA: Diagnosis not present

## 2022-11-14 DIAGNOSIS — G8321 Monoplegia of upper limb affecting right dominant side: Secondary | ICD-10-CM | POA: Diagnosis not present

## 2022-11-14 DIAGNOSIS — I10 Essential (primary) hypertension: Secondary | ICD-10-CM | POA: Diagnosis not present

## 2022-11-14 DIAGNOSIS — E790 Hyperuricemia without signs of inflammatory arthritis and tophaceous disease: Secondary | ICD-10-CM | POA: Diagnosis not present

## 2022-11-23 DIAGNOSIS — M1009 Idiopathic gout, multiple sites: Secondary | ICD-10-CM | POA: Diagnosis not present

## 2022-11-23 DIAGNOSIS — I10 Essential (primary) hypertension: Secondary | ICD-10-CM | POA: Diagnosis not present

## 2022-11-23 DIAGNOSIS — G894 Chronic pain syndrome: Secondary | ICD-10-CM | POA: Diagnosis not present

## 2022-11-23 DIAGNOSIS — G629 Polyneuropathy, unspecified: Secondary | ICD-10-CM | POA: Diagnosis not present

## 2022-12-13 DIAGNOSIS — K7401 Hepatic fibrosis, early fibrosis: Secondary | ICD-10-CM | POA: Diagnosis not present

## 2022-12-13 DIAGNOSIS — R748 Abnormal levels of other serum enzymes: Secondary | ICD-10-CM | POA: Diagnosis not present

## 2022-12-13 DIAGNOSIS — F1090 Alcohol use, unspecified, uncomplicated: Secondary | ICD-10-CM | POA: Diagnosis not present

## 2022-12-13 DIAGNOSIS — K760 Fatty (change of) liver, not elsewhere classified: Secondary | ICD-10-CM | POA: Diagnosis not present

## 2022-12-18 ENCOUNTER — Encounter: Payer: 59 | Attending: Physical Medicine & Rehabilitation | Admitting: Physical Medicine & Rehabilitation

## 2022-12-18 ENCOUNTER — Encounter: Payer: Self-pay | Admitting: Physical Medicine & Rehabilitation

## 2022-12-18 VITALS — BP 118/83 | HR 84 | Ht 73.0 in | Wt 243.6 lb

## 2022-12-18 DIAGNOSIS — G8111 Spastic hemiplegia affecting right dominant side: Secondary | ICD-10-CM | POA: Insufficient documentation

## 2022-12-18 MED ORDER — SODIUM CHLORIDE (PF) 0.9 % IJ SOLN
8.0000 mL | Freq: Once | INTRAMUSCULAR | Status: AC
Start: 2022-12-18 — End: 2022-12-18
  Administered 2022-12-18: 8 mL via INTRAVENOUS

## 2022-12-18 MED ORDER — INCOBOTULINUMTOXINA 100 UNITS IM SOLR
400.0000 [IU] | Freq: Once | INTRAMUSCULAR | Status: AC
Start: 2022-12-18 — End: 2022-12-18
  Administered 2022-12-18: 400 [IU] via INTRAMUSCULAR

## 2022-12-18 NOTE — Progress Notes (Signed)
Xeomin Injection for spasticity using needle EMG guidance  Dilution: 50 Units/ml Indication: Severe spasticity which interferes with ADL,mobility and/or  hygiene and is unresponsive to medication management and other conservative care Informed consent was obtained after describing risks and benefits of the procedure with the patient. This includes bleeding, bruising, infection, excessive weakness, or medication side effects. A REMS form is on file and signed. Needle: 25g 2" needle electrode-  Number of units per muscle Botox FCR50 FCU50 FDS50 FDP50 Biceps 100  PT 50 Waste 50U All injections were done after obtaining appropriate EMG activity and after negative drawback for blood. The patient tolerated the procedure well. Post procedure instructions were given. A followup appointment was made.   Increased dose to RIght biceps from 50U to 100U will do f/u visit to assess effect

## 2022-12-18 NOTE — Addendum Note (Signed)
Addended by: Silas Sacramento T on: 12/18/2022 01:54 PM   Modules accepted: Orders

## 2023-01-08 DIAGNOSIS — I639 Cerebral infarction, unspecified: Secondary | ICD-10-CM | POA: Diagnosis not present

## 2023-01-14 DIAGNOSIS — I10 Essential (primary) hypertension: Secondary | ICD-10-CM | POA: Diagnosis not present

## 2023-01-14 DIAGNOSIS — E785 Hyperlipidemia, unspecified: Secondary | ICD-10-CM | POA: Diagnosis not present

## 2023-01-14 DIAGNOSIS — I672 Cerebral atherosclerosis: Secondary | ICD-10-CM | POA: Diagnosis not present

## 2023-01-14 DIAGNOSIS — I69351 Hemiplegia and hemiparesis following cerebral infarction affecting right dominant side: Secondary | ICD-10-CM | POA: Diagnosis not present

## 2023-01-15 DIAGNOSIS — G8321 Monoplegia of upper limb affecting right dominant side: Secondary | ICD-10-CM | POA: Diagnosis not present

## 2023-01-15 DIAGNOSIS — I10 Essential (primary) hypertension: Secondary | ICD-10-CM | POA: Diagnosis not present

## 2023-01-15 DIAGNOSIS — G894 Chronic pain syndrome: Secondary | ICD-10-CM | POA: Diagnosis not present

## 2023-01-15 DIAGNOSIS — E119 Type 2 diabetes mellitus without complications: Secondary | ICD-10-CM | POA: Diagnosis not present

## 2023-01-15 DIAGNOSIS — M1A09X0 Idiopathic chronic gout, multiple sites, without tophus (tophi): Secondary | ICD-10-CM | POA: Diagnosis not present

## 2023-01-15 DIAGNOSIS — Z8673 Personal history of transient ischemic attack (TIA), and cerebral infarction without residual deficits: Secondary | ICD-10-CM | POA: Diagnosis not present

## 2023-01-15 DIAGNOSIS — E790 Hyperuricemia without signs of inflammatory arthritis and tophaceous disease: Secondary | ICD-10-CM | POA: Diagnosis not present

## 2023-01-15 DIAGNOSIS — G629 Polyneuropathy, unspecified: Secondary | ICD-10-CM | POA: Diagnosis not present

## 2023-01-25 DIAGNOSIS — G629 Polyneuropathy, unspecified: Secondary | ICD-10-CM | POA: Diagnosis not present

## 2023-01-25 DIAGNOSIS — M1009 Idiopathic gout, multiple sites: Secondary | ICD-10-CM | POA: Diagnosis not present

## 2023-01-25 DIAGNOSIS — G894 Chronic pain syndrome: Secondary | ICD-10-CM | POA: Diagnosis not present

## 2023-01-25 DIAGNOSIS — I10 Essential (primary) hypertension: Secondary | ICD-10-CM | POA: Diagnosis not present

## 2023-01-29 ENCOUNTER — Encounter: Payer: 59 | Attending: Physical Medicine & Rehabilitation | Admitting: Physical Medicine & Rehabilitation

## 2023-01-29 ENCOUNTER — Encounter: Payer: Self-pay | Admitting: Physical Medicine & Rehabilitation

## 2023-01-29 VITALS — BP 109/75 | HR 84 | Ht 73.0 in | Wt 237.4 lb

## 2023-01-29 DIAGNOSIS — G8111 Spastic hemiplegia affecting right dominant side: Secondary | ICD-10-CM | POA: Diagnosis not present

## 2023-01-29 DIAGNOSIS — I639 Cerebral infarction, unspecified: Secondary | ICD-10-CM | POA: Diagnosis not present

## 2023-01-29 NOTE — Progress Notes (Signed)
Subjective:    Patient ID: Randall Mccall, male    DOB: 04-Jul-1971, 52 y.o.   MRN: [id number]  HPI  52 year old male with chronic right spastic hemiplegia due to left CVA.  His spasticity was inadequately managed with oral agents as well as with physical therapy. The patient returns today for follow-up of his tone.  Botulinum toxin dose to the right biceps was increased from 50 units to 100 units.  The patient feels like he has had better relaxation of his elbow flexors.  12/18/22 Xeomin FCR50 FCU50 FDS50 FDP50 Biceps 100   PT 50 Waste 50U  Pain Inventory Average Pain 0 Pain Right Now 0 My pain is  no pain  LOCATION OF PAIN  no pain  BOWEL Number of stools per week: 7-10 Oral laxative use No  Type of laxative . Enema or suppository use No  History of colostomy No  Incontinent No   BLADDER Normal In and out cath, frequency . Able to self cath  . Bladder incontinence No  Frequent urination No  Leakage with coughing No  Difficulty starting stream No  Incomplete bladder emptying No    Mobility how many minutes can you walk? unlimited ability to climb steps?  yes do you drive?  no  Function disabled: date disabled .  Neuro/Psych weakness trouble walking  Prior Studies Any changes since last visit?  no  Physicians involved in your care Any changes since last visit?  no   Family History  Problem Relation Age of Onset   Hypertension Mother    Cirrhosis Father    Colon polyps Neg Hx    Colon cancer Neg Hx    Esophageal cancer Neg Hx    Stomach cancer Neg Hx    Rectal cancer Neg Hx    Crohn's disease Neg Hx    Ulcerative colitis Neg Hx    Social History   Socioeconomic History   Marital status: Single    Spouse name: Not on file   Number of children: 0   Years of education: 12   Highest education level: Not on file  Occupational History    Comment: Disabled  Tobacco Use   Smoking status: Never    Passive exposure: Never   Smokeless  tobacco: Never  Vaping Use   Vaping Use: Never used  Substance and Sexual Activity   Alcohol use: Yes    Alcohol/week: 0.0 - 4.0 standard drinks of alcohol    Comment: occ   Drug use: No   Sexual activity: Not on file  Other Topics Concern   Not on file  Social History Narrative   Patient lives alone and he is single.   Right handed.   Caffeine-    Education- 12 th grade   Disabled.   Social Determinants of Health   Financial Resource Strain: Not on file  Food Insecurity: Not on file  Transportation Needs: Not on file  Physical Activity: Not on file  Stress: Not on file  Social Connections: Not on file   Past Surgical History:  Procedure Laterality Date   COLONOSCOPY     12/29/2021   KNEE SURGERY Right    Past Medical History:  Diagnosis Date   Allergy    Gout    Hyperlipidemia    on meds   Hypertension    on meds   Muscle weakness    RIGHT   Stroke (HCC) 2018   right sided stroke   BP 109/75   Pulse 84  Ht 6\' 1"  (1.854 m)   Wt 237 lb 6.4 oz (107.7 kg)   SpO2 95%   BMI 31.32 kg/m   Opioid Risk Score:   Fall Risk Score:  `1  Depression screen [facility] 2/9     06/14/2022   10:01 AM 03/13/2022    9:37 AM 12/12/2021   10:08 AM 09/12/2021   10:10 AM 06/09/2021   10:31 AM 11/11/2020    1:14 PM 08/05/2020   10:50 AM  Depression screen PHQ 2/9  Decreased Interest 0 0 0 0 0 0 0  Down, Depressed, Hopeless 0 0 0 0 0 0 0  PHQ - 2 Score 0 0 0 0 0 0 0    Review of Systems  Musculoskeletal:  Positive for gait problem.  Neurological:  Positive for weakness.  All other systems reviewed and are negative.     Objective:   Physical Exam  General no acute distress Mood and affect appropriate Extremities without edema Tone Right upper extremity Elbow flexor MAS 1 finger flexors MAS 1 Wrist flexors MAS 1 Motor strength is 4 - at the right deltoid bicep tricep grip Sensation is intact to light touch right upper extremity although it feels differently than the  left upper extremity.      Assessment & Plan:   1.  Right spastic hemiplegia due to CVA The patient has had good results with botulinum toxin injections he is using incobotulinum toxin He has had better relaxation of the elbow flexors with the increase of dose to the right biceps from 50 units to 100 units.  Will continue with current dosing. Repeat in approximately 6 weeks

## 2023-02-20 DIAGNOSIS — G894 Chronic pain syndrome: Secondary | ICD-10-CM | POA: Diagnosis not present

## 2023-02-20 DIAGNOSIS — G629 Polyneuropathy, unspecified: Secondary | ICD-10-CM | POA: Diagnosis not present

## 2023-02-20 DIAGNOSIS — G8321 Monoplegia of upper limb affecting right dominant side: Secondary | ICD-10-CM | POA: Diagnosis not present

## 2023-02-20 DIAGNOSIS — E119 Type 2 diabetes mellitus without complications: Secondary | ICD-10-CM | POA: Diagnosis not present

## 2023-02-20 DIAGNOSIS — E790 Hyperuricemia without signs of inflammatory arthritis and tophaceous disease: Secondary | ICD-10-CM | POA: Diagnosis not present

## 2023-02-20 DIAGNOSIS — Z8673 Personal history of transient ischemic attack (TIA), and cerebral infarction without residual deficits: Secondary | ICD-10-CM | POA: Diagnosis not present

## 2023-02-20 DIAGNOSIS — I10 Essential (primary) hypertension: Secondary | ICD-10-CM | POA: Diagnosis not present

## 2023-02-20 DIAGNOSIS — M1A09X0 Idiopathic chronic gout, multiple sites, without tophus (tophi): Secondary | ICD-10-CM | POA: Diagnosis not present

## 2023-02-26 DIAGNOSIS — I639 Cerebral infarction, unspecified: Secondary | ICD-10-CM | POA: Diagnosis not present

## 2023-03-26 ENCOUNTER — Encounter: Payer: 59 | Attending: Physical Medicine & Rehabilitation | Admitting: Physical Medicine & Rehabilitation

## 2023-03-26 ENCOUNTER — Encounter: Payer: Self-pay | Admitting: Physical Medicine & Rehabilitation

## 2023-03-26 VITALS — BP 125/85 | HR 85 | Ht 73.0 in | Wt 241.0 lb

## 2023-03-26 DIAGNOSIS — G8111 Spastic hemiplegia affecting right dominant side: Secondary | ICD-10-CM | POA: Insufficient documentation

## 2023-03-26 MED ORDER — INCOBOTULINUMTOXINA 100 UNITS IM SOLR
400.0000 [IU] | Freq: Once | INTRAMUSCULAR | Status: AC
Start: 2023-03-26 — End: 2023-03-26
  Administered 2023-03-26: 400 [IU] via INTRAMUSCULAR

## 2023-03-26 MED ORDER — SODIUM CHLORIDE (PF) 0.9 % IJ SOLN
8.0000 mL | Freq: Once | INTRAMUSCULAR | Status: AC
Start: 2023-03-26 — End: 2023-03-26
  Administered 2023-03-26: 8 mL via INTRAVENOUS

## 2023-03-26 NOTE — Addendum Note (Signed)
Addended by: Janean Sark on: 03/26/2023 11:30 AM   Modules accepted: Orders

## 2023-03-26 NOTE — Addendum Note (Signed)
Addended by: Janean Sark on: 03/26/2023 11:28 AM   Modules accepted: Orders

## 2023-03-26 NOTE — Patient Instructions (Signed)

## 2023-03-26 NOTE — Progress Notes (Signed)
Xeomin Injection for spasticity using needle EMG guidance  Dilution: 50 Units/ml Indication: Severe spasticity which interferes with ADL,mobility and/or  hygiene and is unresponsive to medication management and other conservative care Informed consent was obtained after describing risks and benefits of the procedure with the patient. This includes bleeding, bruising, infection, excessive weakness, or medication side effects. A REMS form is on file and signed. Needle: 25g 2" needle electrode-  Number of units per muscle Botox FCR50 FCU50 FDS 75 FDP50 Biceps 100 Brachialis 50U PT 50 Waste 25 All injections were done after obtaining appropriate EMG activity and after negative drawback for blood. The patient tolerated the procedure well. Post procedure instructions were given. A followup appointment was made.   Increased dose to RIght biceps from 50U to 100U will do f/u visit to assess effect

## 2023-03-30 DIAGNOSIS — I639 Cerebral infarction, unspecified: Secondary | ICD-10-CM | POA: Diagnosis not present

## 2023-04-23 DIAGNOSIS — M1A09X0 Idiopathic chronic gout, multiple sites, without tophus (tophi): Secondary | ICD-10-CM | POA: Diagnosis not present

## 2023-04-23 DIAGNOSIS — G8321 Monoplegia of upper limb affecting right dominant side: Secondary | ICD-10-CM | POA: Diagnosis not present

## 2023-04-23 DIAGNOSIS — E119 Type 2 diabetes mellitus without complications: Secondary | ICD-10-CM | POA: Diagnosis not present

## 2023-04-23 DIAGNOSIS — G629 Polyneuropathy, unspecified: Secondary | ICD-10-CM | POA: Diagnosis not present

## 2023-04-23 DIAGNOSIS — Z8673 Personal history of transient ischemic attack (TIA), and cerebral infarction without residual deficits: Secondary | ICD-10-CM | POA: Diagnosis not present

## 2023-04-23 DIAGNOSIS — G894 Chronic pain syndrome: Secondary | ICD-10-CM | POA: Diagnosis not present

## 2023-04-23 DIAGNOSIS — I10 Essential (primary) hypertension: Secondary | ICD-10-CM | POA: Diagnosis not present

## 2023-04-23 DIAGNOSIS — E790 Hyperuricemia without signs of inflammatory arthritis and tophaceous disease: Secondary | ICD-10-CM | POA: Diagnosis not present

## 2023-05-01 DIAGNOSIS — I639 Cerebral infarction, unspecified: Secondary | ICD-10-CM | POA: Diagnosis not present

## 2023-05-07 ENCOUNTER — Encounter: Payer: Self-pay | Admitting: Physical Medicine & Rehabilitation

## 2023-05-07 ENCOUNTER — Encounter: Payer: 59 | Attending: Physical Medicine & Rehabilitation | Admitting: Physical Medicine & Rehabilitation

## 2023-05-07 VITALS — BP 124/86 | HR 84 | Ht 73.0 in | Wt 237.8 lb

## 2023-05-07 DIAGNOSIS — G8111 Spastic hemiplegia affecting right dominant side: Secondary | ICD-10-CM

## 2023-05-07 NOTE — Progress Notes (Signed)
   Established Patient Office Visit  Subjective   Patient ID: Randall Mccall, male    DOB: 09-14-1970  Age: 53 y.o. MRN: [id number]  No chief complaint on file.   HPI  52 yo male with Right spastic hemiparesis due to Left CVA who returns after Xeomin injection RUE ~6wks ago, with improvement in elbow , finger and wrist flexors  FCR50 FCU50 FDS 75 FDP50 Biceps 100 Brachialis 50U PT 50 Waste 25  ROS- no bleeding bruising or pain from injection sites     Objective:     BP 124/86   Pulse 84   Ht 6\' 1"  (1.854 m)   Wt 237 lb 12.8 oz (107.9 kg)   SpO2 95%   BMI 31.37 kg/m    Physical Exam MAS - 2 WF, FF, EF Motor 3- RIght shoulder , EF, WF FF 4/5 RLE   No results found for any visits on 05/07/23.    The ASCVD Risk score (Arnett DK, et al., 2019) failed to calculate for the following reasons:   The patient has a prior MI or stroke diagnosis    Assessment & Plan:   Right spastic hemiparesis due to CVA ~3 yrs ago  Problem List Items Addressed This Visit   None   No follow-ups on file.    Erick Colace, MD

## 2023-05-28 DIAGNOSIS — I10 Essential (primary) hypertension: Secondary | ICD-10-CM | POA: Diagnosis not present

## 2023-05-28 DIAGNOSIS — E790 Hyperuricemia without signs of inflammatory arthritis and tophaceous disease: Secondary | ICD-10-CM | POA: Diagnosis not present

## 2023-05-28 DIAGNOSIS — Z23 Encounter for immunization: Secondary | ICD-10-CM | POA: Diagnosis not present

## 2023-05-28 DIAGNOSIS — E1129 Type 2 diabetes mellitus with other diabetic kidney complication: Secondary | ICD-10-CM | POA: Diagnosis not present

## 2023-05-28 DIAGNOSIS — M1A09X0 Idiopathic chronic gout, multiple sites, without tophus (tophi): Secondary | ICD-10-CM | POA: Diagnosis not present

## 2023-05-28 DIAGNOSIS — R809 Proteinuria, unspecified: Secondary | ICD-10-CM | POA: Diagnosis not present

## 2023-05-28 DIAGNOSIS — E119 Type 2 diabetes mellitus without complications: Secondary | ICD-10-CM | POA: Diagnosis not present

## 2023-05-28 DIAGNOSIS — G629 Polyneuropathy, unspecified: Secondary | ICD-10-CM | POA: Diagnosis not present

## 2023-05-28 DIAGNOSIS — G894 Chronic pain syndrome: Secondary | ICD-10-CM | POA: Diagnosis not present

## 2023-06-12 ENCOUNTER — Other Ambulatory Visit: Payer: Self-pay | Admitting: Nurse Practitioner

## 2023-06-12 DIAGNOSIS — R748 Abnormal levels of other serum enzymes: Secondary | ICD-10-CM | POA: Diagnosis not present

## 2023-06-12 DIAGNOSIS — K760 Fatty (change of) liver, not elsewhere classified: Secondary | ICD-10-CM

## 2023-06-12 DIAGNOSIS — K7401 Hepatic fibrosis, early fibrosis: Secondary | ICD-10-CM | POA: Diagnosis not present

## 2023-06-12 DIAGNOSIS — K7402 Hepatic fibrosis, advanced fibrosis: Secondary | ICD-10-CM | POA: Diagnosis not present

## 2023-06-18 ENCOUNTER — Encounter: Payer: Self-pay | Admitting: Physical Medicine & Rehabilitation

## 2023-06-18 ENCOUNTER — Encounter: Payer: 59 | Attending: Physical Medicine & Rehabilitation | Admitting: Physical Medicine & Rehabilitation

## 2023-06-18 VITALS — BP 118/62 | HR 89 | Ht 73.0 in | Wt 237.0 lb

## 2023-06-18 DIAGNOSIS — G8111 Spastic hemiplegia affecting right dominant side: Secondary | ICD-10-CM | POA: Diagnosis not present

## 2023-06-18 MED ORDER — INCOBOTULINUMTOXINA 100 UNITS IM SOLR
100.0000 [IU] | Freq: Once | INTRAMUSCULAR | Status: DC
Start: 2023-06-18 — End: 2023-06-18

## 2023-06-18 MED ORDER — INCOBOTULINUMTOXINA 100 UNITS IM SOLR
400.0000 [IU] | Freq: Once | INTRAMUSCULAR | Status: AC
Start: 2023-06-18 — End: 2023-06-18
  Administered 2023-06-18: 400 [IU] via INTRAMUSCULAR

## 2023-06-18 MED ORDER — SODIUM CHLORIDE (PF) 0.9 % IJ SOLN
8.0000 mL | Freq: Once | INTRAMUSCULAR | Status: AC
  Administered 2023-06-18: 8 mL

## 2023-06-18 MED ORDER — SODIUM CHLORIDE (PF) 0.9 % IJ SOLN
8.0000 mL | Freq: Once | INTRAMUSCULAR | Status: DC
Start: 2023-06-18 — End: 2023-06-18

## 2023-06-18 NOTE — Patient Instructions (Signed)

## 2023-06-18 NOTE — Progress Notes (Signed)
Xeomin Injection for spasticity using needle EMG guidance  Dilution: 50 Units/ml Indication: Severe spasticity which interferes with ADL,mobility and/or  hygiene and is unresponsive to medication management and other conservative care Informed consent was obtained after describing risks and benefits of the procedure with the patient. This includes bleeding, bruising, infection, excessive weakness, or medication side effects. A REMS form is on file and signed. Needle: 25g 2" needle electrode-  Number of units per muscle Botox FCR50 FCU50 FDS 75 FDP50 Biceps 100 Brachialis 50U BR 25 PT 50

## 2023-06-21 ENCOUNTER — Ambulatory Visit
Admission: RE | Admit: 2023-06-21 | Discharge: 2023-06-21 | Disposition: A | Discharge status: Home / Self Care | Payer: 59 | Source: Ambulatory Visit | Attending: Nurse Practitioner

## 2023-06-21 DIAGNOSIS — K7402 Hepatic fibrosis, advanced fibrosis: Secondary | ICD-10-CM

## 2023-06-21 DIAGNOSIS — R945 Abnormal results of liver function studies: Secondary | ICD-10-CM | POA: Diagnosis not present

## 2023-06-21 DIAGNOSIS — K760 Fatty (change of) liver, not elsewhere classified: Secondary | ICD-10-CM

## 2023-06-27 DIAGNOSIS — K0510 Chronic gingivitis, plaque induced: Secondary | ICD-10-CM | POA: Diagnosis not present

## 2023-06-28 DIAGNOSIS — I639 Cerebral infarction, unspecified: Secondary | ICD-10-CM | POA: Diagnosis not present

## 2023-07-02 DIAGNOSIS — N178 Other acute kidney failure: Secondary | ICD-10-CM | POA: Diagnosis not present

## 2023-07-02 DIAGNOSIS — I10 Essential (primary) hypertension: Secondary | ICD-10-CM | POA: Diagnosis not present

## 2023-07-02 DIAGNOSIS — I672 Cerebral atherosclerosis: Secondary | ICD-10-CM | POA: Diagnosis not present

## 2023-07-02 DIAGNOSIS — I69351 Hemiplegia and hemiparesis following cerebral infarction affecting right dominant side: Secondary | ICD-10-CM | POA: Diagnosis not present

## 2023-07-02 DIAGNOSIS — N1831 Chronic kidney disease, stage 3a: Secondary | ICD-10-CM | POA: Diagnosis not present

## 2023-07-24 DIAGNOSIS — G8929 Other chronic pain: Secondary | ICD-10-CM | POA: Diagnosis not present

## 2023-07-29 DIAGNOSIS — I639 Cerebral infarction, unspecified: Secondary | ICD-10-CM | POA: Diagnosis not present

## 2023-07-30 DIAGNOSIS — G894 Chronic pain syndrome: Secondary | ICD-10-CM | POA: Diagnosis not present

## 2023-07-30 DIAGNOSIS — I10 Essential (primary) hypertension: Secondary | ICD-10-CM | POA: Diagnosis not present

## 2023-07-30 DIAGNOSIS — E1129 Type 2 diabetes mellitus with other diabetic kidney complication: Secondary | ICD-10-CM | POA: Diagnosis not present

## 2023-07-30 DIAGNOSIS — E1142 Type 2 diabetes mellitus with diabetic polyneuropathy: Secondary | ICD-10-CM | POA: Diagnosis not present

## 2023-07-30 DIAGNOSIS — M1A09X0 Idiopathic chronic gout, multiple sites, without tophus (tophi): Secondary | ICD-10-CM | POA: Diagnosis not present

## 2023-07-30 DIAGNOSIS — R809 Proteinuria, unspecified: Secondary | ICD-10-CM | POA: Diagnosis not present

## 2023-07-30 DIAGNOSIS — E790 Hyperuricemia without signs of inflammatory arthritis and tophaceous disease: Secondary | ICD-10-CM | POA: Diagnosis not present

## 2023-08-23 DIAGNOSIS — I639 Cerebral infarction, unspecified: Secondary | ICD-10-CM | POA: Diagnosis not present

## 2023-09-19 ENCOUNTER — Encounter: Payer: 59 | Admitting: Physical Medicine & Rehabilitation

## 2023-10-04 ENCOUNTER — Encounter: Payer: Self-pay | Admitting: Physical Medicine & Rehabilitation

## 2023-10-04 ENCOUNTER — Encounter: Payer: 59 | Attending: Physical Medicine & Rehabilitation | Admitting: Physical Medicine & Rehabilitation

## 2023-10-04 VITALS — BP 120/77 | HR 99 | Ht 73.0 in | Wt 236.0 lb

## 2023-10-04 DIAGNOSIS — G8111 Spastic hemiplegia affecting right dominant side: Secondary | ICD-10-CM | POA: Diagnosis present

## 2023-10-04 MED ORDER — ONABOTULINUMTOXINA 100 UNITS IJ SOLR
400.0000 [IU] | Freq: Once | INTRAMUSCULAR | Status: AC
  Administered 2023-10-04: 400 [IU] via INTRAMUSCULAR

## 2023-10-04 NOTE — Progress Notes (Signed)
Xeomin Injection for spasticity using needle EMG guidance  Dilution: 50 Units/ml Indication: Severe spasticity which interferes with ADL,mobility and/or  hygiene and is unresponsive to medication management and other conservative care Informed consent was obtained after describing risks and benefits of the procedure with the patient. This includes bleeding, bruising, infection, excessive weakness, or medication side effects. A REMS form is on file and signed. Needle: 25g 2" needle electrode-  Number of units per muscle Botox FCR50 FCU50 FDS 75 FDP50 Biceps 100 Brachialis 50U BR 25 PT 50

## 2023-10-04 NOTE — Patient Instructions (Addendum)

## 2023-12-11 ENCOUNTER — Other Ambulatory Visit: Payer: Self-pay | Admitting: Nurse Practitioner

## 2023-12-11 DIAGNOSIS — K760 Fatty (change of) liver, not elsewhere classified: Secondary | ICD-10-CM

## 2023-12-11 DIAGNOSIS — K7402 Hepatic fibrosis, advanced fibrosis: Secondary | ICD-10-CM

## 2023-12-18 ENCOUNTER — Ambulatory Visit
Admission: RE | Admit: 2023-12-18 | Discharge: 2023-12-18 | Disposition: A | Discharge status: Home / Self Care | Source: Ambulatory Visit | Attending: Nurse Practitioner | Admitting: Nurse Practitioner

## 2023-12-18 DIAGNOSIS — K7402 Hepatic fibrosis, advanced fibrosis: Secondary | ICD-10-CM

## 2023-12-18 DIAGNOSIS — K760 Fatty (change of) liver, not elsewhere classified: Secondary | ICD-10-CM

## 2024-01-02 ENCOUNTER — Encounter: Payer: 59 | Attending: Physical Medicine & Rehabilitation | Admitting: Physical Medicine & Rehabilitation

## 2024-01-02 ENCOUNTER — Encounter: Payer: Self-pay | Admitting: Physical Medicine & Rehabilitation

## 2024-01-02 VITALS — BP 106/71 | HR 85 | Temp 98.3°F | Ht 73.0 in | Wt 222.0 lb

## 2024-01-02 DIAGNOSIS — G8111 Spastic hemiplegia affecting right dominant side: Secondary | ICD-10-CM | POA: Insufficient documentation

## 2024-01-02 MED ORDER — ONABOTULINUMTOXINA 100 UNITS IJ SOLR
400.0000 [IU] | Freq: Once | INTRAMUSCULAR | Status: AC
  Administered 2024-01-02: 400 [IU] via INTRAMUSCULAR

## 2024-01-02 NOTE — Progress Notes (Signed)
 Xeomin  Injection for spasticity using needle EMG guidance  Dilution: 50 Units/ml Indication: Severe spasticity which interferes with ADL,mobility and/or  hygiene and is unresponsive to medication management and other conservative care Informed consent was obtained after describing risks and benefits of the procedure with the patient. This includes bleeding, bruising, infection, excessive weakness, or medication side effects. A REMS form is on file and signed. Needle: 25g 2" needle electrode-  Number of units per muscle Botox  FCR50 FCU50 FDS 25 FDP50 Biceps 100 Brachialis 50U BR 25 Pron T 50  Pt tolerated procedure well, plan to repeat injection in 3months Post procedure instructions printed

## 2024-01-02 NOTE — Patient Instructions (Addendum)
 You received a Xeomin injection today. You may experience soreness at the needle injection sites. Please call us if any of the injection sites turns red after a couple days or if there is any drainage. You may experience muscle weakness as a result of Xeomin This would improve with time but can take several weeks to improve. The Xeomin should start working in about one week. The Xeomin usually last 3 months. The injection can be repeated every 3 months as needed.

## 2024-01-06 ENCOUNTER — Telehealth: Payer: Self-pay

## 2024-01-06 NOTE — Progress Notes (Signed)
   01/06/2024  Patient ID: Randall Mccall, male   DOB: 04-Sep-1970, 53 y.o.   MRN: [id number]  Contacted patient regarding referral for medication adherence from a quality report for [facility].   Left patient a voicemail to return my call at their convenience  Livia Riffle, PharmD Clinical Pharmacist  [phone]

## 2024-01-10 NOTE — Progress Notes (Signed)
   01/10/2024  Patient ID: Randall Mccall, male   DOB: 29-Mar-1971, 53 y.o.   MRN: [id number]  Contacted patient regarding referral for medication adherence from a quality report for [facility].   Patient authorized refills of Amlodipine -Benazepril 10-20 mg and Atorvastatin  80 mg. I assisted in calling refills in. No other pharmacy needs reported  at this time.   Livia Riffle, PharmD Clinical Pharmacist  [phone]

## 2024-04-03 ENCOUNTER — Encounter: Attending: Physical Medicine & Rehabilitation | Admitting: Physical Medicine & Rehabilitation

## 2024-04-03 ENCOUNTER — Encounter: Payer: Self-pay | Admitting: Physical Medicine & Rehabilitation

## 2024-04-03 VITALS — BP 112/79 | HR 86 | Ht 73.0 in | Wt 214.0 lb

## 2024-04-03 DIAGNOSIS — G8111 Spastic hemiplegia affecting right dominant side: Secondary | ICD-10-CM | POA: Insufficient documentation

## 2024-04-03 MED ORDER — INCOBOTULINUMTOXINA 100 UNITS IM SOLR
400.0000 [IU] | Freq: Once | INTRAMUSCULAR | Status: AC
  Administered 2024-04-03: 400 [IU] via INTRAMUSCULAR

## 2024-04-03 MED ORDER — SODIUM CHLORIDE (PF) 0.9 % IJ SOLN
8.0000 mL | Freq: Once | INTRAMUSCULAR | Status: AC
  Administered 2024-04-03: 8 mL

## 2024-04-03 NOTE — Patient Instructions (Addendum)
 You received a Xeomin injection today. You may experience soreness at the needle injection sites. Please call us if any of the injection sites turns red after a couple days or if there is any drainage. You may experience muscle weakness as a result of Xeomin This would improve with time but can take several weeks to improve. The Xeomin should start working in about one week. The Xeomin usually last 3 months. The injection can be repeated every 3 months as needed.

## 2024-04-03 NOTE — Progress Notes (Signed)
 Xeomin  Injection for spasticity using needle EMG guidance  Dilution: 50 Units/ml Indication: Severe spasticity which interferes with ADL,mobility and/or  hygiene and is unresponsive to medication management and other conservative care Informed consent was obtained after describing risks and benefits of the procedure with the patient. This includes bleeding, bruising, infection, excessive weakness, or medication side effects. A REMS form is on file and signed. Needle: 25g 2 needle electrode-  Number of units per muscle Xeomin  FCR50 FCU50 FDS 25 FDP50 Biceps 100 Brachialis 50U BR 25 Pron T 50  Pt tolerated procedure well, plan to repeat injection in 3months Post procedure instructions printed

## 2024-06-29 ENCOUNTER — Telehealth: Payer: Self-pay

## 2024-06-29 NOTE — Progress Notes (Signed)
   06/29/2024  Patient ID: Randall Mccall, male   DOB: Feb 16, 1971, 53 y.o.   MRN: [id number]  Contacted patient regarding medication adherence from a quality report for [facility].   Left patient a voicemail to return my call at their convenience  Heather Factor, PharmD Clinical Pharmacist  [phone]

## 2024-07-07 ENCOUNTER — Encounter: Payer: Self-pay | Admitting: Physical Medicine & Rehabilitation

## 2024-07-07 ENCOUNTER — Encounter: Attending: Physical Medicine & Rehabilitation | Admitting: Physical Medicine & Rehabilitation

## 2024-07-07 VITALS — BP 113/77 | HR 80 | Ht 73.0 in | Wt 218.0 lb

## 2024-07-07 DIAGNOSIS — G8111 Spastic hemiplegia affecting right dominant side: Secondary | ICD-10-CM | POA: Insufficient documentation

## 2024-07-07 MED ORDER — ONABOTULINUMTOXINA 100 UNITS IJ SOLR
400.0000 [IU] | Freq: Once | INTRAMUSCULAR | Status: AC
  Administered 2024-07-07: 400 [IU] via INTRAMUSCULAR

## 2024-07-07 MED ORDER — SODIUM CHLORIDE (PF) 0.9 % IJ SOLN
8.0000 mL | Freq: Once | INTRAMUSCULAR | Status: AC
  Administered 2024-07-07: 8 mL

## 2024-07-07 NOTE — Progress Notes (Signed)
 Botox  Injection for spasticity using needle EMG guidance  Dilution: 50 Units/ml Indication: Severe spasticity which interferes with ADL,mobility and/or  hygiene and is unresponsive to medication management and other conservative care Informed consent was obtained after describing risks and benefits of the procedure with the patient. This includes bleeding, bruising, infection, excessive weakness, or medication side effects. A REMS form is on file and signed. Needle: 25g 2 needle electrode-  Number of units per muscle Botox  FCR50 FCU50 FDS 25 FDP50 Biceps 100 Brachialis 50U BR 25 Pron T 50  Pt tolerated procedure well, plan to repeat injection in 3months Post procedure instructions printed

## 2024-07-28 ENCOUNTER — Telehealth: Payer: Self-pay

## 2024-07-28 NOTE — Progress Notes (Signed)
   07/28/2024  Patient ID: Randall Mccall, male   DOB: 1971/06/02, 53 y.o.   MRN: [id number]  Contacted patient regarding referral for medication adherence from a quality report for [facility].    Patient authorized refills of Amlodipine -Benazepril 10-20 mg and Atorvastatin  80 mg. I assisted in calling refills in. No other pharmacy needs reported  at this time.    Heather Factor, PharmD Clinical Pharmacist  [phone]

## 2025-01-05 ENCOUNTER — Encounter: Admitting: Physical Medicine & Rehabilitation
